# Patient Record
Sex: Male | Born: 1970 | Race: White | Hispanic: No | Marital: Married | Smoking: Never smoker
Health system: Southern US, Community
[De-identification: ages and names within clinical notes are randomized; demographics above are authoritative.]

## PROBLEM LIST (undated history)

## (undated) DIAGNOSIS — C7951 Secondary malignant neoplasm of bone: Secondary | ICD-10-CM

## (undated) DIAGNOSIS — C7931 Secondary malignant neoplasm of brain: Principal | ICD-10-CM

## (undated) DIAGNOSIS — K219 Gastro-esophageal reflux disease without esophagitis: Secondary | ICD-10-CM

## (undated) DIAGNOSIS — C7949 Secondary malignant neoplasm of other parts of nervous system: Principal | ICD-10-CM

---

## 2004-05-06 ENCOUNTER — Encounter: Admission: RE | Admit: 2004-05-06 | Discharge: 2004-05-06 | Payer: Self-pay | Admitting: Emergency Medicine

## 2007-02-20 ENCOUNTER — Ambulatory Visit: Payer: Self-pay | Admitting: Psychology

## 2007-03-01 ENCOUNTER — Ambulatory Visit: Payer: Self-pay | Admitting: Psychology

## 2007-03-14 ENCOUNTER — Ambulatory Visit: Payer: Self-pay | Admitting: Psychology

## 2007-03-30 ENCOUNTER — Ambulatory Visit: Payer: Self-pay | Admitting: Psychology

## 2007-04-18 ENCOUNTER — Ambulatory Visit: Payer: Self-pay | Admitting: Psychology

## 2007-04-24 ENCOUNTER — Ambulatory Visit: Payer: Self-pay | Admitting: Psychology

## 2007-05-11 ENCOUNTER — Ambulatory Visit: Payer: Self-pay | Admitting: Psychology

## 2007-05-25 ENCOUNTER — Ambulatory Visit: Payer: Self-pay | Admitting: Psychology

## 2007-06-08 ENCOUNTER — Ambulatory Visit: Payer: Self-pay | Admitting: Psychology

## 2007-06-29 ENCOUNTER — Ambulatory Visit: Payer: Self-pay | Admitting: Psychology

## 2007-07-20 ENCOUNTER — Ambulatory Visit: Payer: Self-pay | Admitting: Psychology

## 2007-08-03 ENCOUNTER — Ambulatory Visit: Payer: Self-pay | Admitting: Psychology

## 2007-08-31 ENCOUNTER — Ambulatory Visit: Payer: Self-pay | Admitting: Psychology

## 2007-09-28 ENCOUNTER — Ambulatory Visit: Payer: Self-pay | Admitting: Psychology

## 2007-10-12 ENCOUNTER — Ambulatory Visit: Payer: Self-pay | Admitting: Psychology

## 2007-10-26 ENCOUNTER — Ambulatory Visit: Payer: Self-pay | Admitting: Psychology

## 2007-11-08 ENCOUNTER — Ambulatory Visit: Payer: Self-pay | Admitting: Psychology

## 2007-12-07 ENCOUNTER — Ambulatory Visit: Payer: Self-pay | Admitting: Psychology

## 2008-01-02 ENCOUNTER — Ambulatory Visit: Payer: Self-pay | Admitting: Psychology

## 2008-01-18 ENCOUNTER — Ambulatory Visit: Payer: Self-pay | Admitting: Psychology

## 2008-02-28 ENCOUNTER — Ambulatory Visit: Payer: Self-pay | Admitting: Psychology

## 2008-03-28 ENCOUNTER — Ambulatory Visit: Payer: Self-pay | Admitting: Psychology

## 2008-04-09 ENCOUNTER — Ambulatory Visit: Payer: Self-pay | Admitting: Psychology

## 2008-04-25 ENCOUNTER — Ambulatory Visit: Payer: Self-pay | Admitting: Psychology

## 2008-05-09 ENCOUNTER — Ambulatory Visit: Payer: Self-pay | Admitting: Psychology

## 2008-05-21 ENCOUNTER — Ambulatory Visit: Payer: Self-pay | Admitting: Psychology

## 2008-06-06 ENCOUNTER — Ambulatory Visit: Payer: Self-pay | Admitting: Psychology

## 2008-07-04 ENCOUNTER — Ambulatory Visit: Payer: Self-pay | Admitting: Psychology

## 2008-07-18 ENCOUNTER — Ambulatory Visit: Payer: Self-pay | Admitting: Psychology

## 2008-08-29 ENCOUNTER — Ambulatory Visit: Payer: Self-pay | Admitting: Psychology

## 2008-10-24 ENCOUNTER — Ambulatory Visit: Payer: Self-pay | Admitting: Psychology

## 2009-01-18 ENCOUNTER — Ambulatory Visit (HOSPITAL_COMMUNITY): Admission: RE | Admit: 2009-01-18 | Discharge: 2009-01-18 | Payer: Self-pay | Admitting: Emergency Medicine

## 2009-06-05 ENCOUNTER — Ambulatory Visit: Payer: Self-pay | Admitting: Psychology

## 2009-06-19 ENCOUNTER — Ambulatory Visit: Payer: Self-pay | Admitting: Psychology

## 2009-07-28 ENCOUNTER — Ambulatory Visit: Payer: Self-pay | Admitting: Psychology

## 2009-08-07 ENCOUNTER — Ambulatory Visit: Payer: Self-pay | Admitting: Psychology

## 2009-08-21 ENCOUNTER — Ambulatory Visit: Payer: Self-pay | Admitting: Psychology

## 2009-09-11 ENCOUNTER — Ambulatory Visit: Payer: Self-pay | Admitting: Psychology

## 2009-09-25 ENCOUNTER — Ambulatory Visit: Payer: Self-pay | Admitting: Psychology

## 2009-10-23 ENCOUNTER — Ambulatory Visit: Payer: Self-pay | Admitting: Psychology

## 2009-11-06 ENCOUNTER — Ambulatory Visit: Payer: Self-pay | Admitting: Psychology

## 2009-12-04 ENCOUNTER — Ambulatory Visit: Payer: Self-pay | Admitting: Psychology

## 2009-12-18 ENCOUNTER — Ambulatory Visit: Payer: Self-pay | Admitting: Psychology

## 2010-01-15 ENCOUNTER — Ambulatory Visit: Admit: 2010-01-15 | Payer: Self-pay | Admitting: Psychology

## 2010-01-29 ENCOUNTER — Ambulatory Visit
Admission: RE | Admit: 2010-01-29 | Discharge: 2010-01-29 | Payer: Self-pay | Source: Home / Self Care | Attending: Psychology | Admitting: Psychology

## 2010-02-12 ENCOUNTER — Ambulatory Visit (INDEPENDENT_AMBULATORY_CARE_PROVIDER_SITE_OTHER): Payer: PRIVATE HEALTH INSURANCE | Admitting: Psychology

## 2010-02-12 DIAGNOSIS — F4320 Adjustment disorder, unspecified: Secondary | ICD-10-CM

## 2010-02-26 ENCOUNTER — Ambulatory Visit (INDEPENDENT_AMBULATORY_CARE_PROVIDER_SITE_OTHER): Payer: PRIVATE HEALTH INSURANCE | Admitting: Psychology

## 2010-02-26 DIAGNOSIS — F4320 Adjustment disorder, unspecified: Secondary | ICD-10-CM

## 2010-03-12 ENCOUNTER — Ambulatory Visit (INDEPENDENT_AMBULATORY_CARE_PROVIDER_SITE_OTHER): Payer: PRIVATE HEALTH INSURANCE | Admitting: Psychology

## 2010-03-12 DIAGNOSIS — F4320 Adjustment disorder, unspecified: Secondary | ICD-10-CM

## 2010-03-26 ENCOUNTER — Ambulatory Visit: Payer: PRIVATE HEALTH INSURANCE | Admitting: Psychology

## 2010-04-23 ENCOUNTER — Ambulatory Visit (INDEPENDENT_AMBULATORY_CARE_PROVIDER_SITE_OTHER): Payer: PRIVATE HEALTH INSURANCE | Admitting: Psychology

## 2010-04-23 DIAGNOSIS — F4320 Adjustment disorder, unspecified: Secondary | ICD-10-CM

## 2010-05-07 ENCOUNTER — Ambulatory Visit (INDEPENDENT_AMBULATORY_CARE_PROVIDER_SITE_OTHER): Payer: PRIVATE HEALTH INSURANCE | Admitting: Psychology

## 2010-05-07 DIAGNOSIS — F4320 Adjustment disorder, unspecified: Secondary | ICD-10-CM

## 2010-06-04 ENCOUNTER — Ambulatory Visit (INDEPENDENT_AMBULATORY_CARE_PROVIDER_SITE_OTHER): Payer: PRIVATE HEALTH INSURANCE | Admitting: Psychology

## 2010-06-04 ENCOUNTER — Ambulatory Visit: Payer: PRIVATE HEALTH INSURANCE | Admitting: Psychology

## 2010-06-04 DIAGNOSIS — F4320 Adjustment disorder, unspecified: Secondary | ICD-10-CM

## 2010-06-18 ENCOUNTER — Ambulatory Visit: Payer: PRIVATE HEALTH INSURANCE | Admitting: Psychology

## 2010-07-02 ENCOUNTER — Ambulatory Visit: Payer: PRIVATE HEALTH INSURANCE | Admitting: Psychology

## 2010-07-16 ENCOUNTER — Ambulatory Visit (INDEPENDENT_AMBULATORY_CARE_PROVIDER_SITE_OTHER): Payer: PRIVATE HEALTH INSURANCE | Admitting: Psychology

## 2010-07-16 DIAGNOSIS — F4320 Adjustment disorder, unspecified: Secondary | ICD-10-CM

## 2010-07-30 ENCOUNTER — Ambulatory Visit: Payer: PRIVATE HEALTH INSURANCE | Admitting: Psychology

## 2010-08-06 ENCOUNTER — Ambulatory Visit (INDEPENDENT_AMBULATORY_CARE_PROVIDER_SITE_OTHER): Payer: PRIVATE HEALTH INSURANCE | Admitting: Psychology

## 2010-08-06 DIAGNOSIS — F4320 Adjustment disorder, unspecified: Secondary | ICD-10-CM

## 2010-08-13 ENCOUNTER — Ambulatory Visit: Payer: PRIVATE HEALTH INSURANCE | Admitting: Psychology

## 2010-08-20 ENCOUNTER — Ambulatory Visit (INDEPENDENT_AMBULATORY_CARE_PROVIDER_SITE_OTHER): Payer: PRIVATE HEALTH INSURANCE | Admitting: Psychology

## 2010-08-20 DIAGNOSIS — F4320 Adjustment disorder, unspecified: Secondary | ICD-10-CM

## 2010-08-27 ENCOUNTER — Ambulatory Visit: Payer: PRIVATE HEALTH INSURANCE | Admitting: Psychology

## 2010-09-17 ENCOUNTER — Ambulatory Visit: Payer: PRIVATE HEALTH INSURANCE | Admitting: Psychology

## 2010-11-05 ENCOUNTER — Ambulatory Visit: Payer: Self-pay | Admitting: Psychology

## 2010-11-19 ENCOUNTER — Ambulatory Visit: Payer: PRIVATE HEALTH INSURANCE | Admitting: Psychology

## 2010-12-03 ENCOUNTER — Ambulatory Visit: Payer: PRIVATE HEALTH INSURANCE | Admitting: Psychology

## 2013-01-21 ENCOUNTER — Other Ambulatory Visit: Payer: Self-pay | Admitting: Family Medicine

## 2013-01-21 ENCOUNTER — Ambulatory Visit
Admission: RE | Admit: 2013-01-21 | Discharge: 2013-01-21 | Disposition: A | Discharge status: Home / Self Care | Payer: BC Managed Care – PPO | Source: Ambulatory Visit | Attending: Family Medicine | Admitting: Family Medicine

## 2013-01-21 DIAGNOSIS — M549 Dorsalgia, unspecified: Secondary | ICD-10-CM

## 2013-01-23 ENCOUNTER — Other Ambulatory Visit: Payer: Self-pay | Admitting: Family Medicine

## 2013-01-23 DIAGNOSIS — M549 Dorsalgia, unspecified: Secondary | ICD-10-CM

## 2013-01-28 ENCOUNTER — Ambulatory Visit
Admission: RE | Admit: 2013-01-28 | Discharge: 2013-01-28 | Disposition: A | Discharge status: Home / Self Care | Payer: BC Managed Care – PPO | Source: Ambulatory Visit | Attending: Family Medicine | Admitting: Family Medicine

## 2013-01-28 DIAGNOSIS — M549 Dorsalgia, unspecified: Secondary | ICD-10-CM

## 2013-01-29 ENCOUNTER — Encounter: Payer: Self-pay | Admitting: *Deleted

## 2013-01-29 ENCOUNTER — Other Ambulatory Visit (HOSPITAL_COMMUNITY): Payer: Self-pay | Admitting: Family Medicine

## 2013-01-29 DIAGNOSIS — C799 Secondary malignant neoplasm of unspecified site: Secondary | ICD-10-CM

## 2013-01-29 NOTE — Progress Notes (Signed)
Received referral on pt today.  Looked at CT scan and notified Dr. Julien Nordmann.  He stated pt needed to be seen soon.  I spoke with Rad Onc, Dr. [name] and he wanted to see pt at [facility].  Dr. [name] requested MRI Thoracic spine be scheduled asap.  I called [name] and was able to get scan 2 days earlier.  Dr. Julien Nordmann wanted PA to order Decadron and no heavy lifting for pt.  Message relayed.

## 2013-01-30 ENCOUNTER — Other Ambulatory Visit: Payer: Self-pay | Admitting: Radiation Therapy

## 2013-01-30 ENCOUNTER — Telehealth: Payer: Self-pay | Admitting: *Deleted

## 2013-01-30 ENCOUNTER — Other Ambulatory Visit: Payer: Self-pay | Admitting: Radiology

## 2013-01-30 ENCOUNTER — Other Ambulatory Visit: Payer: Self-pay | Admitting: Internal Medicine

## 2013-01-30 DIAGNOSIS — K769 Liver disease, unspecified: Secondary | ICD-10-CM

## 2013-01-30 DIAGNOSIS — C7931 Secondary malignant neoplasm of brain: Secondary | ICD-10-CM

## 2013-01-30 DIAGNOSIS — C7949 Secondary malignant neoplasm of other parts of nervous system: Principal | ICD-10-CM

## 2013-01-30 NOTE — Telephone Encounter (Signed)
Called [facility] and was able to move pt appt for MRI thoracic spine to an earlier appt per request of Mont Dutton, radiation navigator.  Appt set for Sat 02/02/13 at 10:45 arrive at 10:15.  He verbalized understanding of time and location of appt.   Also, clarified appt for [facility] 01/31/13 to see Dr. Julien Nordmann and Dr. [name].  He verbalized understanding of time and place of appt.

## 2013-01-31 ENCOUNTER — Encounter (HOSPITAL_COMMUNITY): Payer: Self-pay | Admitting: Pharmacy Technician

## 2013-01-31 ENCOUNTER — Telehealth: Payer: Self-pay | Admitting: Oncology

## 2013-01-31 ENCOUNTER — Ambulatory Visit (HOSPITAL_BASED_OUTPATIENT_CLINIC_OR_DEPARTMENT_OTHER): Payer: BC Managed Care – PPO | Admitting: Internal Medicine

## 2013-01-31 ENCOUNTER — Encounter: Payer: Self-pay | Admitting: Internal Medicine

## 2013-01-31 ENCOUNTER — Encounter (HOSPITAL_COMMUNITY): Payer: Self-pay

## 2013-01-31 ENCOUNTER — Ambulatory Visit: Payer: BC Managed Care – PPO | Admitting: Physical Therapy

## 2013-01-31 ENCOUNTER — Ambulatory Visit
Admission: RE | Admit: 2013-01-31 | Discharge: 2013-01-31 | Disposition: A | Discharge status: Home / Self Care | Payer: No Typology Code available for payment source | Source: Ambulatory Visit | Attending: Family Medicine | Admitting: Family Medicine

## 2013-01-31 ENCOUNTER — Ambulatory Visit (HOSPITAL_COMMUNITY)
Admission: RE | Admit: 2013-01-31 | Discharge: 2013-01-31 | Disposition: A | Discharge status: Home / Self Care | Payer: BC Managed Care – PPO | Source: Ambulatory Visit | Attending: Radiation Oncology | Admitting: Radiation Oncology

## 2013-01-31 ENCOUNTER — Ambulatory Visit
Admission: RE | Admit: 2013-01-31 | Discharge: 2013-01-31 | Disposition: A | Discharge status: Home / Self Care | Payer: PRIVATE HEALTH INSURANCE | Source: Ambulatory Visit | Attending: Radiation Oncology | Admitting: Radiation Oncology

## 2013-01-31 ENCOUNTER — Encounter: Payer: Self-pay | Admitting: Radiation Oncology

## 2013-01-31 VITALS — BP 158/94 | HR 121 | Temp 97.7°F | Resp 18 | Ht 71.0 in | Wt 282.3 lb

## 2013-01-31 DIAGNOSIS — M546 Pain in thoracic spine: Secondary | ICD-10-CM | POA: Insufficient documentation

## 2013-01-31 DIAGNOSIS — R599 Enlarged lymph nodes, unspecified: Secondary | ICD-10-CM

## 2013-01-31 DIAGNOSIS — C155 Malignant neoplasm of lower third of esophagus: Secondary | ICD-10-CM | POA: Insufficient documentation

## 2013-01-31 DIAGNOSIS — M949 Disorder of cartilage, unspecified: Principal | ICD-10-CM

## 2013-01-31 DIAGNOSIS — C799 Secondary malignant neoplasm of unspecified site: Secondary | ICD-10-CM

## 2013-01-31 DIAGNOSIS — C7949 Secondary malignant neoplasm of other parts of nervous system: Secondary | ICD-10-CM

## 2013-01-31 DIAGNOSIS — C787 Secondary malignant neoplasm of liver and intrahepatic bile duct: Secondary | ICD-10-CM | POA: Insufficient documentation

## 2013-01-31 DIAGNOSIS — C7951 Secondary malignant neoplasm of bone: Secondary | ICD-10-CM | POA: Insufficient documentation

## 2013-01-31 DIAGNOSIS — R911 Solitary pulmonary nodule: Secondary | ICD-10-CM | POA: Insufficient documentation

## 2013-01-31 DIAGNOSIS — R918 Other nonspecific abnormal finding of lung field: Secondary | ICD-10-CM

## 2013-01-31 DIAGNOSIS — C801 Malignant (primary) neoplasm, unspecified: Secondary | ICD-10-CM

## 2013-01-31 DIAGNOSIS — M899 Disorder of bone, unspecified: Secondary | ICD-10-CM | POA: Insufficient documentation

## 2013-01-31 DIAGNOSIS — K449 Diaphragmatic hernia without obstruction or gangrene: Secondary | ICD-10-CM | POA: Insufficient documentation

## 2013-01-31 DIAGNOSIS — K7689 Other specified diseases of liver: Secondary | ICD-10-CM

## 2013-01-31 DIAGNOSIS — R609 Edema, unspecified: Secondary | ICD-10-CM

## 2013-01-31 DIAGNOSIS — M549 Dorsalgia, unspecified: Secondary | ICD-10-CM

## 2013-01-31 DIAGNOSIS — C7931 Secondary malignant neoplasm of brain: Secondary | ICD-10-CM

## 2013-01-31 DIAGNOSIS — C7800 Secondary malignant neoplasm of unspecified lung: Secondary | ICD-10-CM

## 2013-01-31 DIAGNOSIS — C7952 Secondary malignant neoplasm of bone marrow: Secondary | ICD-10-CM

## 2013-01-31 HISTORY — DX: Secondary malignant neoplasm of brain: C79.31

## 2013-01-31 HISTORY — DX: Secondary malignant neoplasm of other parts of nervous system: C79.49

## 2013-01-31 MED ORDER — GADOBENATE DIMEGLUMINE 529 MG/ML IV SOLN
20.0000 mL | Freq: Once | INTRAVENOUS | Status: AC | PRN
  Administered 2013-01-31: 20 mL via INTRAVENOUS

## 2013-01-31 MED ORDER — OXYCODONE-ACETAMINOPHEN 5-325 MG PO TABS: 1.0000 | ORAL_TABLET | Freq: Four times a day (QID) | ORAL | Status: DC | PRN

## 2013-01-31 MED ORDER — IOHEXOL 300 MG/ML  SOLN
80.0000 mL | Freq: Once | INTRAMUSCULAR | Status: AC | PRN
  Administered 2013-01-31: 80 mL via INTRAVENOUS

## 2013-01-31 NOTE — Progress Notes (Signed)
[place] [phone]   [phone] Multidisciplinary thoracic oncology clinic ([place])  CONSULT NOTE  REFERRING PHYSICIAN: Dr. Jonathon Jordan  REASON FOR CONSULTATION:  43 years old white male with questionable metastatic carcinoma.  HPI Fred [name] is a 43 y.o. male with past medical history significant for depression/anxiety, history of left breast gynecomastia an ultrasound in 2012 showed benign features and currently resolved, history of kidney stone as well as the degenerative disc disease. The patient has been complaining of back pain for a month which gets worse when he bent over to tie his shoe. He was seen by his primary care physician and was treated with NSAIDs and the steroid shots with minimal improvement. X-ray of the thoracolumbar spine showed degenerative disc disease and questionable renal stones. He had CT of the abdomen and pelvis performed on 01/28/2013 for evaluation of the kidney stones. It showed lytic mass in the T9 vertebral body with mild associated pathologic compression most consistent with metastatic disease. There was also pulmonary nodules in the lower lobes and low attenuation lesion in the liver that are new from 01/18/2005 and worrisome for metastatic disease. The patient was referred to the multidisciplinary thoracic oncology clinic today for further evaluation. Will order CT scan of the chest as well as MRI of the thoracic spine which were performed earlier today. CT scan of the chest on 01/31/2013 showed pathologically enlarged mediastinal lymph nodes measuring up to 1.8 CM and the prevascular/AP window station. There was also a left hilar node measuring 1.2 CM. There appears to be an eccentric  nodular lesion in the 7-8 o'clock position of the lower esophagus, measuring approximately 1.5 x 1.8 cm with some associated eccentric thickening of the esophageal wall. There was also a vague area of groundglass nodularity in the  lingula measuring 1.1 x 1.3 CM. There are 2 nodular densities in the left lower lobe measuring up to 0.9 x 1.1 CM and a tiny nodular density along the right hemidiaphragm. Incidental imaging of the upper abdomen shows at least 4 heterogeneous low-attenuation lesions in the dome of the liver and left hepatic lobe, measuring up to approximately 1.6 x 2.4 cm. Gastrohepatic ligament lymph node measures 10 mm (3 mm on 01/18/2009). A lytic mass in the right aspect of T9 measures approximately 3.0 x 4.0 cm and encroaches posteriorly on the spinal canal. Mild associated pathologic compression. MRI of the thoracic spine performed on 01/31/2013 showed a destructive soft tissue mass involving the right half of the T9 vertebral body and right pedicle. This mass measures 4.3 x 2.8 by 2.1 cm (AP by transverse by craniocaudal). This mass disrupts the cortex laterally and extends into the paravertebral tissues and also disrupts the cortex posteriorly extending into the spinal canal. There is at least moderate spinal stenosis at this level with impression on the right ventrolateral spinal cord. No abnormal spinal cord signal is present to suggest  Edema. There was also abnormal marrow signal and enhancement in the right T5 transverse process, also concerning for metastasis. The patient is here today for evaluation and discussion of his condition and treatment options. He was very anxious today and still complaining of back pain rated as 4 on a scale from 1-10. He also lost around 9 pounds in the last few weeks. He denied having any significant chest pain, shortness breath, cough or hemoptysis. He denied having any headache or blurry vision.  PAST MEDICAL HISTORY: depression/anxiety, gynecomastia left breast, obesity, kidney stone, and degenerative  disc disease. The patient denied having any history of diabetes mellitus, hypertension, coronary artery disease or stroke.  FAMILY HISTORY: Family history significant for a mother  who was diagnosed with dementia and died at age 46. Father is still alive and has peripheral vascular disease. There is no family history of malignancy.  SOCIAL HISTORY: The patient is married and has one daughter age 58. He was accompanied by his wife, Fred [name], today. 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 He is a never smoker but chewed tobacco and quit recently. She has no history of drug abuse but drinks alcohol occasionally.   HPI  No Known Allergies  Current Outpatient Prescriptions  Medication Sig Dispense Refill  . dexamethasone (DECADRON) 4 MG tablet Take 4 mg by mouth 4 (four) times daily.      Marland Kitchen tiZANidine (ZANAFLEX) 4 MG tablet Take 4 mg by mouth every 6 (six) hours as needed for muscle spasms.      Marland Kitchen ibuprofen (ADVIL,MOTRIN) 200 MG tablet Take 200 mg by mouth every 6 (six) hours as needed.      . naproxen sodium (ANAPROX) 220 MG tablet Take 220 mg by mouth 2 (two) times daily as needed (pain).      Marland Kitchen oxyCODONE-acetaminophen (PERCOCET/ROXICET) 5-325 MG per tablet Take 1 tablet by mouth every 6 (six) hours as needed for severe pain.  60 tablet  0   No current facility-administered medications for this visit.    Review of Systems  Constitutional: negative Eyes: negative Ears, nose, mouth, throat, and face: negative Respiratory: negative Cardiovascular: negative Gastrointestinal: negative Genitourinary:negative Integument/breast: negative Hematologic/lymphatic: negative Musculoskeletal:positive for back pain Neurological: negative Behavioral/Psych: negative Endocrine: negative Allergic/Immunologic: negative  Physical Exam  EGB:TDVVO, healthy, no distress, well nourished, well developed and anxious SKIN: skin color, texture, turgor are normal, no rashes or significant lesions HEAD: Normocephalic, No masses, lesions, tenderness or abnormalities EYES: normal, PERRLA EARS: External ears normal OROPHARYNX:no exudate, no erythema and  lips, buccal mucosa, and tongue normal  NECK: supple, no adenopathy, no JVD LYMPH:  no palpable lymphadenopathy, no hepatosplenomegaly LUNGS: clear to auscultation , and palpation HEART: regular rate & rhythm, no murmurs and no gallops ABDOMEN:abdomen soft, non-tender, obese, normal bowel sounds and no masses or organomegaly BACK: Back symmetric, no curvature., No CVA tenderness EXTREMITIES:no joint deformities, effusion, or inflammation, no edema, no skin discoloration  NEURO: alert & oriented x 3 with fluent speech, no focal motor/sensory deficits  PERFORMANCE STATUS: ECOG 1  LABORATORY DATA: No results found for this basename: WBC, HGB, HCT, MCV, PLT      Chemistry   No results found for this basename: NA, K, CL, CO2, BUN, CREATININE, GLU   No results found for this basename: CALCIUM, ALKPHOS, AST, ALT, BILITOT       RADIOGRAPHIC STUDIES: Ct Abdomen Pelvis Wo Contrast  01/28/2013   CLINICAL DATA:  Right-sided back pain. Renal stones questioned on recent lumbar spine series.  EXAM: CT ABDOMEN AND PELVIS WITHOUT CONTRAST  TECHNIQUE: Multidetector CT imaging of the abdomen and pelvis was performed following the standard protocol without intravenous contrast.  COMPARISON:  DG LUMBAR SPINE COMPLETE dated 01/21/2013; CT ANGIO CHEST W/CM &/OR WO/CM dated 01/18/2009  FINDINGS: Lung bases show a 5 mm subpleural nodule in the medial right lower lobe (image 8) and a 10 mm nodule in the left lower lobe (image 5), new from 01/18/2009. Heart size normal. No pericardial or pleural effusion. Small hiatal hernia with possible associated thickening. Difficult to exclude  a mass (example image 9).  There is an ovoid area of low attenuation in the dome of the liver, measuring 1.8 x 3.8 cm (image 14), not well seen on 01/18/2009. Liver, gallbladder and adrenal glands are otherwise unremarkable. Small stones are seen in the lower pole right kidney. Prominent extrarenal pelvis and mild caliectasis on the right.  There is abrupt transition to normal caliber right ureter at the ureteropelvic junction (coronal image 75), where there appears to be a crossing vessel (coronal image 73). Kidneys, spleen, pancreas, stomach and bowel, including appendix are unremarkable.  Gastrohepatic ligament lymph nodes measure up to 11 mm (image 18). Additional scattered lymph nodes in the abdomen and pelvis are sub cm in short axis size. Prostate is normal in size. Small bilateral inguinal hernias contain fat. No free fluid.  There is a lytic lesion in the lateral aspect of the T9 vertebral body, measuring approximately 2.9 x 3.9 cm. Slight compression of the lateral aspect of the T9 vertebral body in association (coronal image 97 and sagittal image 86). No additional worrisome lytic or sclerotic lesions.  IMPRESSION: 1. Lytic mass in the T9 vertebral body with mild associated pathologic compression. Findings correlate with the patient's history of right sided back pain and are most consistent with metastatic disease. 2. Pulmonary nodules in the lower lobes and a low-attenuation lesion in the liver are new from 01/18/2009, also worrisome for metastatic disease. 3. Small hiatal hernia with wall thickening. Difficult to exclude a mass. Borderline enlarged gastrohepatic ligament lymph node. 4. These results were called by telephone at the time of interpretation on 01/28/2013 at 2:54 PM to Dr. Jonathon Jordan , who verbally acknowledged these results. 5. Right renal stones with right pelvocaliectasis. Abrupt transition to normal caliber proximal right ureter may be due to a crossing vessel. No perinephric stranding to indicate acute obstruction. 6. Small bilateral inguinal hernias contain fat.   Electronically Signed   By: Lorin Picket M.D.   On: 01/28/2013 14:54   Dg Thoracic Spine W/swimmers  01/21/2013   CLINICAL DATA:  Back pain.  EXAM: THORACIC SPINE - 2 VIEW + SWIMMERS  COMPARISON:  None.  FINDINGS: Alignment is anatomic. Vertebral body  height is maintained. Mild scattered endplate degenerative changes. Cervicothoracic junction is in alignment.  IMPRESSION: Mild degenerative change.  No acute findings.   Electronically Signed   By: Lorin Picket M.D.   On: 01/21/2013 14:55   Dg Lumbar Spine Complete  01/21/2013   CLINICAL DATA:  Pain.  EXAM: LUMBAR SPINE - COMPLETE 4+ VIEW  COMPARISON:  None.  FINDINGS: Alignment is anatomic. Vertebral body height is maintained. Mild scattered endplate degenerative changes. Slight loss of disc space height at L2-3. Mild facet sclerosis in the lower lumbar spine. No definite pars defects. Calcifications project over the lower pole right kidney.  IMPRESSION: 1. Mild scattered spondylosis. 2. Probable right renal stones.   Electronically Signed   By: Lorin Picket M.D.   On: 01/21/2013 14:56   Ct Chest W Contrast  01/31/2013   CLINICAL DATA:  T9 the lytic mass with possible pulmonary metastases on recent CT abdomen.  EXAM: CT CHEST WITH CONTRAST  TECHNIQUE: Multidetector CT imaging of the chest was performed during intravenous contrast administration.  CONTRAST:  15mL OMNIPAQUE IOHEXOL 300 MG/ML  SOLN  COMPARISON:  CT ABD/PELV WO CM dated 01/28/2013; CT ANGIO CHEST W/CM &/OR WO/CM dated 01/18/2009  FINDINGS: There are pathologically enlarged mediastinal lymph nodes measuring up to 1.8 cm in the prevascular/ AP  window station (image 17). Left hilar lymph node measures 1.2 cm. No axillary adenopathy. Heart size normal. No pericardial effusion.  Small hiatal hernia is again noted. There appears to be an eccentric nodular lesion in the 7-8 o'clock position of the lower esophagus, measuring approximately 1.5 x 1.8 cm with some associated eccentric thickening of the esophageal wall (images 36-39).  A vague area of ground-glass nodularity in the lingula measures 11 x 13 mm (image 24), new from 01/18/2009. There are 2 nodular densities in the left lower lobe, measuring up to 9 x 11 mm (image 38), also new. A tiny  nodular density along the right hemidiaphragm (image 40) is new as well. No pleural fluid. Airway is unremarkable.  Incidental imaging of the upper abdomen shows at least 4 heterogeneous low-attenuation lesions in the dome of the liver and left hepatic lobe, measuring up to approximately 1.6 x 2.4 cm (image 40). Gastrohepatic ligament lymph node measures 10 mm (3 mm on 01/18/2009).  A lytic mass in the right aspect of T9 measures approximately 3.0 x 4.0 cm and encroaches posteriorly on the spinal canal. Mild associated pathologic compression.  IMPRESSION: 1. Suspect a distal esophageal mass and/or eccentric thickening with borderline enlarged gastrohepatic ligament lymph node, pathologically enlarged mediastinal and left hilar lymph nodes, new pulmonary nodules, T9 metastasis and hepatic metastatic disease. 2. 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 Per clinic notes, patient is scheduled for MR thoracic spine 02/02/2013 and is being followed by the multidisciplinary thoracic Oncology Clinic.   Electronically Signed   By: Lorin Picket M.D.   On: 01/31/2013 13:08   Mr Thoracic Spine W Wo Contrast  01/31/2013   CLINICAL DATA:  Low back pain. Evaluate for metastatic disease. T9 mass on recent CT.  EXAM: MRI THORACIC SPINE WITHOUT AND WITH CONTRAST  TECHNIQUE: Multiplanar and multiecho pulse sequences of the thoracic spine were obtained without and with intravenous contrast.  CONTRAST:  62mL MULTIHANCE GADOBENATE DIMEGLUMINE 529 MG/ML IV SOLN  COMPARISON:  Chest CT 01/31/2013  FINDINGS: Vertebral alignment is normal. There is no evidence of compression fracture. There is a destructive soft tissue mass involving the right half of the T9 vertebral body and right pedicle. This mass measures 4.3 x 2.8 by 2.1 cm (AP by transverse by craniocaudal). This mass disrupts the cortex laterally and extends into the paravertebral tissues and also disrupts the cortex posteriorly extending into the spinal canal.  There is at least moderate spinal stenosis at this level with impression on the right ventrolateral spinal cord. No abnormal spinal cord signal is present to suggest edema.  There is also abnormal marrow signal and intense enhancement which appears to involve the right transverse process at T5. Mild edema and enhancement within the anterosuperior corner of the T10 vertebral body may be reactive. Small focus of enhancement and T2 signal abnormality in the posterior T10 vertebral body abutting the superior endplate is most suggestive of a Schmorl's node. The thoracic spinal cord is normal in signal. There is no evidence of disc herniation.  IMPRESSION: 1. Destructive right T9 mass with epidural tumor extension and moderate spinal stenosis. No evidence of spinal cord edema. 2. Abnormal marrow signal and enhancement in the right T5 transverse process, also concerning for metastasis.   Electronically Signed   By: Logan Bores   On: 01/31/2013 15:35    ASSESSMENT: This is a very pleasant 43 years old white male with questionable metastatic carcinoma of unknown primary at this point, presented with small  pulmonary nodules as well as mediastinal lymphadenopathy and liver as well as bone lesions. There is some thickening in the distal esophagus concerning for esophageal carcinoma but other primary malignancy cannot be excluded at this point.   PLAN: I had a lengthy discussion with the patient and his wife today about his current condition and further evaluation to confirm a diagnosis. I will arrange for the patient to have a PET scan performed to complete the staging workup of his disease. I would also referred the patient to interventional radiology for consideration of ultrasound guided core biopsy of one of the liver lesion and is not accessible we may consider CT-guided biopsy of the T9 lytic lesion for confirmation of the tissue diagnosis. We started the patient on Decadron currently 4 mg by mouth 4 times a day  and this will be tapered slowly by Dr. [name] during his radiation treatment. The patient was referred to Dr. [name] for evaluation and consideration of palliative radiotherapy to the T9 lesion. He was also seen by Dr. Vertell Limber for evaluation and consideration of surgical intervention to stabilize his spine. For pain management, I started the patient on Percocet 5/325 mg by mouth every 6 hours as needed for pain. He would come back for follow up visit in one week for evaluation and discussion of his treatment options based on the final pathology and staging workup. I gave the patient and his wife the time to ask questions and I answered them completely to their satisfaction. The patient and his wife were very overwhelmed with the new diagnosis as well as the several scans and testings, but we are very appreciative of the speedy and compression of care. The patient was seen in the multidisciplinary clinic today by the thoracic navigator, the social worker at the [facility] as well as the physical therapist, in addition to medical oncology, radiation oncology as well as neurosurgery. The patient voices understanding of current disease status and treatment options and is in agreement with the current care plan.  All questions were answered. The patient knows to call the clinic with any problems, questions or concerns. We can certainly see the patient much sooner if necessary.  Thank you so much for allowing me to participate in the care of Fred [name]. I will continue to follow up the patient with you and assist in his care.  I spent 50 minutes counseling the patient face to face. The total time spent in the appointment was 65 minutes.  Disclaimer: This note was dictated with voice recognition software. Similar sounding words can inadvertently be transcribed and may not be corrected upon review.   Millisa Giarrusso K. 01/31/2013, 5:27 PM

## 2013-01-31 NOTE — Telephone Encounter (Signed)
Gave pt appt for lab  and PEt for 02/08/12

## 2013-01-31 NOTE — Progress Notes (Signed)
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  HISTORY OF PRESENT ILLNESS::Fred [name] is a 43 y.o. male who presented with back pain starting in December up to 4-5/10 pain.  Marland Kitchen He underwent plain film x-rays of the spine. These appeared to show some calcifications projecting over the lower pole the right kidney suggestive of probable right renal stones. Consequently, he underwent abdominal CT on January 26. 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 In addition, patient was noted to have small pulmonary nodules and lower lobes as well as a low-attenuation lesion in the liver worrisome for metastatic disease.  There was also some enlargement of the distal esophagus suggesting a small hiatal hernia versus mass. The patient's primary care physician referred the patient to our multidisciplinary thoracic clinic. Prior to presentation, we obtained CT of the chest and thoracic MRI earlier today.  The chest CT seems to further suggest a distal esophageal mass with eccentric thickening and borderline enlarged gastrohepatic ligament lymph node. The pulmonary nodules were again noted along with left hilar adenopathy. The T9 metastasis was better characterize to extend posteriorly into the spinal canal. Thoracic spine MRI confirmed a 4.3x2.8x2.1 cm destructive right T9 mass with epidural tumor extension and moderate spinal stenosis. There was no evidence of spinal cord edema. Also noted was abnormal marrow signal and enhancement  in the right T5 transverse process concerning for metastasis. At this time, the patient is scheduled for biopsy with interventional radiology tomorrow. His films were reviewed in our multidisciplinary conference. Today, he is being seen by myself and medical oncology.  Dr. Erline Levine from neurosurgery was also available to participate in the clinical evaluation in the multidisciplinary thoracic oncology clinic.  PREVIOUS RADIATION THERAPY: No  PAST MEDICAL HISTORY: No previous medical problems    PAST SURGICAL HISTORY: No previous surgeries  FAMILY HISTORY: family history is not on file.  SOCIAL HISTORY:  reports that he has never smoked. He does not have any smokeless tobacco history on file. the patient has used oral tobacco for several years.  ALLERGIES: Review of patient's allergies indicates no known allergies.  MEDICATIONS:  Current Outpatient Prescriptions  Medication Sig Dispense Refill  . dexamethasone (DECADRON) 4 MG tablet Take 4 mg by mouth 4 (four) times daily.      Marland Kitchen ibuprofen (ADVIL,MOTRIN) 200 MG tablet Take 200 mg by mouth every 6 (six) hours as needed.      . naproxen sodium (ANAPROX) 220 MG tablet Take 220 mg by mouth 2 (two) times daily as needed (pain).      Marland Kitchen oxyCODONE-acetaminophen (PERCOCET/ROXICET) 5-325 MG per tablet Take 1 tablet by mouth every 6 (six) hours as needed for severe pain.  60 tablet  0  . tiZANidine (ZANAFLEX) 4 MG tablet Take 4 mg by mouth every 6 (six) hours as needed for muscle spasms.       No current facility-administered medications for this encounter.    REVIEW OF SYSTEMS:  A 15 point review of systems is documented in the electronic medical record. This was obtained by the nursing  staff. However, I reviewed this with the patient to discuss relevant findings and make appropriate changes.  Pertinent items are noted in HPI.   PHYSICAL EXAM:  The patient is a very nice gentleman in no acute distress today. He is alert and oriented. His head is  normocephalic and atraumatic.  His respiratory effort is unremarkable. His speech is fluent articulate. Motor strength is intact throughout. Gait is unremarkable. Mood and affect are appropriate. Dr. Vertell Limber examined the patient during my face-to-face visit and performed a neurologic exam which was nonfocal.  KPS = 90  100 - Normal; no complaints; no evidence of disease. 90   - Able to carry on normal activity; minor signs or symptoms of disease. 80   - Normal activity with effort; some signs or symptoms of disease. 34   - Cares for self; unable to carry on normal activity or to do active work. 60   - Requires occasional assistance, but is able to care for most of his personal needs. 50   - Requires considerable assistance and frequent medical care. 71   - Disabled; requires special care and assistance. 65   - Severely disabled; hospital admission is indicated although death not imminent. 62   - Very sick; hospital admission necessary; active supportive treatment necessary. 10   - Moribund; fatal processes progressing rapidly. 0     - Dead  Karnofsky DA, Abelmann WH, Craver LS and Burchenal JH [phone]) The use of the nitrogen mustards in the palliative treatment of carcinoma: with particular reference to bronchogenic carcinoma Cancer 1 [id number]  LABORATORY DATA:  No results found for this basename: WBC,  HGB,  HCT,  MCV,  PLT   No results found for this basename: NA,  K,  CL,  CO2   No results found for this basename: ALT,  AST,  GGT,  ALKPHOS,  BILITOT    PULMONARY FUNCTION TEST:  Not applicable   RADIOGRAPHY: Ct Abdomen Pelvis Wo Contrast  01/28/2013   CLINICAL DATA:  Right-sided back pain. Renal stones questioned on recent lumbar spine series.  EXAM: CT ABDOMEN AND PELVIS WITHOUT CONTRAST  TECHNIQUE: Multidetector CT imaging of the abdomen and pelvis was performed following the standard protocol without intravenous contrast.  COMPARISON:  DG LUMBAR SPINE COMPLETE dated 01/21/2013; CT ANGIO  CHEST W/CM &/OR WO/CM dated 01/18/2009  FINDINGS: Lung bases show a 5 mm subpleural nodule in the medial right lower lobe (image 8) and a 10 mm nodule in the left lower lobe (image 5), new from 01/18/2009. Heart size normal. No pericardial or pleural effusion. Small hiatal hernia with possible associated thickening. Difficult to exclude a mass (example image 9).  There is an ovoid area of low attenuation in the dome of the liver, measuring 1.8 x 3.8 cm (image 14), not well seen on 01/18/2009. Liver, gallbladder and adrenal glands are otherwise unremarkable. Small stones are seen in the lower pole right kidney. Prominent extrarenal pelvis and mild caliectasis on the right. There is abrupt transition to normal caliber right ureter at the ureteropelvic junction (coronal image 75), where there appears to be a crossing vessel (coronal image 73). Kidneys, spleen, pancreas, stomach and bowel, including appendix are unremarkable.  Gastrohepatic ligament lymph nodes measure up to 11 mm (image 18). Additional scattered lymph nodes in the abdomen and pelvis are sub cm in short axis size. Prostate is normal in size. Small bilateral inguinal hernias contain fat. No free fluid.  There is a lytic lesion in the lateral aspect of  the T9 vertebral body, measuring approximately 2.9 x 3.9 cm. Slight compression of the lateral aspect of the T9 vertebral body in association (coronal image 97 and sagittal image 86). No additional worrisome lytic or sclerotic lesions.  IMPRESSION: 1. Lytic mass in the T9 vertebral body with mild associated pathologic compression. Findings correlate with the patient's history of right sided back pain and are most consistent with metastatic disease. 2. Pulmonary nodules in the lower lobes and a low-attenuation lesion in the liver are new from 01/18/2009, also worrisome for metastatic disease. 3. Small hiatal hernia with wall thickening. Difficult to exclude a mass. Borderline enlarged gastrohepatic ligament  lymph node. 4. These results were called by telephone at the time of interpretation on 01/28/2013 at 2:54 PM to Dr. Jonathon Jordan , who verbally acknowledged these results. 5. Right renal stones with right pelvocaliectasis. Abrupt transition to normal caliber proximal right ureter may be due to a crossing vessel. No perinephric stranding to indicate acute obstruction. 6. Small bilateral inguinal hernias contain fat.   Electronically Signed   By: Fred Picket M.D.   On: 01/28/2013 14:54   Dg Thoracic Spine W/swimmers  01/21/2013   CLINICAL DATA:  Back pain.  EXAM: THORACIC SPINE - 2 VIEW + SWIMMERS  COMPARISON:  None.  FINDINGS: Alignment is anatomic. Vertebral body height is maintained. Mild scattered endplate degenerative changes. Cervicothoracic junction is in alignment.  IMPRESSION: Mild degenerative change.  No acute findings.   Electronically Signed   By: Fred Picket M.D.   On: 01/21/2013 14:55   Dg Lumbar Spine Complete  01/21/2013   CLINICAL DATA:  Pain.  EXAM: LUMBAR SPINE - COMPLETE 4+ VIEW  COMPARISON:  None.  FINDINGS: Alignment is anatomic. Vertebral body height is maintained. Mild scattered endplate degenerative changes. Slight loss of disc space height at L2-3. Mild facet sclerosis in the lower lumbar spine. No definite pars defects. Calcifications project over the lower pole right kidney.  IMPRESSION: 1. Mild scattered spondylosis. 2. Probable right renal stones.   Electronically Signed   By: Fred Picket M.D.   On: 01/21/2013 14:56   Ct Chest W Contrast  01/31/2013   CLINICAL DATA:  T9 the lytic mass with possible pulmonary metastases on recent CT abdomen.  EXAM: CT CHEST WITH CONTRAST  TECHNIQUE: Multidetector CT imaging of the chest was performed during intravenous contrast administration.  CONTRAST:  25mL OMNIPAQUE IOHEXOL 300 MG/ML  SOLN  COMPARISON:  CT ABD/PELV WO CM dated 01/28/2013; CT ANGIO CHEST W/CM &/OR WO/CM dated 01/18/2009  FINDINGS: There are pathologically enlarged  mediastinal lymph nodes measuring up to 1.8 cm in the prevascular/ AP window station (image 17). Left hilar lymph node measures 1.2 cm. No axillary adenopathy. Heart size normal. No pericardial effusion.  Small hiatal hernia is again noted. There appears to be an eccentric nodular lesion in the 7-8 o'clock position of the lower esophagus, measuring approximately 1.5 x 1.8 cm with some associated eccentric thickening of the esophageal wall (images 36-39).  A vague area of ground-glass nodularity in the lingula measures 11 x 13 mm (image 24), new from 01/18/2009. There are 2 nodular densities in the left lower lobe, measuring up to 9 x 11 mm (image 38), also new. A tiny nodular density along the right hemidiaphragm (image 40) is new as well. No pleural fluid. Airway is unremarkable.  Incidental imaging of the upper abdomen shows at least 4 heterogeneous low-attenuation lesions in the dome of the liver and left hepatic lobe, measuring up to  approximately 1.6 x 2.4 cm (image 40). Gastrohepatic ligament lymph node measures 10 mm (3 mm on 01/18/2009).  A lytic mass in the right aspect of T9 measures approximately 3.0 x 4.0 cm and encroaches posteriorly on the spinal canal. Mild associated pathologic compression.  IMPRESSION: 1. Suspect a distal esophageal mass and/or eccentric thickening with borderline enlarged gastrohepatic ligament lymph node, pathologically enlarged mediastinal and left hilar lymph nodes, new pulmonary nodules, T9 metastasis and hepatic metastatic disease. 2. 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 Per clinic notes, patient is scheduled for MR thoracic spine 02/02/2013 and is being followed by the multidisciplinary thoracic Oncology Clinic.   Electronically Signed   By: Fred Picket M.D.   On: 01/31/2013 13:08   Mr Thoracic Spine W Wo Contrast  01/31/2013   CLINICAL DATA:  Low back pain. Evaluate for metastatic disease. T9 mass on recent CT.  EXAM: MRI THORACIC SPINE WITHOUT AND  WITH CONTRAST  TECHNIQUE: Multiplanar and multiecho pulse sequences of the thoracic spine were obtained without and with intravenous contrast.  CONTRAST:  55mL MULTIHANCE GADOBENATE DIMEGLUMINE 529 MG/ML IV SOLN  COMPARISON:  Chest CT 01/31/2013  FINDINGS: Vertebral alignment is normal. There is no evidence of compression fracture. There is a destructive soft tissue mass involving the right half of the T9 vertebral body and right pedicle. This mass measures 4.3 x 2.8 by 2.1 cm (AP by transverse by craniocaudal). This mass disrupts the cortex laterally and extends into the paravertebral tissues and also disrupts the cortex posteriorly extending into the spinal canal. There is at least moderate spinal stenosis at this level with impression on the right ventrolateral spinal cord. No abnormal spinal cord signal is present to suggest edema.  There is also abnormal marrow signal and intense enhancement which appears to involve the right transverse process at T5. Mild edema and enhancement within the anterosuperior corner of the T10 vertebral body may be reactive. Small focus of enhancement and T2 signal abnormality in the posterior T10 vertebral body abutting the superior endplate is most suggestive of a Schmorl's node. The thoracic spinal cord is normal in signal. There is no evidence of disc herniation.  IMPRESSION: 1. Destructive right T9 mass with epidural tumor extension and moderate spinal stenosis. No evidence of spinal cord edema. 2. Abnormal marrow signal and enhancement in the right T5 transverse process, also concerning for metastasis.   Electronically Signed   By: Logan Bores   On: 01/31/2013 15:35   Spine Instability Neoplastic Score (SINS): SINS Component Description Score  Location Junctional (Occ-C2, C7-T2, T11-L1, L5-S1) Mobile (C3-6, L2-4) Semirigid (T3-10) Rigid (S2-5) 3 2 1  0  Pain Yes Occasional, non-mechanical No 3 1 0  Bone Lesion Lytic Mixed Blastic 2 1 0  Alignment  Subluxation/Translation De Novo deformity Normal 4 2 0  Vertebral Body >50% collapse <50% collapse No collapse >50% VB involved None of above 3 2 1  0  Posterolateral Involvment Bilateral Unilateral 3 1   Tallied Score from 6 Components: Stable Potentially Unstable Unstable  0-6 7-12 13-18   SINS Score: 11 potentially unstable  Fisher CG, et al. A novel classification system for spinal instability in neoplastic disease: an evidence-based approach and expert consensus from the Spine Oncology Study Group. Spine  62(13):Y8657-8, 2010    IMPRESSION: This patient is a very nice 43 year old gentleman with no significant previous medical history presenting with new onset back pain. 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 His spinal involvement places him at risk for potential spinal cord compression if the tumor progresses further. He also has some potential instability of the spine due to the relatively large lytic component of the T9 lesion.   PLAN:  Today, I talked with the patient about the findings and workup thus far. We talked about our suspicion that he has a malignant process which may be stage IV. In order to proceed with further evaluation and treatment recommendations, a tissue diagnosis needs to be established. Biopsy will be performed tomorrow. In the interest of expediting care to avoid spinal cord injury, we will follow up on the results of the biopsy and develop a multidisciplinary recommendation regarding spine management. Ultimately, the patient may benefit from spine stabilization versus spinal stereotactic radiosurgery versus conventional spine radiotherapy, depending on tumor histology.   Currently, the plan is to proceed with image guided biopsy tomorrow and PET CT next Thursday. Thereafter, the patient may likely undergo spinal cord decompression and separations surgery with spine stabilization on  [place] 12. This will likely be followed by stereotactic radiosurgery depending on tumor histology. A  I spent 40 minutes minutes face to face with the patient and more than 50% of that time was spent in counseling and/or coordination of care.   ------------------------------------------------  Sheral Apley. [name], M.D.

## 2013-02-01 ENCOUNTER — Ambulatory Visit (HOSPITAL_COMMUNITY)
Admission: RE | Admit: 2013-02-01 | Discharge: 2013-02-01 | Disposition: A | Discharge status: Home / Self Care | Payer: BC Managed Care – PPO | Source: Ambulatory Visit | Attending: Internal Medicine | Admitting: Internal Medicine

## 2013-02-01 ENCOUNTER — Other Ambulatory Visit: Payer: Self-pay | Admitting: Internal Medicine

## 2013-02-01 ENCOUNTER — Encounter (HOSPITAL_COMMUNITY): Payer: Self-pay

## 2013-02-01 VITALS — BP 135/92 | HR 74 | Temp 96.9°F | Resp 16

## 2013-02-01 DIAGNOSIS — R918 Other nonspecific abnormal finding of lung field: Secondary | ICD-10-CM | POA: Insufficient documentation

## 2013-02-01 DIAGNOSIS — M949 Disorder of cartilage, unspecified: Secondary | ICD-10-CM

## 2013-02-01 DIAGNOSIS — K769 Liver disease, unspecified: Secondary | ICD-10-CM

## 2013-02-01 DIAGNOSIS — C7951 Secondary malignant neoplasm of bone: Secondary | ICD-10-CM | POA: Insufficient documentation

## 2013-02-01 DIAGNOSIS — M549 Dorsalgia, unspecified: Secondary | ICD-10-CM | POA: Insufficient documentation

## 2013-02-01 DIAGNOSIS — C7952 Secondary malignant neoplasm of bone marrow: Principal | ICD-10-CM

## 2013-02-01 DIAGNOSIS — M899 Disorder of bone, unspecified: Secondary | ICD-10-CM | POA: Insufficient documentation

## 2013-02-01 DIAGNOSIS — K7689 Other specified diseases of liver: Secondary | ICD-10-CM | POA: Insufficient documentation

## 2013-02-01 LAB — CBC
HCT: 44.8 % (ref 39.0–52.0)
Hemoglobin: 15.6 g/dL (ref 13.0–17.0)
MCH: 29.2 pg (ref 26.0–34.0)
MCHC: 34.8 g/dL (ref 30.0–36.0)
MCV: 83.7 fL (ref 78.0–100.0)
PLATELETS: 406 10*3/uL — AB (ref 150–400)
RBC: 5.35 MIL/uL (ref 4.22–5.81)
RDW: 12.4 % (ref 11.5–15.5)
WBC: 20.4 10*3/uL — ABNORMAL HIGH (ref 4.0–10.5)

## 2013-02-01 LAB — APTT: APTT: 28 s (ref 24–37)

## 2013-02-01 LAB — PROTIME-INR
INR: 1.07 (ref 0.00–1.49)
PROTHROMBIN TIME: 13.7 s (ref 11.6–15.2)

## 2013-02-01 MED ORDER — HYDROCODONE-ACETAMINOPHEN 5-325 MG PO TABS
1.0000 | ORAL_TABLET | ORAL | Status: DC | PRN
  Filled 2013-02-01: qty 2

## 2013-02-01 MED ORDER — FENTANYL CITRATE 0.05 MG/ML IJ SOLN
INTRAMUSCULAR | Status: AC
  Filled 2013-02-01: qty 4

## 2013-02-01 MED ORDER — SODIUM CHLORIDE 0.9 % IV SOLN
Freq: Once | INTRAVENOUS | Status: AC
  Administered 2013-02-01: 20 mL/h via INTRAVENOUS

## 2013-02-01 MED ORDER — MIDAZOLAM HCL 2 MG/2ML IJ SOLN
INTRAMUSCULAR | Status: AC | PRN
  Administered 2013-02-01 (×2): 2 mg via INTRAVENOUS

## 2013-02-01 MED ORDER — FENTANYL CITRATE 0.05 MG/ML IJ SOLN
INTRAMUSCULAR | Status: AC | PRN
  Administered 2013-02-01: 100 ug via INTRAVENOUS

## 2013-02-01 MED ORDER — MIDAZOLAM HCL 2 MG/2ML IJ SOLN
INTRAMUSCULAR | Status: AC
  Filled 2013-02-01: qty 4

## 2013-02-01 NOTE — Patient Instructions (Signed)
Follow up visit in one week for evaluation after the biopsy and imaging studies

## 2013-02-01 NOTE — H&P (Signed)
[place] scanning demonstrated an ill defined lesion within the dome of the right lobe of the liver which correlates with the hypoattenuating lesion seen on recent CT.  The lesion is suboptimally visualized secondary to lesion location and poor sonographic window in part d/t pt body habitus.  As such, the decision was made to perform CT guided biopsy of the lytic lesion involving the T9 vertebral body.  D/W pt who is agreement with this POC.

## 2013-02-01 NOTE — Procedures (Signed)
Technically successful CT guided biopsy of lytic lesion involving the right side of the T9 vertebral body.  No immediate post procedural complications.

## 2013-02-01 NOTE — H&P (Signed)
Fred [name] is an 43 y.o. male.   Chief Complaint: "I'm here for a biopsy" HPI: Fred [name] is a 43 y.o. male who presented with back pain starting in December up to 4-5/10 pain. Marland Kitchen He underwent plain film x-rays of the spine. These appeared to show some calcifications projecting over the lower pole the right kidney suggestive of probable right renal stones. Consequently, he underwent abdominal CT on January 26. 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 In addition, patient was noted to have small pulmonary nodules in lower lobes as well as a low-attenuation lesion in the liver worrisome for metastatic disease. There was also some enlargement of the distal esophagus suggesting a small hiatal hernia versus mass. Recent chest CT seems to further suggest a distal esophageal mass with eccentric thickening and borderline enlarged gastrohepatic ligament lymph node. The pulmonary nodules were again noted along with left hilar adenopathy. The T9 metastasis was better characterized to extend posteriorly into the spinal canal. Thoracic spine MRI confirmed a 4.3x2.8x2.1 cm destructive right T9 mass with epidural tumor extension and moderate spinal stenosis. There was no evidence of spinal cord edema. Also noted was abnormal marrow signal and enhancement in the right T5 transverse process concerning for metastasis. He presents today for biopsy of liver lesion vs T9 lesion depending on adequate visibility/accessibility with imaging.  Past Medical History  Diagnosis Date  . 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 No pertinent past surgical history.  History reviewed. No pertinent family history. Social History:  reports that he has never smoked. He has quit using smokeless tobacco. His smokeless tobacco use included Chew. His alcohol and drug histories are not on  file.  Allergies: No Known Allergies  Current outpatient prescriptions:dexamethasone (DECADRON) 4 MG tablet, Take 4 mg by mouth 4 (four) times daily., Disp: , Rfl: ;  moxifloxacin (AVELOX) 400 MG tablet, Take 400 mg by mouth daily at 8 pm., Disp: , Rfl: ;  tiZANidine (ZANAFLEX) 4 MG tablet, Take 4 mg by mouth every 6 (six) hours as needed for muscle spasms., Disp: , Rfl: ;  ibuprofen (ADVIL,MOTRIN) 200 MG tablet, Take 200 mg by mouth every 6 (six) hours as needed., Disp: , Rfl:  naproxen sodium (ANAPROX) 220 MG tablet, Take 220 mg by mouth 2 (two) times daily as needed (pain)., Disp: , Rfl: ;  oxyCODONE-acetaminophen (PERCOCET/ROXICET) 5-325 MG per tablet, Take 1 tablet by mouth every 6 (six) hours as needed for severe pain., Disp: 60 tablet, Rfl: 0  No results found for this or any previous visit (from the past 70 hour(s)). Ct Chest W Contrast  01/31/2013   CLINICAL DATA:  T9 the lytic mass with possible pulmonary metastases on recent CT abdomen.  EXAM: CT CHEST WITH CONTRAST  TECHNIQUE: Multidetector CT imaging of the chest was performed during intravenous contrast administration.  CONTRAST:  22mL OMNIPAQUE IOHEXOL 300 MG/ML  SOLN  COMPARISON:  CT ABD/PELV WO CM dated 01/28/2013; CT ANGIO CHEST W/CM &/OR WO/CM dated 01/18/2009  FINDINGS: There are pathologically enlarged mediastinal lymph nodes measuring up to 1.8 cm in the prevascular/ AP window station (image 17). Left hilar lymph node measures 1.2 cm. No axillary adenopathy. Heart size normal. No pericardial effusion.  Small hiatal hernia is again noted. There appears to be an eccentric nodular lesion in the 7-8 o'clock position of the lower esophagus,  measuring approximately 1.5 x 1.8 cm with some associated eccentric thickening of the esophageal wall (images 36-39).  A vague area of ground-glass nodularity in the lingula measures 11 x 13 mm (image 24), new from 01/18/2009. There are 2 nodular densities in the left lower lobe, measuring up to 9 x 11 mm  (image 38), also new. A tiny nodular density along the right hemidiaphragm (image 40) is new as well. No pleural fluid. Airway is unremarkable.  Incidental imaging of the upper abdomen shows at least 4 heterogeneous low-attenuation lesions in the dome of the liver and left hepatic lobe, measuring up to approximately 1.6 x 2.4 cm (image 40). Gastrohepatic ligament lymph node measures 10 mm (3 mm on 01/18/2009).  A lytic mass in the right aspect of T9 measures approximately 3.0 x 4.0 cm and encroaches posteriorly on the spinal canal. Mild associated pathologic compression.  IMPRESSION: 1. Suspect a distal esophageal mass and/or eccentric thickening with borderline enlarged gastrohepatic ligament lymph node, pathologically enlarged mediastinal and left hilar lymph nodes, new pulmonary nodules, T9 metastasis and hepatic metastatic disease. 2. 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 Per clinic notes, patient is scheduled for MR thoracic spine 02/02/2013 and is being followed by the multidisciplinary thoracic Oncology Clinic.   Electronically Signed   By: Lorin Picket M.D.   On: 01/31/2013 13:08   Mr Thoracic Spine W Wo Contrast  01/31/2013   CLINICAL DATA:  Low back pain. Evaluate for metastatic disease. T9 mass on recent CT.  EXAM: MRI THORACIC SPINE WITHOUT AND WITH CONTRAST  TECHNIQUE: Multiplanar and multiecho pulse sequences of the thoracic spine were obtained without and with intravenous contrast.  CONTRAST:  8mL MULTIHANCE GADOBENATE DIMEGLUMINE 529 MG/ML IV SOLN  COMPARISON:  Chest CT 01/31/2013  FINDINGS: Vertebral alignment is normal. There is no evidence of compression fracture. There is a destructive soft tissue mass involving the right half of the T9 vertebral body and right pedicle. This mass measures 4.3 x 2.8 by 2.1 cm (AP by transverse by craniocaudal). This mass disrupts the cortex laterally and extends into the paravertebral tissues and also disrupts the cortex posteriorly  extending into the spinal canal. There is at least moderate spinal stenosis at this level with impression on the right ventrolateral spinal cord. No abnormal spinal cord signal is present to suggest edema.  There is also abnormal marrow signal and intense enhancement which appears to involve the right transverse process at T5. Mild edema and enhancement within the anterosuperior corner of the T10 vertebral body may be reactive. Small focus of enhancement and T2 signal abnormality in the posterior T10 vertebral body abutting the superior endplate is most suggestive of a Schmorl's node. The thoracic spinal cord is normal in signal. There is no evidence of disc herniation.  IMPRESSION: 1. Destructive right T9 mass with epidural tumor extension and moderate spinal stenosis. No evidence of spinal cord edema. 2. Abnormal marrow signal and enhancement in the right T5 transverse process, also concerning for metastasis.   Electronically Signed   By: Logan Bores   On: 01/31/2013 15:35   Results for orders placed during the hospital encounter of 02/01/13  APTT      Result Value Range   aPTT 28  24 - 37 seconds  CBC      Result Value Range   WBC 20.4 (*) 4.0 - 10.5 K/uL   RBC 5.35  4.22 - 5.81 MIL/uL   Hemoglobin 15.6  13.0 - 17.0 g/dL   HCT  44.8  39.0 - 52.0 %   MCV 83.7  78.0 - 100.0 fL   MCH 29.2  26.0 - 34.0 pg   MCHC 34.8  30.0 - 36.0 g/dL   RDW 12.4  11.5 - 15.5 %   Platelets 406 (*) 150 - 400 K/uL  PROTIME-INR      Result Value Range   Prothrombin Time 13.7  11.6 - 15.2 seconds   INR 1.07  0.00 - 1.49    Review of Systems  Constitutional: Negative for fever and chills.  Respiratory: Negative for cough, hemoptysis and shortness of breath.   Cardiovascular: Negative for chest pain.  Gastrointestinal: Negative for nausea, vomiting and abdominal pain.  Musculoskeletal: Positive for back pain.  Neurological: Negative for headaches.  Endo/Heme/Allergies: Does not bruise/bleed easily.    Blood  pressure 153/105, pulse 99, temperature 97 F (36.1 C), temperature source Oral, resp. rate 20, height 5\' 11"  (1.803 m), weight 282 lb (127.914 kg), SpO2 99.00%. Physical Exam  Constitutional: He is oriented to person, place, and time. He appears well-developed and well-nourished.  Cardiovascular: Normal rate and regular rhythm.   Respiratory: Effort normal and breath sounds normal.  GI: Soft. Bowel sounds are normal. There is no tenderness.  Musculoskeletal: Normal range of motion. He exhibits no edema.  Neurological: He is alert and oriented to person, place, and time.     Assessment/Plan Pt with hx back pain, renal stones, no known hx of malignancy; now with T5/9 lesions, lung/liver lesions, ?distal esophageal mass. Plan is for US/CT guided liver vs T9 lesion biopsy today. Details/risks of procedure d/w pt/wife with their understanding and consent.  Mikias Lanz,D KEVIN 02/01/2013, 9:10 AM

## 2013-02-01 NOTE — Discharge Instructions (Signed)
Conscious Sedation, Adult, Care After Refer to this sheet in the next few weeks. These instructions provide you with information on caring for yourself after your procedure. Your health care provider may also give you more specific instructions. Your treatment has been planned according to current medical practices, but problems sometimes occur. Call your health care provider if you have any problems or questions after your procedure. WHAT TO EXPECT AFTER THE PROCEDURE  After your procedure:  You may feel sleepy, clumsy, and have poor balance for several hours.  Vomiting may occur if you eat too soon after the procedure. HOME CARE INSTRUCTIONS  Do not participate in any activities where you could become injured for at least 24 hours. Do not:  Drive.  Swim.  Ride a bicycle.  Operate heavy machinery.  Cook.  Use power tools.  Climb ladders.  Work from a high place.  Do not make important decisions or sign legal documents until you are improved.  If you vomit, drink water, juice, or soup when you can drink without vomiting. Make sure you have little or no nausea before eating solid foods.  Only take over-the-counter or prescription medicines for pain, discomfort, or fever as directed by your health care provider.  Make sure you and your family fully understand everything about the medicines given to you, including what side effects may occur.  You should not drink alcohol, take sleeping pills, or take medicines that cause drowsiness for at least 24 hours.  If you smoke, do not smoke without supervision.  If you are feeling better, you may resume normal activities 24 hours after you were sedated.  Keep all appointments with your health care provider. SEEK MEDICAL CARE IF:  Your skin is pale or bluish in color.  You continue to feel nauseous or vomit.  Your pain is getting worse and is not helped by medicine.  You have bleeding or swelling.  You are still sleepy or  feeling clumsy after 24 hours. SEEK IMMEDIATE MEDICAL CARE IF:  You develop a rash.  You have difficulty breathing.  You develop any type of allergic problem.  You have a fever. MAKE SURE YOU:  Understand these instructions.  Will watch your condition.  Will get help right away if you are not doing well or get worse. Document Released: 10/10/2012 Document Reviewed: 07/27/2012 [facility] Patient Information 2014 [place], [place]. Needle Biopsy Care After These instructions give you information on caring for yourself after your procedure. Your doctor may also give you more specific instructions. Call your doctor if you have any problems or questions after your procedure. HOME CARE  Rest for 4 hours after your biopsy, except for getting up to go to the bathroom or as told.  Keep the places where the needles were put in clean and dry.  Do not put powder or lotion on the sites.  Do not shower until 24 hours after the test. Remove all bandages (dressings) before showering.  Remove all bandages at least once every day. Gently clean the sites with soap and water. Keep putting a new bandage on until the skin is closed. Finding out the results of your test Ask your doctor when your test results will be ready. Make sure you follow up and get the test results. GET HELP RIGHT AWAY IF:   You have shortness of breath or trouble breathing.  You have pain or cramping in your belly (abdomen).  You feel sick to your stomach (nauseous) or throw up (vomit).  Any of  the places where the needles were put in:  Are puffy (swollen) or red.  Are sore or hot to the touch.  Are draining yellowish-white fluid (pus).  Are bleeding after 10 minutes of pressing down on the site. Have someone keep pressing on any place that is bleeding until you see a doctor.  You have any unusual pain that will not stop.  You have a fever. If you go to the emergency room, tell the nurse that you had a biopsy.  Take this paper with you to show the nurse. MAKE SURE YOU:   Understand these instructions.  Will watch your condition.  Will get help right away if you are not doing well or get worse. Document Released: 12/03/2007 Document Revised: 03/14/2011 Document Reviewed: 12/03/2007 [facility] Patient Information 2014 [place].

## 2013-02-02 ENCOUNTER — Other Ambulatory Visit: Payer: PRIVATE HEALTH INSURANCE

## 2013-02-04 ENCOUNTER — Ambulatory Visit (HOSPITAL_COMMUNITY): Payer: PRIVATE HEALTH INSURANCE

## 2013-02-04 ENCOUNTER — Ambulatory Visit: Payer: BC Managed Care – PPO

## 2013-02-06 ENCOUNTER — Encounter: Payer: Self-pay | Admitting: Internal Medicine

## 2013-02-06 ENCOUNTER — Ambulatory Visit (HOSPITAL_COMMUNITY): Payer: PRIVATE HEALTH INSURANCE

## 2013-02-06 ENCOUNTER — Other Ambulatory Visit: Payer: Self-pay | Admitting: Neurosurgery

## 2013-02-06 NOTE — Progress Notes (Signed)
Patient had left a message for call back. I called and he wanted to know how financial asst worked with [place]. I advised once plan of care is determined and he has bill from [facility]. He will call me if any other questions.

## 2013-02-07 ENCOUNTER — Ambulatory Visit (HOSPITAL_BASED_OUTPATIENT_CLINIC_OR_DEPARTMENT_OTHER): Payer: BC Managed Care – PPO | Admitting: Internal Medicine

## 2013-02-07 ENCOUNTER — Encounter: Payer: Self-pay | Admitting: Radiation Oncology

## 2013-02-07 ENCOUNTER — Encounter (HOSPITAL_COMMUNITY)
Admission: RE | Admit: 2013-02-07 | Discharge: 2013-02-07 | Disposition: A | Discharge status: Home / Self Care | Payer: BC Managed Care – PPO | Source: Ambulatory Visit | Attending: Family Medicine | Admitting: Family Medicine

## 2013-02-07 ENCOUNTER — Telehealth: Payer: Self-pay | Admitting: Internal Medicine

## 2013-02-07 ENCOUNTER — Encounter: Payer: Self-pay | Admitting: *Deleted

## 2013-02-07 ENCOUNTER — Encounter: Payer: Self-pay | Admitting: Internal Medicine

## 2013-02-07 ENCOUNTER — Other Ambulatory Visit (HOSPITAL_BASED_OUTPATIENT_CLINIC_OR_DEPARTMENT_OTHER): Payer: BC Managed Care – PPO

## 2013-02-07 VITALS — BP 154/101 | HR 110 | Temp 98.0°F | Resp 18 | Ht 71.0 in | Wt 282.3 lb

## 2013-02-07 DIAGNOSIS — C7952 Secondary malignant neoplasm of bone marrow: Secondary | ICD-10-CM

## 2013-02-07 DIAGNOSIS — C801 Malignant (primary) neoplasm, unspecified: Secondary | ICD-10-CM

## 2013-02-07 DIAGNOSIS — C159 Malignant neoplasm of esophagus, unspecified: Secondary | ICD-10-CM | POA: Insufficient documentation

## 2013-02-07 DIAGNOSIS — C799 Secondary malignant neoplasm of unspecified site: Secondary | ICD-10-CM

## 2013-02-07 DIAGNOSIS — R911 Solitary pulmonary nodule: Secondary | ICD-10-CM

## 2013-02-07 DIAGNOSIS — C787 Secondary malignant neoplasm of liver and intrahepatic bile duct: Secondary | ICD-10-CM

## 2013-02-07 DIAGNOSIS — C7951 Secondary malignant neoplasm of bone: Secondary | ICD-10-CM

## 2013-02-07 LAB — COMPREHENSIVE METABOLIC PANEL (CC13)
ALT: 40 U/L (ref 0–55)
ANION GAP: 11 meq/L (ref 3–11)
AST: 17 U/L (ref 5–34)
Albumin: 4.1 g/dL (ref 3.5–5.0)
Alkaline Phosphatase: 138 U/L (ref 40–150)
BILIRUBIN TOTAL: 0.83 mg/dL (ref 0.20–1.20)
BUN: 16 mg/dL (ref 7.0–26.0)
CHLORIDE: 98 meq/L (ref 98–109)
CO2: 28 mEq/L (ref 22–29)
CREATININE: 0.8 mg/dL (ref 0.7–1.3)
Calcium: 9.7 mg/dL (ref 8.4–10.4)
Glucose: 100 mg/dl (ref 70–140)
Potassium: 4.6 mEq/L (ref 3.5–5.1)
Sodium: 137 mEq/L (ref 136–145)
Total Protein: 7.5 g/dL (ref 6.4–8.3)

## 2013-02-07 LAB — CBC WITH DIFFERENTIAL/PLATELET
BASO%: 0.1 % (ref 0.0–2.0)
BASOS ABS: 0 10*3/uL (ref 0.0–0.1)
EOS%: 0 % (ref 0.0–7.0)
Eosinophils Absolute: 0 10*3/uL (ref 0.0–0.5)
HCT: 49 % (ref 38.4–49.9)
HEMOGLOBIN: 16.6 g/dL (ref 13.0–17.1)
LYMPH#: 1.2 10*3/uL (ref 0.9–3.3)
LYMPH%: 4.8 % — AB (ref 14.0–49.0)
MCH: 28.9 pg (ref 27.2–33.4)
MCHC: 33.9 g/dL (ref 32.0–36.0)
MCV: 85.4 fL (ref 79.3–98.0)
MONO#: 1.3 10*3/uL — ABNORMAL HIGH (ref 0.1–0.9)
MONO%: 5 % (ref 0.0–14.0)
NEUT#: 23.4 10*3/uL — ABNORMAL HIGH (ref 1.5–6.5)
NEUT%: 90.1 % — AB (ref 39.0–75.0)
PLATELETS: 428 10*3/uL — AB (ref 140–400)
RBC: 5.73 10*6/uL (ref 4.20–5.82)
RDW: 13 % (ref 11.0–14.6)
WBC: 25.9 10*3/uL — ABNORMAL HIGH (ref 4.0–10.3)

## 2013-02-07 LAB — TECHNOLOGIST REVIEW: Technologist Review: 1

## 2013-02-07 LAB — GLUCOSE, CAPILLARY: GLUCOSE-CAPILLARY: 100 mg/dL — AB (ref 70–99)

## 2013-02-07 MED ORDER — FLUDEOXYGLUCOSE F - 18 (FDG) INJECTION
13.4000 | Freq: Once | INTRAVENOUS | Status: AC | PRN
  Administered 2013-02-07: 13.4 via INTRAVENOUS

## 2013-02-07 NOTE — Progress Notes (Signed)
[facility] Work  Clinical Social Work met with Futures trader and patient/spouse during [place] to follow up from last week's [place].  MD reviewed patient's diagnosis, prognosis, and treatment plan.  MD answered all of patient/spouse's questions and discussed need for further workup.  CSW did not provide counseling at this time, but CSW will follow up with patient and family to provide support.   Polo Riley, MSW, LCSW, OSW-C Clinical Social Worker [facility] [phone]

## 2013-02-07 NOTE — Telephone Encounter (Signed)
Gave pt appt for lab and MD for 2 weeks, will call pt for rest of appts

## 2013-02-08 ENCOUNTER — Telehealth: Payer: Self-pay | Admitting: Internal Medicine

## 2013-02-08 ENCOUNTER — Telehealth: Payer: Self-pay | Admitting: Medical Oncology

## 2013-02-08 ENCOUNTER — Ambulatory Visit: Payer: BC Managed Care – PPO

## 2013-02-08 ENCOUNTER — Ambulatory Visit
Admission: RE | Admit: 2013-02-08 | Discharge: 2013-02-08 | Disposition: A | Discharge status: Home / Self Care | Payer: BC Managed Care – PPO | Source: Ambulatory Visit | Attending: Internal Medicine | Admitting: Internal Medicine

## 2013-02-08 ENCOUNTER — Ambulatory Visit
Admission: RE | Admit: 2013-02-08 | Payer: BC Managed Care – PPO | Source: Ambulatory Visit | Admitting: Radiation Oncology

## 2013-02-08 ENCOUNTER — Encounter (HOSPITAL_COMMUNITY): Payer: Self-pay | Admitting: Pharmacy Technician

## 2013-02-08 DIAGNOSIS — C159 Malignant neoplasm of esophagus, unspecified: Secondary | ICD-10-CM

## 2013-02-08 LAB — CEA: CEA: 4.2 ng/mL (ref 0.0–5.0)

## 2013-02-08 MED ORDER — GADOBENATE DIMEGLUMINE 529 MG/ML IV SOLN
20.0000 mL | Freq: Once | INTRAVENOUS | Status: AC | PRN
  Administered 2013-02-08: 20 mL via INTRAVENOUS

## 2013-02-08 NOTE — Patient Instructions (Signed)
Followup visit in 2 weeks. 

## 2013-02-08 NOTE — Telephone Encounter (Signed)
Talked to pt gave hom appt for MRI today pt cannot make it today and he will call MRI @ [facility] to r/s appt

## 2013-02-08 NOTE — Telephone Encounter (Signed)
Pt appt. To see Dr. Reynaldo Minium @ [facility] is 02/27/13@3 :00. medical records faxed,slides will be fedex'ed. Pt aware to hand carry cd to appt.

## 2013-02-08 NOTE — Telephone Encounter (Signed)
called pt appt for endoscopy for Dr. Michail Sermon with [facility] for 02/11/13, 0945am

## 2013-02-08 NOTE — Pre-Procedure Instructions (Addendum)
ALIF PETRAK  02/08/2013   Your procedure is scheduled on:  Thursday, February 12.  Report to [facility], Main Entrance [facility]" at 6:30 AM.  Call this number if you have problems the morning of surgery: [phone]   Remember:   Do not eat food or drink liquids after midnight WEDNESDAY.   Take these medicines the morning of surgery with A SIP OF WATER: dexamethasone (DECADRON).  Take if needed: oxyCODONE-acetaminophen (PERCOCET/ROXICET)    Do not wear jewelry, make-up or nail polish.  Do not wear lotions, powders, or perfumes. You may wear deodorant.             Men may shave face and neck.  Do not bring valuables to the hospital.  [facility] is not responsible for any belongings or valuables.               Contacts, dentures or bridgework may not be worn into surgery.  Leave suitcase in the car. After surgery it may be brought to your room.  For patients admitted to the hospital, discharge time is determined by your treatment team.             Special Instructions: Shower with CHG wash (Bactoshield) tonight and again in the am prior to arriving to hospital.   Please read over the following fact sheets that you were given: Pain Booklet, Coughing and Deep Breathing, Blood Transfusion Information and Surgical Site Infection Prevention

## 2013-02-08 NOTE — Progress Notes (Signed)
[place] [phone]   [phone]  OFFICE PROGRESS NOTE  Linward Foster, MD [place], 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 male returns to the clinic today for followup visit accompanied by his wife. The patient underwent several studies recently for evaluation of the metastatic disease discovered on recent scan while evaluating back pain. He underwent CT-guided biopsy of the T9 lytic lesion by interventional radiology and the final pathology was consistent with adenocarcinoma of upper GI source. The patient also underwent a PET scan which was performed earlier today and he is here for evaluation and discussion of his scan and biopsy results. He still feeling fine except for the back pain and he is currently on pain medication in the form of Percocet 5/325 mg by mouth every 6 hours as needed. He also takes ibuprofen and currently on Decadron 4 mg by mouth every 6 hours. He denied having any significant chest pain, shortness of breath, cough or hemoptysis. He denied having any dysphagia or odynophagia. He has no significant weight loss or night sweats.  MEDICAL HISTORY: Past Medical History  Diagnosis Date  . 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  MEDICATIONS:  Current Outpatient Prescriptions  Medication Sig Dispense Refill  . dexamethasone (DECADRON) 4 MG tablet Take 4 mg by mouth 4 (four) times daily.      Marland Kitchen oxyCODONE-acetaminophen (PERCOCET/ROXICET) 5-325 MG per tablet Take 1 tablet by mouth every 6 (six) hours as needed for severe pain.  60 tablet  0  . ibuprofen (ADVIL,MOTRIN) 200 MG tablet Take 200 mg by mouth every 6 (six) hours as needed.      . moxifloxacin (AVELOX) 400 MG tablet  Take 400 mg by mouth daily at 8 pm.      . naproxen sodium (ANAPROX) 220 MG tablet Take 220 mg by mouth 2 (two) times daily as needed (pain).      Marland Kitchen tiZANidine (ZANAFLEX) 4 MG tablet Take 4 mg by mouth every 6 (six) hours as needed for muscle spasms.       No current facility-administered medications for this visit.    REVIEW OF SYSTEMS:  Constitutional: negative Eyes: negative Ears, nose, mouth, throat, and face: negative Respiratory: negative Cardiovascular: negative Gastrointestinal: negative Genitourinary:negative Integument/breast: negative Hematologic/lymphatic: negative Musculoskeletal:positive for back pain Neurological: negative Behavioral/Psych: negative Endocrine: negative Allergic/Immunologic: negative   PHYSICAL EXAMINATION: General appearance: alert, cooperative, fatigued and no distress Head: Normocephalic, without obvious abnormality, atraumatic Neck: no JVD, supple, symmetrical, trachea midline, thyroid not enlarged, symmetric, no tenderness/mass/nodules and Palpable small left supraclavicular lymph node Lymph nodes: Palpable small left supraclavicular lymph node Resp: clear to auscultation bilaterally Back: symmetric, no curvature. ROM normal. No CVA tenderness. Cardio: regular rate and rhythm, S1, S2 normal, no murmur, click, rub or gallop GI: soft, non-tender; bowel sounds normal; no masses,  no organomegaly Extremities: extremities normal, atraumatic, no cyanosis or edema Neurologic: Alert and oriented X 3, normal strength and tone. Normal symmetric reflexes. Normal coordination and gait  ECOG PERFORMANCE STATUS: 1 - Symptomatic but completely ambulatory  Blood pressure 154/101, pulse 110, temperature 98 F (36.7 C), temperature source Oral, resp. rate 18, height $RemoveBe'5\' 11"'dTCsRNWhP$  (1.803 m), weight 282 lb 4.8 oz (128.05 kg), SpO2 96.00%.  LABORATORY  DATA: Lab Results  Component Value Date   WBC 25.9* 02/07/2013   HGB 16.6 02/07/2013   HCT 49.0 02/07/2013   MCV 85.4  02/07/2013   PLT 428* 02/07/2013      Chemistry      Component Value Date/Time   NA 137 02/07/2013 1209   K 4.6 02/07/2013 1209   CO2 28 02/07/2013 1209   BUN 16.0 02/07/2013 1209   CREATININE 0.8 02/07/2013 1209      Component Value Date/Time   CALCIUM 9.7 02/07/2013 1209   ALKPHOS 138 02/07/2013 1209   AST 17 02/07/2013 1209   ALT 40 02/07/2013 1209   BILITOT 0.83 02/07/2013 1209       RADIOGRAPHIC STUDIES: Ct Abdomen Pelvis Wo Contrast  01/28/2013   CLINICAL DATA:  Right-sided back pain. Renal stones questioned on recent lumbar spine series.  EXAM: CT ABDOMEN AND PELVIS WITHOUT CONTRAST  TECHNIQUE: Multidetector CT imaging of the abdomen and pelvis was performed following the standard protocol without intravenous contrast.  COMPARISON:  DG LUMBAR SPINE COMPLETE dated 01/21/2013; CT ANGIO CHEST W/CM &/OR WO/CM dated 01/18/2009  FINDINGS: Lung bases show a 5 mm subpleural nodule in the medial right lower lobe (image 8) and a 10 mm nodule in the left lower lobe (image 5), new from 01/18/2009. Heart size normal. No pericardial or pleural effusion. Small hiatal hernia with possible associated thickening. Difficult to exclude a mass (example image 9).  There is an ovoid area of low attenuation in the dome of the liver, measuring 1.8 x 3.8 cm (image 14), not well seen on 01/18/2009. Liver, gallbladder and adrenal glands are otherwise unremarkable. Small stones are seen in the lower pole right kidney. Prominent extrarenal pelvis and mild caliectasis on the right. There is abrupt transition to normal caliber right ureter at the ureteropelvic junction (coronal image 75), where there appears to be a crossing vessel (coronal image 73). Kidneys, spleen, pancreas, stomach and bowel, including appendix are unremarkable.  Gastrohepatic ligament lymph nodes measure up to 11 mm (image 18). Additional scattered lymph nodes in the abdomen and pelvis are sub cm in short axis size. Prostate is normal in size. Small bilateral inguinal  hernias contain fat. No free fluid.  There is a lytic lesion in the lateral aspect of the T9 vertebral body, measuring approximately 2.9 x 3.9 cm. Slight compression of the lateral aspect of the T9 vertebral body in association (coronal image 97 and sagittal image 86). No additional worrisome lytic or sclerotic lesions.  IMPRESSION: 1. Lytic mass in the T9 vertebral body with mild associated pathologic compression. Findings correlate with the patient's history of right sided back pain and are most consistent with metastatic disease. 2. Pulmonary nodules in the lower lobes and a low-attenuation lesion in the liver are new from 01/18/2009, also worrisome for metastatic disease. 3. Small hiatal hernia with wall thickening. Difficult to exclude a mass. Borderline enlarged gastrohepatic ligament lymph node. 4. These results were called by telephone at the time of interpretation on 01/28/2013 at 2:54 PM to Dr. Jonathon Jordan , who verbally acknowledged these results. 5. Right renal stones with right pelvocaliectasis. Abrupt transition to normal caliber proximal right ureter may be due to a crossing vessel. No perinephric stranding to indicate acute obstruction. 6. Small bilateral inguinal hernias contain fat.   Electronically Signed   By: Lorin Picket M.D.   On: 01/28/2013 14:54   Dg Thoracic Spine W/swimmers  01/21/2013   CLINICAL DATA:  Back pain.  EXAM: THORACIC SPINE -  2 VIEW + SWIMMERS  COMPARISON:  None.  FINDINGS: Alignment is anatomic. Vertebral body height is maintained. Mild scattered endplate degenerative changes. Cervicothoracic junction is in alignment.  IMPRESSION: Mild degenerative change.  No acute findings.   Electronically Signed   By: Leanna Battles M.D.   On: 01/21/2013 14:55   Dg Lumbar Spine Complete  01/21/2013   CLINICAL DATA:  Pain.  EXAM: LUMBAR SPINE - COMPLETE 4+ VIEW  COMPARISON:  None.  FINDINGS: Alignment is anatomic. Vertebral body height is maintained. Mild scattered endplate  degenerative changes. Slight loss of disc space height at L2-3. Mild facet sclerosis in the lower lumbar spine. No definite pars defects. Calcifications project over the lower pole right kidney.  IMPRESSION: 1. Mild scattered spondylosis. 2. Probable right renal stones.   Electronically Signed   By: Leanna Battles M.D.   On: 01/21/2013 14:56   Ct Chest W Contrast  01/31/2013   CLINICAL DATA:  T9 the lytic mass with possible pulmonary metastases on recent CT abdomen.  EXAM: CT CHEST WITH CONTRAST  TECHNIQUE: Multidetector CT imaging of the chest was performed during intravenous contrast administration.  CONTRAST:  51mL OMNIPAQUE IOHEXOL 300 MG/ML  SOLN  COMPARISON:  CT ABD/PELV WO CM dated 01/28/2013; CT ANGIO CHEST W/CM &/OR WO/CM dated 01/18/2009  FINDINGS: There are pathologically enlarged mediastinal lymph nodes measuring up to 1.8 cm in the prevascular/ AP window station (image 17). Left hilar lymph node measures 1.2 cm. No axillary adenopathy. Heart size normal. No pericardial effusion.  Small hiatal hernia is again noted. There appears to be an eccentric nodular lesion in the 7-8 o'clock position of the lower esophagus, measuring approximately 1.5 x 1.8 cm with some associated eccentric thickening of the esophageal wall (images 36-39).  A vague area of ground-glass nodularity in the lingula measures 11 x 13 mm (image 24), new from 01/18/2009. There are 2 nodular densities in the left lower lobe, measuring up to 9 x 11 mm (image 38), also new. A tiny nodular density along the right hemidiaphragm (image 40) is new as well. No pleural fluid. Airway is unremarkable.  Incidental imaging of the upper abdomen shows at least 4 heterogeneous low-attenuation lesions in the dome of the liver and left hepatic lobe, measuring up to approximately 1.6 x 2.4 cm (image 40). Gastrohepatic ligament lymph node measures 10 mm (3 mm on 01/18/2009).  A lytic mass in the right aspect of T9 measures approximately 3.0 x 4.0 cm and  encroaches posteriorly on the spinal canal. Mild associated pathologic compression.  IMPRESSION: 1. Suspect a distal esophageal mass and/or eccentric thickening with borderline enlarged gastrohepatic ligament lymph node, pathologically enlarged mediastinal and left hilar lymph nodes, new pulmonary nodules, T9 metastasis and hepatic metastatic disease. 2. 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 Per clinic notes, patient is scheduled for MR thoracic spine 02/02/2013 and is being followed by the multidisciplinary thoracic Oncology Clinic.   Electronically Signed   By: Leanna Battles M.D.   On: 01/31/2013 13:08   Mr Thoracic Spine W Wo Contrast  01/31/2013   CLINICAL DATA:  Low back pain. Evaluate for metastatic disease. T9 mass on recent CT.  EXAM: MRI THORACIC SPINE WITHOUT AND WITH CONTRAST  TECHNIQUE: Multiplanar and multiecho pulse sequences of the thoracic spine were obtained without and with intravenous contrast.  CONTRAST:  43mL MULTIHANCE GADOBENATE DIMEGLUMINE 529 MG/ML IV SOLN  COMPARISON:  Chest CT 01/31/2013  FINDINGS: Vertebral alignment is normal. There is no evidence of compression fracture.  There is a destructive soft tissue mass involving the right half of the T9 vertebral body and right pedicle. This mass measures 4.3 x 2.8 by 2.1 cm (AP by transverse by craniocaudal). This mass disrupts the cortex laterally and extends into the paravertebral tissues and also disrupts the cortex posteriorly extending into the spinal canal. There is at least moderate spinal stenosis at this level with impression on the right ventrolateral spinal cord. No abnormal spinal cord signal is present to suggest edema.  There is also abnormal marrow signal and intense enhancement which appears to involve the right transverse process at T5. Mild edema and enhancement within the anterosuperior corner of the T10 vertebral body may be reactive. Small focus of enhancement and T2 signal abnormality in the  posterior T10 vertebral body abutting the superior endplate is most suggestive of a Schmorl's node. The thoracic spinal cord is normal in signal. There is no evidence of disc herniation.  IMPRESSION: 1. Destructive right T9 mass with epidural tumor extension and moderate spinal stenosis. No evidence of spinal cord edema. 2. Abnormal marrow signal and enhancement in the right T5 transverse process, also concerning for metastasis.   Electronically Signed   By: Logan Bores   On: 01/31/2013 15:35   Nm Pet Image Initial (pi) Skull Base To Thigh  02/07/2013   CLINICAL DATA:  Initial treatment strategy for metastatic adenocarcinoma involving the bones, lungs, and liver.  EXAM: NUCLEAR MEDICINE PET SKULL BASE TO THIGH  FASTING BLOOD GLUCOSE:  Value: $Remov'100mg'VkyMRG$ /dl  TECHNIQUE: 13.4 mCi F-18 FDG was injected intravenously. CT data was obtained and used for attenuation correction and anatomic localization only. (This was not acquired as a diagnostic CT examination.) Additional exam technical data entered on technologist worksheet.  COMPARISON:  MR T SPINE WO/W CM dated 01/31/2013; CT CHEST W/CM dated 01/31/2013; CT ABD/PELV WO CM dated 01/28/2013  FINDINGS: NECK  Scattered metastatic nodes noted in the left posterior cervical triangle, left internal jugular, left supraclavicular chains along with subcutaneous nodules in the left supraclavicular region of and a small metastatic focus in the posterior deltoid muscle on the left. Index conglomerate left supraclavicular adenopathy maximum standard uptake value 26.7 within index lymph node in this vicinity measuring 1.7 cm in short axis on image 46 of series 4.  CHEST  Prevascular, AP window, subcarinal, and upper paratracheal hypermetabolic adenopathy observed with the prevascular lymph node measuring 1.9 cm in short axis on image 66 at the CT data and having a maximum standard uptake value of 36.0. Subtly hypermetabolic 5 mm nodule along the left hemidiaphragm is difficult to measure  due to breathing motion artifact. Hypermetabolic 5 mm ground-glass opacity in the left upper lobe on image 72 of the CT data, with maximum standard uptake value 2.3.  Extensive distal esophageal wall thickening with hypermetabolic activity, maximum standard uptake value 26.2.  ABDOMEN/PELVIS  Abnormal multifocal hypermetabolic activity in the gastric cardia compatible with foci of tumor. A gastrohepatic ligament lymph node measuring 1.1 cm in short axis on image 101 of the CT data has a maximum standard uptake value of 9.3.  Small but highly hypermetabolic lymph nodes above the celiac trunk noted. Hypermetabolic focus in the pancreas uncinate process region has a maximum standard uptake value of 8.6.  About 6 metastatic foci are present in the liver. The confluence of two adjacent metastatic lesions in segment 4A has a maximum standard uptake value a.m. of 20.6 an measures proximally 5.5 x 3.1 cm.  Right caliectasis with prominent extrarenal pelvis  and abrupt transition at the UPJ suggesting low-grade chronic UPJ obstruction. There are two 5 mm calculi in the right kidney lower pole, both nonobstructive.  Mild sigmoid diverticulosis.  SKELETON  Osseous metastatic lesions in the left proximal humerus, right inferior glenoid, right first rib, right fifth thoracic transverse process, the T9 vertebral body, the left iliac crest, and the right proximal femoral shaft are noted. There is paraspinal and epidural epidural tumor on the right at the T9 level. Because this was recently biopsied which could throw off measurements, I will instead take the curvilinear left iliac lesion for measurement. This has a maximum standard uptake value of 17.3.  A small focus of hyper metabolism in the right gluteus medius muscle could represent a small metastatic lesion. Similar focus in the right paraspinal musculature at the L2 level.  IMPRESSION: 1. Widespread metastatic disease in the neck, chest, abdomen, and skeleton. Unusual areas  of involvement include several muscles in addition to the pancreas. I suspect delayed that the primary lesion might be the distal esophageal mass. I do recommend imaging the brain to assess for intracranial metastatic disease. 2. As is known from the recent thoracic spine MRI, the T9 metastatic lesion is particularly severe, with paraspinal and epidural extension. 3. 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 Right nonobstructive nephrolithiasis.   Electronically Signed   By: Sherryl Barters M.D.   On: 02/07/2013 15:41   US Abdomen Limited  02/01/2013   CLINICAL DATA:  Evaluate previously identified hypoattenuating lesion within the liver for potential ultrasound-guided biopsy.  EXAM: LIMITED ABDOMINAL ULTRASOUND  COMPARISON:  CT ABD/PELV WO CM dated 01/28/2013; MR T SPINE WO/W CM dated 01/31/2013  FINDINGS: There is an ill-defined largely hypoechoic mass within the dome of the right lobe of the liver which measures approximately 3.4 x 2.2 cm (image 5) and correlates with the hypo attenuating lesion seen on preprocedural CT.  This lesion is noted to be at a depth of at least 10 cm from the skin surface and is overall suboptimally visualized secondary to patient body habitus, lesion location and poor sonographic window.  IMPRESSION: There is an ill-defined approximately 3.4 cm mass within the dome of the anterior segment of the right lobe of the liver which correlates with the hypo attenuating lesion seen on preceding abdominal CT. Given the depth of the mass, the ill-defined borders of the mass as well as the poor sonographic window in part contributed to patient body habitus, the decision was made to perform CT-guided biopsy of the known lytic destructive mass involving the right lateral aspect of the T9 vertebral body.   Electronically Signed   By: Sandi Mariscal M.D.   On: 02/01/2013 12:28   Ct Biopsy  02/01/2013   INDICATION: History of back pain with recent imaging demonstrating a lytic lesion  involving the right lateral aspect of the T9 vertebral body as well as an ill-defined hypo attenuating liver lesion. As the liver lesion was suboptimally visualized on ultrasound secondary to lesion depth and poor sonographic window due to patient body habitus, the lytic lesion involving the right lateral aspect of the T9 vertebral body was selected for biopsy.  EXAM: CT BIOPSY OF LYTIC LESION INVOLVING THE T9 VERTEBRAL BODY  MEDICATIONS: Fentanyl 100 mcg IV; Versed 4 mg IV  ANESTHESIA/SEDATION: Sedation time  25 minutes  CONTRAST:  None  COMPARISON:  US BIOPSY dated 02/01/2013; CT CHEST W/CM dated 01/31/2013; MR T SPINE WO/W CM dated 01/31/2013; CT ABD/PELV WO CM dated 01/28/2013  PROCEDURE: Informed consent was obtained from the patient following an explanation of the procedure, risks, benefits and alternatives. A time out was performed prior to the initiation of the procedure.  The patient was positioned prone on the CT table and a limited CT was performed for procedural planning demonstrating the ill-defined approximately 3.9 x 2.9 cm hypoattenuating lesion involving the right side of the T9 vertebral body. The procedure was planned. The operative site was prepped and draped in the usual sterile fashion. Appropriate trajectory was confirmed with a 22 gauge spinal needle after the adjacent tissues were anesthetized with 1% Lidocaine with epinephrine.  Under intermittent CT guidance, and utilizing a caudal to cranial approach, a 17 gauge coaxial needle was advanced into the peripheral aspect of the lytic mass within the T9 vertebral body. Positioning was confirmed and 3 core needle biopsies were obtained with an 18 gauge core needle biopsy device. Visual inspection of the samples demonstrates adequate tissue acquisition. The co-axial needle was removed and hemostasis was achieved with manual compression.  A limited postprocedural CT was negative for hemorrhage or additional complication. A dressing was placed. The  patient tolerated the procedure well without immediate postprocedural complication.  COMPLICATIONS: None immediate  IMPRESSION: Technically successful CT guided core needle biopsy of lytic lesion involving the right lateral aspect of the T9 vertebral body.   Electronically Signed   By: Sandi Mariscal M.D.   On: 02/01/2013 16:28    ASSESSMENT AND PLAN: This is a very pleasant and unfortunate 43 years old white male who is recently diagnosed with widely metastatic adenocarcinoma of questionable upper gastrointestinal primary questionable for distal esophageal adenocarcinoma. I have a lengthy discussion that was very emotional with the patient and his wife today about his current condition. I showed them the images of the PET scan. I recommended for the patient referred to gastroenterology for consideration of upper endoscopy to confirm the diagnosis of esophageal adenocarcinoma. I discussed with the patient several options for his treatment consideration of palliative systemic chemotherapy. There are several options for treatment of his condition including epirubicin, cisplatin and 5-FU/Xeloda per epirubicin, oxaliplatin and Xeloda. It was very hard to discuss palliative care and hospice with the patient at this time but I would approach it in the future. I will also send his tissue block [facility] one for Biomarkers testing. I would also to the pathology Department to test his tissue for HER-2. The patient will see Dr. [name] tomorrow for consideration of palliative radiotherapy to the T9 lesion. He is also seeing Dr. Vertell Limber for consideration of surgical intervention and stabilization of that lesion. I also gave the patient the option of second opinion at [facility] to see if there is any better options for management of his condition or any new clinical trials. I will also complete the staging workup by ordering an MRI of the brain  The patient voices understanding of current disease  status and treatment options and is in agreement with the current care plan.  All questions were answered. The patient knows to call the clinic with any problems, questions or concerns. We can certainly see the patient much sooner if necessary.  I spent 20 minutes counseling the patient face to face. The total time spent in the appointment was 35 minutes.  Disclaimer: This note was dictated with voice recognition software. Similar sounding words can inadvertently be transcribed and may not be corrected upon review.

## 2013-02-08 NOTE — Telephone Encounter (Signed)
Requests to move up  endoscopy that is scheduled for the 19th and have it before  his neurosurgery on the 12th . I told her I would ask you, but I thought that neurosurgery took priority.

## 2013-02-11 ENCOUNTER — Encounter (HOSPITAL_COMMUNITY): Payer: Self-pay

## 2013-02-11 ENCOUNTER — Encounter (HOSPITAL_COMMUNITY)
Admission: RE | Admit: 2013-02-11 | Discharge: 2013-02-11 | Disposition: A | Discharge status: Home / Self Care | Payer: BC Managed Care – PPO | Source: Ambulatory Visit | Attending: Anesthesiology | Admitting: Anesthesiology

## 2013-02-11 ENCOUNTER — Other Ambulatory Visit (HOSPITAL_COMMUNITY)
Admission: RE | Admit: 2013-02-11 | Discharge: 2013-02-11 | Disposition: A | Discharge status: Home / Self Care | Payer: BC Managed Care – PPO | Source: Ambulatory Visit | Attending: Internal Medicine | Admitting: Internal Medicine

## 2013-02-11 ENCOUNTER — Telehealth: Payer: Self-pay | Admitting: *Deleted

## 2013-02-11 ENCOUNTER — Other Ambulatory Visit: Payer: BC Managed Care – PPO

## 2013-02-11 ENCOUNTER — Telehealth: Payer: Self-pay | Admitting: Internal Medicine

## 2013-02-11 ENCOUNTER — Encounter (HOSPITAL_COMMUNITY)
Admission: RE | Admit: 2013-02-11 | Discharge: 2013-02-11 | Disposition: A | Discharge status: Home / Self Care | Payer: BC Managed Care – PPO | Source: Ambulatory Visit | Attending: Neurosurgery | Admitting: Neurosurgery

## 2013-02-11 DIAGNOSIS — C7952 Secondary malignant neoplasm of bone marrow: Principal | ICD-10-CM

## 2013-02-11 DIAGNOSIS — C7951 Secondary malignant neoplasm of bone: Secondary | ICD-10-CM | POA: Insufficient documentation

## 2013-02-11 DIAGNOSIS — C801 Malignant (primary) neoplasm, unspecified: Secondary | ICD-10-CM | POA: Insufficient documentation

## 2013-02-11 HISTORY — DX: Gastro-esophageal reflux disease without esophagitis: K21.9

## 2013-02-11 LAB — CBC
HCT: 46.2 % (ref 39.0–52.0)
HEMOGLOBIN: 16.8 g/dL (ref 13.0–17.0)
MCH: 30.2 pg (ref 26.0–34.0)
MCHC: 36.4 g/dL — ABNORMAL HIGH (ref 30.0–36.0)
MCV: 83.1 fL (ref 78.0–100.0)
Platelets: 313 10*3/uL (ref 150–400)
RBC: 5.56 MIL/uL (ref 4.22–5.81)
RDW: 12.7 % (ref 11.5–15.5)
WBC: 28.4 10*3/uL — ABNORMAL HIGH (ref 4.0–10.5)

## 2013-02-11 LAB — COMPREHENSIVE METABOLIC PANEL
ALT: 35 U/L (ref 0–53)
AST: 19 U/L (ref 0–37)
Albumin: 3.7 g/dL (ref 3.5–5.2)
Alkaline Phosphatase: 118 U/L — ABNORMAL HIGH (ref 39–117)
BILIRUBIN TOTAL: 1 mg/dL (ref 0.3–1.2)
BUN: 16 mg/dL (ref 6–23)
CO2: 28 mEq/L (ref 19–32)
Calcium: 9.2 mg/dL (ref 8.4–10.5)
Chloride: 95 mEq/L — ABNORMAL LOW (ref 96–112)
Creatinine, Ser: 0.82 mg/dL (ref 0.50–1.35)
GFR calc Af Amer: >90 mL/min (ref >90)
GFR calc non Af Amer: >90 mL/min (ref >90)
GLUCOSE: 93 mg/dL (ref 70–99)
Potassium: 4.4 mEq/L (ref 3.7–5.3)
SODIUM: 138 meq/L (ref 137–147)
TOTAL PROTEIN: 7.4 g/dL (ref 6.0–8.3)

## 2013-02-11 LAB — ABO/RH: ABO/RH(D): O POS

## 2013-02-11 LAB — SURGICAL PCR SCREEN
MRSA, PCR: NEGATIVE
Staphylococcus aureus: NEGATIVE

## 2013-02-11 NOTE — Telephone Encounter (Signed)
Message copied by Britt Bottom on Mon Feb 11, 2013  4:25 PM ------      Message from: Ardeen Garland      Created: Fri Feb 08, 2013  4:39 PM       FYI-      Requests to move up  endoscopy that is scheduled for the 19th and have it before  his neurosurgery on the 12th . I told her I would ask you, but I thought that neurosurgery took priority.      Do you want the endoscopy before neurosurgery? ------

## 2013-02-11 NOTE — Telephone Encounter (Signed)
Pt appt. To see Dr. Reynaldo Minium @ [facility] is 02/27/13@3 :00. Medical records faxed, slides will be fedex'ed.  Pt aware to hand carry cd to appt.

## 2013-02-11 NOTE — Telephone Encounter (Signed)
Per Dr Vista Mink, it does not matter which order they are done, but they both need to be done and recommends he keep the appts as scheduled.  Called and left vm on home # with information.  SLJ

## 2013-02-12 NOTE — Progress Notes (Signed)
Anesthesia Chart Review:  Patient is a 43 year old male scheduled for T9 vertebrectomy, T8-10 fusion on 02/14/13 by Dr. Vertell Limber.  History includes recent evaluation for back pain revealed a lytic mass in the T9 vertebral body with mild associated pathologic compression most consistent with metastatic disease. 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  Other history includes non-smoker (former tobacco chewer), GERD, obesity. PCP is Dr. Jonathon Jordan. Oncologist is Dr. Julien Nordmann.  He is scheduled for endoscopy with Dr Michail Sermon in the near future.  EKG on 02/11/13 showed NSR.  CXR today showed no active disease.    Preoperative labs noted.  WBC 28.4.  He was afebrile at PAT and, CXR was unremarkable. He is on Decadron which is likely contributing.  (His WBC has been elevated at 20.4 and 25.9 since 02/01/13 with 02/07/13 results marked as reviewed by Dr. Julien Nordmann). I left a voicemail regarding results with Manuela Schwartz at Dr. Melven Sartorius office.  Will defer additional orders, if any, to Dr. Vertell Limber.  [name] [facility] Short Stay Center/Anesthesiology Phone [phone] 02/12/2013 10:15 AM

## 2013-02-13 ENCOUNTER — Encounter: Payer: Self-pay | Admitting: *Deleted

## 2013-02-13 MED ORDER — DEXTROSE 5 % IV SOLN
3.0000 g | INTRAVENOUS | Status: AC
  Administered 2013-02-14 (×2): 3 g via INTRAVENOUS
  Filled 2013-02-13: qty 3000

## 2013-02-13 NOTE — H&P (Signed)
> [address] [place], [place] [address] Phone: [phone]   Patient ID:   [id number] Patient: Fred [name]  Date of Birth: [birth year] Visit Type: Consult   Date: 01/31/2013 07:15 AM Provider: Marchia Meiers. Vertell Limber MD   This 43 year old male presents for Thoracic spine fracture.  HISTORY OF PRESENT ILLNESS: 1.  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   HISTORY OF PRESENT ILLNESS::Fred [name] is a 43 y.o. male who presented with back pain starting in December up to 4-5/10 pain.  Marland Kitchen He underwent plain film x-rays of the spine. These appeared to show some calcifications projecting over the lower pole the right kidney suggestive of probable right renal stones. Consequently, he underwent abdominal CT on January 26. 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 In addition, patient was noted to have small pulmonary nodules and lower lobes as well as a low-attenuation lesion in the liver worrisome for metastatic disease.  There was also some enlargement of the distal esophagus suggesting a small hiatal hernia versus mass. The patient's primary care physician referred the patient to our multidisciplinary thoracic clinic. Prior to presentation, we obtained CT of the chest and thoracic MRI earlier today.  The chest CT seems to further suggest a distal esophageal mass with eccentric thickening and borderline enlarged gastrohepatic ligament lymph node. The pulmonary nodules were again noted along with  left hilar adenopathy. The T9 metastasis was better characterize to extend posteriorly into the spinal canal. Thoracic spine MRI confirmed a 4.3x2.8x2.1 cm destructive right T9 mass with epidural tumor extension and moderate spinal stenosis. There was no evidence of spinal cord edema. Also noted was abnormal marrow signal and enhancement in the right T5 transverse process concerning for metastasis. At this time, the patient is scheduled for biopsy with interventional radiology tomorrow. His films were reviewed in our multidisciplinary conference. Today, he is being seen by Dr. [name] from [name], Dr. Earlie Server from medical oncology and myself from neurosurgery in the clinical evaluation in the multidisciplinary thoracic oncology clinic.   PREVIOUS RADIATION THERAPY: No   PAST MEDICAL HISTORY: No previous medical problems      PAST SURGICAL HISTORY: No previous surgeries   FAMILY HISTORY: family history is not on file.   SOCIAL HISTORY:  reports that he has never smoked. He does not have any smokeless tobacco history on file. the patient has used oral tobacco for several years.   ALLERGIES: Review of patient's allergies indicates no known allergies.   MEDICATIONS:  Current Outpatient Prescriptions   Medication  Sig  Dispense  Refill   .  dexamethasone (DECADRON) 4 MG tablet  Take 4 mg by mouth 4 (four) times daily.         Marland Kitchen  ibuprofen (ADVIL,MOTRIN) 200 MG tablet  Take 200 mg by mouth every 6 (six) hours as needed.         Marland Kitchen  naproxen sodium (ANAPROX) 220 MG tablet  Take 220 mg by mouth 2 (two) times daily as needed (pain).         Marland Kitchen  oxyCODONE-acetaminophen (PERCOCET/ROXICET) 5-325 MG per tablet  Take 1 tablet by mouth every 6 (six) hours as needed for severe pain.   60 tablet   0   .  tiZANidine (ZANAFLEX) 4 MG tablet  Take 4 mg by mouth every 6 (six) hours as needed for muscle spasms.             No current facility-administered medications for this encounter.         REVIEW OF SYSTEMS:  A 15 point review of systems is documented in the electronic medical record. This was obtained by the nursing staff. However, I reviewed this with the patient to discuss relevant findings and make appropriate changes.  Pertinent items are noted in HPI.      KPS = 90   100 - Normal; no complaints; no evidence of disease. 90   - Able to carry on normal activity; minor signs or symptoms of disease. 80   - Normal activity with effort; some signs or symptoms of disease. 17   - Cares for self; unable to carry on normal activity or to do active work. 60   - Requires occasional assistance, but is able to care for most of his personal needs. 50   - Requires considerable assistance and frequent medical care. 35   - Disabled; requires special care and assistance. 35   - Severely disabled; hospital admission is indicated although death not imminent. 60   - Very sick; hospital admission necessary; active supportive treatment necessary. 10   - Moribund; fatal processes progressing rapidly. 0     - Dead   Karnofsky DA, Abelmann Greenlee, Craver LS and Burchenal JH [phone]) The use of the nitrogen mustards in the palliative treatment of carcinoma: with particular reference to bronchogenic carcinoma Cancer 1 634-56   LABORATORY DATA:   No results found for this basename: WBC,  HGB,  HCT,  MCV,  PLT       No results found for this basename: NA,  K,  CL,  CO2       No results found for this basename: ALT,  AST,  GGT,  ALKPHOS,  BILITOT        PULMONARY FUNCTION TEST:  Not applicable    PAST MEDICAL HISTORY, SURGICAL HISTORY, FAMILY HISTORY, SOCIAL HISTORY AND REVIEW OF SYSTEMS I have reviewed the patient's past medical, surgical, family and social history as well as the comprehensive review of systems as included on the [facility] history form dated, which I have signed.    MEDICATIONS(added, continued or stopped this  visit):   ALLERGIES:     PHYSICAL EXAM General Level of Distress: no acute distress Overall Appearance: normal    Cardiovascular Cardiac: regular rate and rhythm without murmur  Respiratory Lungs: clear to auscultation  Neurological Recent and Remote Memory: normal Attention Span and Concentration:   normal Language: normal Fund of Knowledge: normal  Right Left Sensation: normal normal Upper Extremity Coordination: normal normal  Lower Extremity Coordination: normal normal  Musculoskeletal Gait and Station: normal  Right Left Upper Extremity Muscle Strength: normal normal Lower Extremity Muscle Strength: normal normal Upper Extremity Muscle Tone:  normal normal Lower Extremity Muscle Tone: normal normal  Motor Strength Upper and lower extremity motor strength was tested in the clinically pertinent muscles.   Deep Tendon Reflexes  Right Left Biceps: normal normal Triceps: normal normal Brachiloradialis: normal normal Patellar: normal normal Achilles: normal normal  Sensory Sensation was tested at L1 to S1.  Cranial Nerves II. Optic Nerve/Visual Fields: normal III. Oculomotor: normal IV. Trochlear: normal V. Trigeminal: normal VI. Abducens: normal VII. Facial: normal VIII. Acoustic/Vestibular: normal IX. Glossopharyngeal: normal X. Vagus: normal XI. Spinal Accessory: normal XII. Hypoglossal: normal  Motor and other Tests Lhermittes: negative Rhomberg: negative    Right Left Hoffman's: normal normal Clonus: normal normal Babinski: normal normal SLR: negative negative Patrick's Corky Sox): negative negative Toe Walk: normal normal Toe Lift: normal normal Heel Walk: normal normal SI Joint: nontender nontender   Additional Findings:  Patient denies significant back or flank pain at the present time although he does have discomfort in the region of T9 on the right at this point.  This is not terribly severe to percussion and he has minimal  pain to palpation.    DIAGNOSTIC RESULTS RADIOGRAPHY: Ct Abdomen Pelvis Wo Contrast  01/28/2013 CLINICAL DATA: Right-sided back pain. Renal stones questioned on recent lumbar spine series. EXAM: CT ABDOMEN AND PELVIS WITHOUT CONTRAST TECHNIQUE: Multidetector CT imaging of the abdomen and pelvis was performed following the standard protocol without intravenous contrast. COMPARISON: DG LUMBAR SPINE COMPLETE dated 01/21/2013; CT ANGIO CHEST W/CM &/OR WO/CM dated 01/18/2009 FINDINGS: Lung bases show a 5 mm subpleural nodule in the medial right lower lobe (image 8) and a 10 mm nodule in the left lower lobe (image 5), new from 01/18/2009. Heart size normal. No pericardial or pleural effusion. Small hiatal hernia with possible associated thickening. Difficult to exclude a mass (example image 9). There is an ovoid area of low attenuation in the dome of the liver, measuring 1.8 x 3.8 cm (image 14), not well seen on 01/18/2009. Liver, gallbladder and adrenal glands are otherwise unremarkable. Small stones are seen in the lower pole right kidney. Prominent extrarenal pelvis and mild caliectasis on the right. There is abrupt transition to normal caliber right ureter at the ureteropelvic junction (coronal image 75), where there appears to be a crossing vessel (coronal image 73). Kidneys, spleen, pancreas, stomach and bowel, including appendix are unremarkable. Gastrohepatic ligament lymph nodes measure up to 11 mm (image 18). Additional scattered lymph nodes in the abdomen and pelvis are sub cm in short axis size. Prostate is normal in size. Small bilateral inguinal hernias contain fat. No free fluid. There is a lytic lesion in the lateral aspect of the T9 vertebral body, measuring approximately 2.9 x 3.9 cm. Slight compression of the lateral aspect of the T9 vertebral body in association (coronal image 97 and sagittal image 86). No additional worrisome lytic or sclerotic lesions. IMPRESSION: 1. Lytic mass in the T9 vertebral  body with mild associated pathologic compression. Findings correlate with the patient's history of right sided back pain and are most consistent with metastatic disease. 2. Pulmonary nodules in the lower lobes and a low-attenuation lesion in the liver are new from 01/18/2009, also worrisome for metastatic disease. 3. Small hiatal hernia with wall thickening. Difficult to exclude a mass. Borderline enlarged gastrohepatic ligament lymph node. 4. These results were called by telephone at the time of interpretation on 01/28/2013 at 2:54 PM to Dr. Jonathon Jordan , who verbally acknowledged these results. 5. Right renal stones with right pelvocaliectasis. Abrupt transition to normal caliber proximal right ureter may be due to a crossing vessel. No perinephric stranding to indicate acute obstruction. 6. Small bilateral inguinal hernias contain fat. Electronically Signed By: Lorin Picket  M.D. On: 01/28/2013 14:54  Dg Thoracic Spine W/swimmers  01/21/2013 CLINICAL DATA: Back pain. EXAM: THORACIC SPINE - 2 VIEW + SWIMMERS COMPARISON: None. FINDINGS: Alignment is anatomic. Vertebral body height is maintained. Mild scattered endplate degenerative changes. Cervicothoracic junction is in alignment. IMPRESSION: Mild degenerative change. No acute findings. Electronically Signed By: Lorin Picket M.D. On: 01/21/2013 14:55  Dg Lumbar Spine Complete  01/21/2013 CLINICAL DATA: Pain. EXAM: LUMBAR SPINE - COMPLETE 4+ VIEW COMPARISON: None. FINDINGS: Alignment is anatomic. Vertebral body height is maintained. Mild scattered endplate degenerative changes. Slight loss of disc space height at L2-3. Mild facet sclerosis in the lower lumbar spine. No definite pars defects. Calcifications project over the lower pole right kidney. IMPRESSION: 1. Mild scattered spondylosis. 2. Probable right renal stones. Electronically Signed By: Lorin Picket M.D. On: 01/21/2013 14:56  Ct Chest W Contrast  01/31/2013 CLINICAL DATA: T9 the lytic mass with  possible pulmonary metastases on recent CT abdomen. EXAM: CT CHEST WITH CONTRAST TECHNIQUE: Multidetector CT imaging of the chest was performed during intravenous contrast administration. CONTRAST: 87mL OMNIPAQUE IOHEXOL 300 MG/ML SOLN COMPARISON: CT ABD/PELV WO CM dated 01/28/2013; CT ANGIO CHEST W/CM &/OR WO/CM dated 01/18/2009 FINDINGS: There are pathologically enlarged mediastinal lymph nodes measuring up to 1.8 cm in the prevascular/ AP window station (image 17). Left hilar lymph node measures 1.2 cm. No axillary adenopathy. Heart size normal. No pericardial effusion. Small hiatal hernia is again noted. There appears to be an eccentric nodular lesion in the 7-8 o'clock position of the lower esophagus, measuring approximately 1.5 x 1.8 cm with some associated eccentric thickening of the esophageal wall (images 36-39). A vague area of ground-glass nodularity in the lingula measures 11 x 13 mm (image 24), new from 01/18/2009. There are 2 nodular densities in the left lower lobe, measuring up to 9 x 11 mm (image 38), also new. A tiny nodular density along the right hemidiaphragm (image 40) is new as well. No pleural fluid. Airway is unremarkable. Incidental imaging of the upper abdomen shows at least 4 heterogeneous low-attenuation lesions in the dome of the liver and left hepatic lobe, measuring up to approximately 1.6 x 2.4 cm (image 40). Gastrohepatic ligament lymph node measures 10 mm (3 mm on 01/18/2009). A lytic mass in the right aspect of T9 measures approximately 3.0 x 4.0 cm and encroaches posteriorly on the spinal canal. Mild associated pathologic compression. IMPRESSION: 1. Suspect a distal esophageal mass and/or eccentric thickening with borderline enlarged gastrohepatic ligament lymph node, pathologically enlarged mediastinal and left hilar lymph nodes, new pulmonary nodules, T9 metastasis and hepatic metastatic disease. 2. 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 Per clinic notes,  patient is scheduled for MR thoracic spine 02/02/2013 and is being followed by the multidisciplinary thoracic Oncology Clinic. Electronically Signed By: Lorin Picket M.D. On: 01/31/2013 13:08  Mr Thoracic Spine W Wo Contrast  01/31/2013 CLINICAL DATA: Low back pain. Evaluate for metastatic disease. T9 mass on recent CT. EXAM: MRI THORACIC SPINE WITHOUT AND WITH CONTRAST TECHNIQUE: Multiplanar and multiecho pulse sequences of the thoracic spine were obtained without and with intravenous contrast. CONTRAST: 66mL MULTIHANCE GADOBENATE DIMEGLUMINE 529 MG/ML IV SOLN COMPARISON: Chest CT 01/31/2013 FINDINGS: Vertebral alignment is normal. There is no evidence of compression fracture. There is a destructive soft tissue mass involving the right half of the T9 vertebral body and right pedicle. This mass measures 4.3 x 2.8 by 2.1 cm (AP by transverse by craniocaudal). This mass disrupts the cortex laterally and extends into the paravertebral  tissues and also disrupts the cortex posteriorly extending into the spinal canal. There is at least moderate spinal stenosis at this level with impression on the right ventrolateral spinal cord. No abnormal spinal cord signal is present to suggest edema. There is also abnormal marrow signal and intense enhancement which appears to involve the right transverse process at T5. Mild edema and enhancement within the anterosuperior corner of the T10 vertebral body may be reactive. Small focus of enhancement and T2 signal abnormality in the posterior T10 vertebral body abutting the superior endplate is most suggestive of a Schmorl's node. The thoracic spinal cord is normal in signal. There is no evidence of disc herniation. IMPRESSION: 1. Destructive right T9 mass with epidural tumor extension and moderate spinal stenosis. No evidence of spinal cord edema. 2. Abnormal marrow signal and enhancement in the right T5 transverse process, also concerning for metastasis. Electronically Signed By:  Logan Bores On: 01/31/2013 15:35       IMPRESSION Spine Instability Neoplastic Score (SINS):  SINS Component  Description  Score   Location  Junctional (Occ-C2, C7-T2, T11-L1, L5-S1)  Mobile (C3-6, L2-4)  Semirigid (T3-10)  Rigid (S2-5)  3  2  1   0   Pain  Yes  Occasional, non-mechanical  No  3  1  0   Bone Lesion  Lytic  Mixed  Blastic  2  1  0   Alignment  Subluxation/Translation  De Novo deformity  Normal  4  2  0   Vertebral Body  >50% collapse  <50% collapse  No collapse >50% VB involved  None of above  3  2  1   0   Posterolateral Involvment  Bilateral  Unilateral  3  1   Tallied Score from 6 Components:  Stable  Potentially Unstable  Unstable   0-6  7-12  13-18   SINS Score: 11 potentially unstable  Fisher CG, et al. A novel classification system for spinal instability in neoplastic disease: an evidence-based approach and expert consensus from the Spine Oncology Study Group. Spine 88(41):Y6063-0, 2010  IMPRESSION: This patient is a very nice 43 year old gentleman with no significant previous medical history presenting with new onset back pain. 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 His spinal involvement places him at risk for potential spinal cord compression if the tumor progresses further. He also has some potential instability of the spine due to the relatively large lytic component of the T9 lesion.     Assessment/Plan # Detail Type Description   1. 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       2. Assessment Lumbago (724.2).       3. Assessment Thoracic back pain (724.1).       4. Assessment Spinal cord compression (336.9).        PLAN: Today, we talked with the patient about the findings and workup thus far. We talked about our suspicion that he has a malignant process which may be stage IV. In order to proceed with further evaluation and treatment recommendations, a tissue diagnosis  needs to be established. Biopsy will be performed tomorrow. In the interest of expediting care to avoid spinal cord injury, we will follow up on the results of the biopsy and develop a multidisciplinary recommendation regarding spine management. Ultimately, the patient may benefit from spine stabilization versus spinal stereotactic radiosurgery versus conventional spine radiotherapy, depending on tumor histology.  Currently, the plan is to proceed  with image guided biopsy tomorrow and PET CT next Thursday. Thereafter, the patient may likely undergo spinal cord decompression and separations surgery with spine stabilization on February 12. This will likely be followed by stereotactic radiosurgery depending on tumor histology.  We spent 40 minutes minutes face to face with the patient and more than 50% of that time was spent in counseling and/or coordination of care.              Provider:  Marchia Meiers. Vertell Limber MD  02/02/2013 05:11 PM Dictation edited by: Marchia Meiers. Vertell Limber    CC Providers: [facility] [place], [place] [id number]- ----------------------------------------------------------------------------------------------------------------------------------------------------------------------         Electronically signed by Marchia Meiers. Vertell Limber MD on 02/02/2013 05:12 PM

## 2013-02-13 NOTE — Progress Notes (Signed)
[facility] Work  Clinical Social Work contacted patient's spouse by phone to "check in" and provide emotional support.  Mrs. Weatherbee states they are "taking it day by day" and relying heavily on support system.  She shared that her daughter is coping well and is currently being seen by a counselor at her school.  CSW will continue to follow patient and family throughout cancer journey.  Polo Riley, MSW, LCSW, OSW-C Clinical Social Worker [facility] [phone]

## 2013-02-14 ENCOUNTER — Encounter (HOSPITAL_COMMUNITY): Payer: BC Managed Care – PPO | Admitting: Anesthesiology

## 2013-02-14 ENCOUNTER — Inpatient Hospital Stay (HOSPITAL_COMMUNITY): Payer: BC Managed Care – PPO

## 2013-02-14 ENCOUNTER — Encounter (HOSPITAL_COMMUNITY): Payer: Self-pay | Admitting: Anesthesiology

## 2013-02-14 ENCOUNTER — Encounter (HOSPITAL_COMMUNITY)
Admission: RE | Disposition: A | Discharge status: Home / Self Care | Payer: Self-pay | Source: Ambulatory Visit | Attending: Neurosurgery

## 2013-02-14 ENCOUNTER — Inpatient Hospital Stay (HOSPITAL_COMMUNITY)
Admission: RE | Admit: 2013-02-14 | Discharge: 2013-02-17 | DRG: 457 | Disposition: A | Discharge status: Home / Self Care | Payer: BC Managed Care – PPO | Source: Ambulatory Visit | Attending: Neurosurgery | Admitting: Neurosurgery

## 2013-02-14 ENCOUNTER — Inpatient Hospital Stay (HOSPITAL_COMMUNITY): Payer: BC Managed Care – PPO | Admitting: Anesthesiology

## 2013-02-14 DIAGNOSIS — Z01810 Encounter for preprocedural cardiovascular examination: Secondary | ICD-10-CM

## 2013-02-14 DIAGNOSIS — C7951 Secondary malignant neoplasm of bone: Principal | ICD-10-CM | POA: Diagnosis present

## 2013-02-14 DIAGNOSIS — C787 Secondary malignant neoplasm of liver and intrahepatic bile duct: Secondary | ICD-10-CM | POA: Diagnosis present

## 2013-02-14 DIAGNOSIS — Z01812 Encounter for preprocedural laboratory examination: Secondary | ICD-10-CM

## 2013-02-14 DIAGNOSIS — C155 Malignant neoplasm of lower third of esophagus: Secondary | ICD-10-CM | POA: Diagnosis present

## 2013-02-14 DIAGNOSIS — Z01818 Encounter for other preprocedural examination: Secondary | ICD-10-CM

## 2013-02-14 DIAGNOSIS — C7800 Secondary malignant neoplasm of unspecified lung: Secondary | ICD-10-CM | POA: Diagnosis present

## 2013-02-14 DIAGNOSIS — C7952 Secondary malignant neoplasm of bone marrow: Principal | ICD-10-CM

## 2013-02-14 HISTORY — DX: Secondary malignant neoplasm of bone: C79.51

## 2013-02-14 LAB — POCT I-STAT 4, (NA,K, GLUC, HGB,HCT)
Glucose, Bld: 108 mg/dL — ABNORMAL HIGH (ref 70–99)
HCT: 34 % — ABNORMAL LOW (ref 39.0–52.0)
HEMOGLOBIN: 11.6 g/dL — AB (ref 13.0–17.0)
Potassium: 3.9 mEq/L (ref 3.7–5.3)
SODIUM: 133 meq/L — AB (ref 137–147)

## 2013-02-14 LAB — PREPARE RBC (CROSSMATCH)

## 2013-02-14 SURGERY — POSTERIOR LUMBAR FUSION 2 LEVEL
Anesthesia: General | Site: Back

## 2013-02-14 MED ORDER — OXYCODONE-ACETAMINOPHEN 5-325 MG PO TABS
1.0000 | ORAL_TABLET | ORAL | Status: DC | PRN
  Administered 2013-02-15 (×4): 2 via ORAL
  Administered 2013-02-16: 1 via ORAL
  Administered 2013-02-16 – 2013-02-17 (×5): 2 via ORAL
  Filled 2013-02-14 (×5): qty 2
  Filled 2013-02-14: qty 1
  Filled 2013-02-14 (×4): qty 2

## 2013-02-14 MED ORDER — PHENOL 1.4 % MT LIQD: 1.0000 | OROMUCOSAL | Status: DC | PRN

## 2013-02-14 MED ORDER — SODIUM CHLORIDE 0.9 % IJ SOLN
3.0000 mL | Freq: Two times a day (BID) | INTRAMUSCULAR | Status: DC
  Administered 2013-02-14 – 2013-02-16 (×5): 3 mL via INTRAVENOUS

## 2013-02-14 MED ORDER — POLYETHYLENE GLYCOL 3350 17 G PO PACK
17.0000 g | PACK | Freq: Every day | ORAL | Status: DC | PRN
  Filled 2013-02-14: qty 1

## 2013-02-14 MED ORDER — ACETAMINOPHEN 650 MG RE SUPP: 650.0000 mg | RECTAL | Status: DC | PRN

## 2013-02-14 MED ORDER — ROCURONIUM BROMIDE 100 MG/10ML IV SOLN
INTRAVENOUS | Status: DC | PRN
  Administered 2013-02-14: 50 mg via INTRAVENOUS
  Administered 2013-02-14: 30 mg via INTRAVENOUS
  Administered 2013-02-14: 50 mg via INTRAVENOUS

## 2013-02-14 MED ORDER — ARTIFICIAL TEARS OP OINT
TOPICAL_OINTMENT | OPHTHALMIC | Status: DC | PRN
  Administered 2013-02-14: 1 via OPHTHALMIC

## 2013-02-14 MED ORDER — MEPERIDINE HCL 25 MG/ML IJ SOLN: 6.2500 mg | INTRAMUSCULAR | Status: DC | PRN

## 2013-02-14 MED ORDER — FENTANYL CITRATE 0.05 MG/ML IJ SOLN
INTRAMUSCULAR | Status: AC
  Filled 2013-02-14: qty 5

## 2013-02-14 MED ORDER — GLYCOPYRROLATE 0.2 MG/ML IJ SOLN
INTRAMUSCULAR | Status: DC | PRN
  Administered 2013-02-14: 0.6 mg via INTRAVENOUS

## 2013-02-14 MED ORDER — LACTATED RINGERS IV SOLN
INTRAVENOUS | Status: DC | PRN
  Administered 2013-02-14 (×5): via INTRAVENOUS

## 2013-02-14 MED ORDER — MORPHINE SULFATE 2 MG/ML IJ SOLN
1.0000 mg | INTRAMUSCULAR | Status: DC | PRN
  Administered 2013-02-14: 4 mg via INTRAVENOUS
  Administered 2013-02-14: 2 mg via INTRAVENOUS
  Administered 2013-02-14 – 2013-02-15 (×2): 4 mg via INTRAVENOUS
  Filled 2013-02-14 (×3): qty 1
  Filled 2013-02-14: qty 2

## 2013-02-14 MED ORDER — FENTANYL CITRATE 0.05 MG/ML IJ SOLN
INTRAMUSCULAR | Status: DC | PRN
  Administered 2013-02-14 (×4): 100 ug via INTRAVENOUS
  Administered 2013-02-14 (×2): 50 ug via INTRAVENOUS
  Administered 2013-02-14: 100 ug via INTRAVENOUS
  Administered 2013-02-14: 150 ug via INTRAVENOUS

## 2013-02-14 MED ORDER — LACTATED RINGERS IV SOLN
INTRAVENOUS | Status: DC
  Administered 2013-02-14: 10:00:00 via INTRAVENOUS

## 2013-02-14 MED ORDER — DIAZEPAM 5 MG PO TABS
5.0000 mg | ORAL_TABLET | Freq: Four times a day (QID) | ORAL | Status: DC | PRN
  Administered 2013-02-14 – 2013-02-17 (×8): 5 mg via ORAL
  Filled 2013-02-14 (×8): qty 1

## 2013-02-14 MED ORDER — KCL IN DEXTROSE-NACL 20-5-0.45 MEQ/L-%-% IV SOLN
INTRAVENOUS | Status: DC
  Administered 2013-02-14: 20:00:00 via INTRAVENOUS
  Filled 2013-02-14 (×3): qty 1000

## 2013-02-14 MED ORDER — LIDOCAINE HCL (CARDIAC) 20 MG/ML IV SOLN
INTRAVENOUS | Status: AC
Start: 2013-02-14 — End: 2013-02-14
  Filled 2013-02-14: qty 5

## 2013-02-14 MED ORDER — MIDAZOLAM HCL 2 MG/2ML IJ SOLN
INTRAMUSCULAR | Status: AC
  Filled 2013-02-14: qty 2

## 2013-02-14 MED ORDER — ACETAMINOPHEN 325 MG PO TABS: 650.0000 mg | ORAL_TABLET | ORAL | Status: DC | PRN

## 2013-02-14 MED ORDER — ROCURONIUM BROMIDE 50 MG/5ML IV SOLN
INTRAVENOUS | Status: AC
Start: 2013-02-14 — End: 2013-02-14
  Filled 2013-02-14: qty 1

## 2013-02-14 MED ORDER — BUPIVACAINE HCL (PF) 0.5 % IJ SOLN
INTRAMUSCULAR | Status: DC | PRN
  Administered 2013-02-14: 5 mL

## 2013-02-14 MED ORDER — DEXAMETHASONE 4 MG PO TABS: 4.0000 mg | ORAL_TABLET | Freq: Three times a day (TID) | ORAL | Status: DC

## 2013-02-14 MED ORDER — HYDROMORPHONE HCL PF 1 MG/ML IJ SOLN
INTRAMUSCULAR | Status: AC
  Filled 2013-02-14: qty 1

## 2013-02-14 MED ORDER — CEFAZOLIN SODIUM-DEXTROSE 2-3 GM-% IV SOLR
INTRAVENOUS | Status: AC
  Filled 2013-02-14: qty 50

## 2013-02-14 MED ORDER — PROPOFOL 10 MG/ML IV BOLUS
INTRAVENOUS | Status: DC | PRN
  Administered 2013-02-14: 120 mg via INTRAVENOUS

## 2013-02-14 MED ORDER — SENNA 8.6 MG PO TABS
1.0000 | ORAL_TABLET | Freq: Two times a day (BID) | ORAL | Status: DC
  Administered 2013-02-14 – 2013-02-17 (×6): 8.6 mg via ORAL
  Filled 2013-02-14 (×9): qty 1

## 2013-02-14 MED ORDER — FLEET ENEMA 7-19 GM/118ML RE ENEM: 1.0000 | ENEMA | Freq: Once | RECTAL | Status: AC | PRN

## 2013-02-14 MED ORDER — HYDROCODONE-ACETAMINOPHEN 5-325 MG PO TABS: 1.0000 | ORAL_TABLET | ORAL | Status: DC | PRN

## 2013-02-14 MED ORDER — ROCURONIUM BROMIDE 50 MG/5ML IV SOLN
INTRAVENOUS | Status: AC
  Filled 2013-02-14: qty 1

## 2013-02-14 MED ORDER — NEOSTIGMINE METHYLSULFATE 1 MG/ML IJ SOLN
INTRAMUSCULAR | Status: DC | PRN
  Administered 2013-02-14: 4 mg via INTRAVENOUS

## 2013-02-14 MED ORDER — OXYCODONE-ACETAMINOPHEN 5-325 MG PO TABS
1.0000 | ORAL_TABLET | Freq: Four times a day (QID) | ORAL | Status: DC | PRN
  Filled 2013-02-14: qty 1

## 2013-02-14 MED ORDER — ONDANSETRON HCL 4 MG/2ML IJ SOLN
INTRAMUSCULAR | Status: AC
  Filled 2013-02-14: qty 2

## 2013-02-14 MED ORDER — OXYCODONE HCL 5 MG/5ML PO SOLN: 5.0000 mg | Freq: Once | ORAL | Status: DC | PRN

## 2013-02-14 MED ORDER — DEXAMETHASONE 4 MG PO TABS
4.0000 mg | ORAL_TABLET | Freq: Four times a day (QID) | ORAL | Status: DC
  Administered 2013-02-14 – 2013-02-17 (×12): 4 mg via ORAL
  Filled 2013-02-14 (×16): qty 1

## 2013-02-14 MED ORDER — ARTIFICIAL TEARS OP OINT
TOPICAL_OINTMENT | OPHTHALMIC | Status: AC
  Filled 2013-02-14: qty 3.5

## 2013-02-14 MED ORDER — DEXAMETHASONE SODIUM PHOSPHATE 4 MG/ML IJ SOLN
4.0000 mg | Freq: Four times a day (QID) | INTRAMUSCULAR | Status: DC
  Filled 2013-02-14 (×12): qty 1

## 2013-02-14 MED ORDER — HYDROMORPHONE HCL PF 1 MG/ML IJ SOLN
INTRAMUSCULAR | Status: AC
  Administered 2013-02-14 (×4): 0.5 mg
  Filled 2013-02-14: qty 1

## 2013-02-14 MED ORDER — HYDROMORPHONE HCL PF 1 MG/ML IJ SOLN: 0.2500 mg | INTRAMUSCULAR | Status: DC | PRN

## 2013-02-14 MED ORDER — MORPHINE SULFATE 4 MG/ML IJ SOLN
INTRAMUSCULAR | Status: AC
  Filled 2013-02-14: qty 1

## 2013-02-14 MED ORDER — ONDANSETRON HCL 4 MG/2ML IJ SOLN: 4.0000 mg | INTRAMUSCULAR | Status: DC | PRN

## 2013-02-14 MED ORDER — LIDOCAINE HCL (CARDIAC) 20 MG/ML IV SOLN
INTRAVENOUS | Status: DC | PRN
  Administered 2013-02-14: 100 mg via INTRAVENOUS

## 2013-02-14 MED ORDER — DEXAMETHASONE SODIUM PHOSPHATE 10 MG/ML IJ SOLN
INTRAMUSCULAR | Status: DC | PRN
  Administered 2013-02-14: 10 mg via INTRAVENOUS

## 2013-02-14 MED ORDER — LIDOCAINE-EPINEPHRINE 1 %-1:100000 IJ SOLN
INTRAMUSCULAR | Status: DC | PRN
  Administered 2013-02-14: 5 mL

## 2013-02-14 MED ORDER — DOCUSATE SODIUM 100 MG PO CAPS
100.0000 mg | ORAL_CAPSULE | Freq: Two times a day (BID) | ORAL | Status: DC
Start: 2013-02-14 — End: 2013-02-17
  Administered 2013-02-14 – 2013-02-17 (×6): 100 mg via ORAL
  Filled 2013-02-14 (×7): qty 1

## 2013-02-14 MED ORDER — GLYCOPYRROLATE 0.2 MG/ML IJ SOLN
INTRAMUSCULAR | Status: AC
  Filled 2013-02-14: qty 3

## 2013-02-14 MED ORDER — MENTHOL 3 MG MT LOZG
1.0000 | LOZENGE | OROMUCOSAL | Status: DC | PRN
Start: 2013-02-14 — End: 2013-02-17

## 2013-02-14 MED ORDER — ALUM & MAG HYDROXIDE-SIMETH 200-200-20 MG/5ML PO SUSP: 30.0000 mL | Freq: Four times a day (QID) | ORAL | Status: DC | PRN

## 2013-02-14 MED ORDER — ONDANSETRON HCL 4 MG/2ML IJ SOLN: 4.0000 mg | Freq: Once | INTRAMUSCULAR | Status: DC | PRN

## 2013-02-14 MED ORDER — PROPOFOL 10 MG/ML IV BOLUS
INTRAVENOUS | Status: AC
  Filled 2013-02-14: qty 20

## 2013-02-14 MED ORDER — SODIUM CHLORIDE 0.9 % IJ SOLN: 3.0000 mL | INTRAMUSCULAR | Status: DC | PRN

## 2013-02-14 MED ORDER — NEOSTIGMINE METHYLSULFATE 1 MG/ML IJ SOLN
INTRAMUSCULAR | Status: AC
  Filled 2013-02-14: qty 10

## 2013-02-14 MED ORDER — SODIUM CHLORIDE 0.9 % IV SOLN: 250.0000 mL | INTRAVENOUS | Status: DC

## 2013-02-14 MED ORDER — CEFAZOLIN SODIUM 1-5 GM-% IV SOLN
INTRAVENOUS | Status: AC
  Filled 2013-02-14: qty 50

## 2013-02-14 MED ORDER — MIDAZOLAM HCL 5 MG/5ML IJ SOLN
INTRAMUSCULAR | Status: DC | PRN
  Administered 2013-02-14: 2 mg via INTRAVENOUS

## 2013-02-14 MED ORDER — OXYCODONE HCL 5 MG PO TABS: 5.0000 mg | ORAL_TABLET | Freq: Once | ORAL | Status: DC | PRN

## 2013-02-14 MED ORDER — THROMBIN 20000 UNITS EX SOLR
CUTANEOUS | Status: DC | PRN
  Administered 2013-02-14: 14:00:00 via TOPICAL

## 2013-02-14 MED ORDER — ONDANSETRON HCL 4 MG/2ML IJ SOLN
INTRAMUSCULAR | Status: DC | PRN
  Administered 2013-02-14: 4 mg via INTRAVENOUS

## 2013-02-14 MED ORDER — BISACODYL 10 MG RE SUPP
10.0000 mg | Freq: Every day | RECTAL | Status: DC | PRN
  Administered 2013-02-17: 10 mg via RECTAL
  Filled 2013-02-14: qty 1

## 2013-02-14 MED ORDER — 0.9 % SODIUM CHLORIDE (POUR BTL) OPTIME
TOPICAL | Status: DC | PRN
  Administered 2013-02-14 (×2): 1000 mL

## 2013-02-14 MED ORDER — THROMBIN 5000 UNITS EX SOLR
OROMUCOSAL | Status: DC | PRN
  Administered 2013-02-14 (×2): via TOPICAL

## 2013-02-14 MED ORDER — PANTOPRAZOLE SODIUM 40 MG IV SOLR
40.0000 mg | Freq: Every day | INTRAVENOUS | Status: DC
  Administered 2013-02-14: 40 mg via INTRAVENOUS
  Filled 2013-02-14 (×2): qty 40

## 2013-02-14 MED ORDER — CEFAZOLIN SODIUM 1-5 GM-% IV SOLN
1.0000 g | Freq: Three times a day (TID) | INTRAVENOUS | Status: AC
  Administered 2013-02-14 – 2013-02-15 (×2): 1 g via INTRAVENOUS
  Filled 2013-02-14 (×3): qty 50

## 2013-02-14 SURGICAL SUPPLY — 86 items
ADH SKN CLS APL DERMABOND .7 (GAUZE/BANDAGES/DRESSINGS) ×2
APL SKNCLS STERI-STRIP NONHPOA (GAUZE/BANDAGES/DRESSINGS) ×1
BAG DECANTER FOR FLEXI CONT (MISCELLANEOUS) ×3 IMPLANT
BENZOIN TINCTURE PRP APPL 2/3 (GAUZE/BANDAGES/DRESSINGS) ×2 IMPLANT
BLADE SURG ROTATE 9660 (MISCELLANEOUS) IMPLANT
BUR MATCHSTICK NEURO 3.0 LAGG (BURR) ×3 IMPLANT
BUR PRECISION FLUTE 5.0 (BURR) ×3 IMPLANT
CANISTER SUCT 3000ML (MISCELLANEOUS) ×3 IMPLANT
CLOSURE WOUND 1/2 X4 (GAUZE/BANDAGES/DRESSINGS) ×1
CONT SPEC 4OZ CLIKSEAL STRL BL (MISCELLANEOUS) ×6 IMPLANT
COVER BACK TABLE 24X17X13 BIG (DRAPES) IMPLANT
COVER TABLE BACK 60X90 (DRAPES) ×3 IMPLANT
DERMABOND ADVANCED (GAUZE/BANDAGES/DRESSINGS) ×4
DERMABOND ADVANCED .7 DNX12 (GAUZE/BANDAGES/DRESSINGS) ×1 IMPLANT
DRAPE C-ARM 42X72 X-RAY (DRAPES) ×6 IMPLANT
DRAPE LAPAROTOMY 100X72X124 (DRAPES) ×3 IMPLANT
DRAPE POUCH INSTRU U-SHP 10X18 (DRAPES) ×3 IMPLANT
DRAPE SURG 17X23 STRL (DRAPES) ×3 IMPLANT
DRESSING TELFA 8X3 (GAUZE/BANDAGES/DRESSINGS) IMPLANT
DRSG OPSITE POSTOP 4X8 (GAUZE/BANDAGES/DRESSINGS) ×2 IMPLANT
DURAPREP 26ML APPLICATOR (WOUND CARE) ×3 IMPLANT
ELECT REM PT RETURN 9FT ADLT (ELECTROSURGICAL) ×3
ELECTRODE REM PT RTRN 9FT ADLT (ELECTROSURGICAL) ×1 IMPLANT
EVACUATOR 1/8 PVC DRAIN (DRAIN) ×2 IMPLANT
GAUZE SPONGE 4X4 16PLY XRAY LF (GAUZE/BANDAGES/DRESSINGS) IMPLANT
GLOVE BIO SURGEON STRL SZ 6.5 (GLOVE) ×3 IMPLANT
GLOVE BIO SURGEON STRL SZ8 (GLOVE) ×6 IMPLANT
GLOVE BIO SURGEONS STRL SZ 6.5 (GLOVE) ×3
GLOVE BIOGEL PI IND STRL 6.5 (GLOVE) IMPLANT
GLOVE BIOGEL PI IND STRL 7.5 (GLOVE) IMPLANT
GLOVE BIOGEL PI IND STRL 8 (GLOVE) ×2 IMPLANT
GLOVE BIOGEL PI IND STRL 8.5 (GLOVE) ×2 IMPLANT
GLOVE BIOGEL PI INDICATOR 6.5 (GLOVE) ×2
GLOVE BIOGEL PI INDICATOR 7.5 (GLOVE) ×2
GLOVE BIOGEL PI INDICATOR 8 (GLOVE) ×4
GLOVE BIOGEL PI INDICATOR 8.5 (GLOVE) ×4
GLOVE ECLIPSE 7.0 STRL STRAW (GLOVE) ×2 IMPLANT
GLOVE ECLIPSE 8.0 STRL XLNG CF (GLOVE) ×10 IMPLANT
GLOVE EXAM NITRILE LRG STRL (GLOVE) IMPLANT
GLOVE EXAM NITRILE MD LF STRL (GLOVE) IMPLANT
GLOVE EXAM NITRILE XL STR (GLOVE) IMPLANT
GLOVE EXAM NITRILE XS STR PU (GLOVE) IMPLANT
GOWN BRE IMP SLV AUR LG STRL (GOWN DISPOSABLE) ×4 IMPLANT
GOWN BRE IMP SLV AUR XL STRL (GOWN DISPOSABLE) ×11 IMPLANT
GOWN STRL REIN 2XL LVL4 (GOWN DISPOSABLE) ×4 IMPLANT
GOWN STRL REUS W/ TWL LRG LVL3 (GOWN DISPOSABLE) IMPLANT
GOWN STRL REUS W/TWL LRG LVL3 (GOWN DISPOSABLE) ×6
HEMOSTAT POWDER SURGIFOAM 1G (HEMOSTASIS) ×4 IMPLANT
KIT BASIN OR (CUSTOM PROCEDURE TRAY) ×3 IMPLANT
KIT POSITION SURG JACKSON T1 (MISCELLANEOUS) ×3 IMPLANT
KIT ROOM TURNOVER OR (KITS) ×3 IMPLANT
MILL MEDIUM DISP (BLADE) ×3 IMPLANT
NDL HYPO 25X1 1.5 SAFETY (NEEDLE) ×1 IMPLANT
NDL SPNL 18GX3.5 QUINCKE PK (NEEDLE) IMPLANT
NEEDLE HYPO 25X1 1.5 SAFETY (NEEDLE) ×3 IMPLANT
NEEDLE SPNL 18GX3.5 QUINCKE PK (NEEDLE) ×12 IMPLANT
NS IRRIG 1000ML POUR BTL (IV SOLUTION) ×5 IMPLANT
PACK LAMINECTOMY NEURO (CUSTOM PROCEDURE TRAY) ×3 IMPLANT
PAD ARMBOARD 7.5X6 YLW CONV (MISCELLANEOUS) ×9 IMPLANT
PATTIES SURGICAL .5 X.5 (GAUZE/BANDAGES/DRESSINGS) IMPLANT
PATTIES SURGICAL .5 X1 (DISPOSABLE) IMPLANT
PATTIES SURGICAL 1X1 (DISPOSABLE) ×4 IMPLANT
ROD ARM15T 70MM (Rod) ×4 IMPLANT
SCREW LOCK (Screw) ×12 IMPLANT
SCREW LOCK 100X5.5X OPN (Screw) IMPLANT
SCREW POLY 45X6.5 (Screw) IMPLANT
SCREW POLY 6.5X45MM (Screw) ×12 IMPLANT
SPONGE GAUZE 4X4 12PLY (GAUZE/BANDAGES/DRESSINGS) ×3 IMPLANT
SPONGE LAP 4X18 X RAY DECT (DISPOSABLE) IMPLANT
SPONGE NEURO XRAY DETECT 1X3 (DISPOSABLE) ×2 IMPLANT
SPONGE SURGIFOAM ABS GEL 100 (HEMOSTASIS) ×3 IMPLANT
STAPLER SKIN PROX WIDE 3.9 (STAPLE) IMPLANT
STRIP CLOSURE SKIN 1/2X4 (GAUZE/BANDAGES/DRESSINGS) ×2 IMPLANT
SUT VIC AB 1 CT1 18XBRD ANBCTR (SUTURE) ×2 IMPLANT
SUT VIC AB 1 CT1 8-18 (SUTURE) ×6
SUT VIC AB 2-0 CT1 18 (SUTURE) ×6 IMPLANT
SUT VIC AB 3-0 SH 8-18 (SUTURE) ×6 IMPLANT
SYR 20ML ECCENTRIC (SYRINGE) ×3 IMPLANT
SYR 3ML LL SCALE MARK (SYRINGE) ×12 IMPLANT
SYR 5ML LL (SYRINGE) IMPLANT
TAPE CLOTH SURG 4X10 WHT LF (GAUZE/BANDAGES/DRESSINGS) ×2 IMPLANT
TOWEL OR 17X24 6PK STRL BLUE (TOWEL DISPOSABLE) ×3 IMPLANT
TOWEL OR 17X26 10 PK STRL BLUE (TOWEL DISPOSABLE) ×3 IMPLANT
TRAP SPECIMEN MUCOUS 40CC (MISCELLANEOUS) ×3 IMPLANT
TRAY FOLEY CATH 14FRSI W/METER (CATHETERS) ×3 IMPLANT
WATER STERILE IRR 1000ML POUR (IV SOLUTION) ×3 IMPLANT

## 2013-02-14 NOTE — Anesthesia Preprocedure Evaluation (Addendum)
Anesthesia Evaluation  Patient identified by MRN, date of birth, ID band Patient awake    Reviewed: Allergy & Precautions, H&P , NPO status , Patient's Chart, lab work & pertinent test results  Airway Mallampati: I TM Distance: >3 FB Neck ROM: Full    Dental   Pulmonary          Cardiovascular     Neuro/Psych    GI/Hepatic GERD-  Medicated and Controlled,Pt has widely metastatic disease from probale upper GI source. With seeding out in T9 vertebrae.   Endo/Other    Renal/GU      Musculoskeletal   Abdominal   Peds  Hematology   Anesthesia Other Findings   Reproductive/Obstetrics                          Anesthesia Physical Anesthesia Plan  ASA: II  Anesthesia Plan: General   Post-op Pain Management:    Induction: Intravenous  Airway Management Planned: Oral ETT  Additional Equipment:   Intra-op Plan:   Post-operative Plan: Extubation in OR  Informed Consent: I have reviewed the patients History and Physical, chart, labs and discussed the procedure including the risks, benefits and alternatives for the proposed anesthesia with the patient or authorized representative who has indicated his/her understanding and acceptance.     Plan Discussed with: CRNA and Surgeon  Anesthesia Plan Comments:         Anesthesia Quick Evaluation

## 2013-02-14 NOTE — Brief Op Note (Signed)
02/14/2013  5:18 PM  PATIENT:  Fred [name]  43 y.o. male  PRE-OPERATIVE DIAGNOSIS:  T/9 mass with spinal cord compression and vertebral body destruction  POST-OPERATIVE DIAGNOSIS:  T/9 mass with spinal cord compression and vertebral body destruction  PROCEDURE:  Procedure(s): Thoracic Nince Decompression and Thoraci Eight-Ten Fusion (N/A) with pedicle screw fixation and T 9 vertebrectomy  SURGEON:  Surgeon(s) and Role:    * Erline Levine, MD - Primary    * Consuella Lose, MD - Assisting  PHYSICIAN ASSISTANT:   ASSISTANTS: Poteat, RN   ANESTHESIA:   general  EBL:  Total I/O In: 4000 [I.V.:4000] Out: 9242 [Urine:280; Blood:1450]  BLOOD ADMINISTERED:none  DRAINS: (Medium) Hemovact drain(s) in the epidural space with  Suction Open   LOCAL MEDICATIONS USED:  LIDOCAINE   SPECIMEN:  Excision  DISPOSITION OF SPECIMEN:  PATHOLOGY  COUNTS:  YES  TOURNIQUET:  * No tourniquets in log *  DICTATION: DICTATION: Patient 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 It was elected to take him to surgery for T9 vertebrectomy and separation surgery to decompress the spinal cord and stabilization with instrumentation.   Procedure: Patient was brought to the operating room and following the smooth and uncomplicated induction of general endotracheal anesthesia he was placed in a prone position on the Wilson frame. 4, 18-gauge spinal needles were taped to her back and an AP radiograph was obtained to localize the T9 level. His back was prepped and draped in the usual sterile fashion with DuraPrep. Area of planned incision was infiltrated with local lidocaine. Incision was made in the midline and carried to the lumbodorsal fascia which was incised bilaterally. The T 9 pedicle was identified on the right with a second X ray.  A total laminectomy of right T 9 was performed with removal of the infiltrated pedicle and at least half of the vertebral body was  removed with decompression of the spinal cord.  The tumor was then peeled away from the spinal cord dura which was widely decompressed. The tumor was quite vascular and bleeding was controlled with surgifoam and gelfoam.  At this point it was felt that all neural elements were well decompressed. The wound was then irrigated with  saline. Hemostasis was assured with bipolar electrocautery and gelfoam, followed by Surgifoam. Pedicle screw fixation was placed at T 8 and T 10 bilaterally with 6.5 x 45 mm screws and 70 mm rods.  A medium Hemovac drain was placed in the epidural space and anchored with a vicryl stitch.The lumbodorsal fascia was closed with 0 Vicryl sutures the subcutaneous tissues reapproximated 2-0 Vicryl inverted sutures and the skin edges were reapproximated with 3-0 Vicryl subcuticular stitch. The wound is dressed with Dermabond and an occlusive dressing. Patient was extubated in the operating room and taken to recovery in stable and satisfactory condition having tolerated her operation well counts were correct at the end of the case.   PLAN OF CARE: Admit to inpatient   PATIENT DISPOSITION:  PACU - hemodynamically stable.   Delay start of Pharmacological VTE agent (>24hrs) due to surgical blood loss or risk of bleeding: yes

## 2013-02-14 NOTE — Transfer of Care (Signed)
Immediate Anesthesia Transfer of Care Note  Patient: Fred [name]  Procedure(s) Performed: Procedure(s): Thoracic Nince Decompression and Thoraci Eight-Ten Fusion (N/A)  Patient Location: PACU  Anesthesia Type:General  Level of Consciousness: awake, alert , oriented and patient cooperative  Airway & Oxygen Therapy: Patient Spontanous Breathing and Patient connected to nasal cannula oxygen  Post-op Assessment: Report given to PACU RN, Post -op Vital signs reviewed and stable and Patient moving all extremities  Post vital signs: Reviewed and stable  Complications: No apparent anesthesia complications

## 2013-02-14 NOTE — Anesthesia Postprocedure Evaluation (Signed)
Anesthesia Post Note  Patient: Fred [name]  Procedure(s) Performed: Procedure(s) (LRB): Thoracic Nince Decompression and Thoraci Eight-Ten Fusion (N/A)  Anesthesia type: general  Patient location: PACU  Post pain: Pain level controlled  Post assessment: Patient's Cardiovascular Status Stable  Last Vitals:  Filed Vitals:   02/14/13 0945  BP: 156/97  Pulse: 95  Temp: 36.8 C  Resp: 18    Post vital signs: Reviewed and stable  Level of consciousness: sedated  Complications: No apparent anesthesia complications

## 2013-02-14 NOTE — Op Note (Signed)
02/14/2013  5:18 PM  PATIENT:  Fred [name]  43 y.o. male  PRE-OPERATIVE DIAGNOSIS:  T/9 mass with spinal cord compression and vertebral body destruction  POST-OPERATIVE DIAGNOSIS:  T/9 mass with spinal cord compression and vertebral body destruction  PROCEDURE:  Procedure(s): Thoracic Nince Decompression and Thoraci Eight-Ten Fusion (N/A) with pedicle screw fixation and T 9 vertebrectomy  SURGEON:  Surgeon(s) and Role:    * Erline Levine, MD - Primary    * Consuella Lose, MD - Assisting  PHYSICIAN ASSISTANT:   ASSISTANTS: Poteat, RN   ANESTHESIA:   general  EBL:  Total I/O In: 4000 [I.V.:4000] Out: 1607 [Urine:280; Blood:1450]  BLOOD ADMINISTERED:none  DRAINS: (Medium) Hemovact drain(s) in the epidural space with  Suction Open   LOCAL MEDICATIONS USED:  LIDOCAINE   SPECIMEN:  Excision  DISPOSITION OF SPECIMEN:  PATHOLOGY  COUNTS:  YES  TOURNIQUET:  * No tourniquets in log *  DICTATION: DICTATION: Patient 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 It was elected to take him to surgery for T9 vertebrectomy and separation surgery to decompress the spinal cord and stabilization with instrumentation.   Procedure: Patient was brought to the operating room and following the smooth and uncomplicated induction of general endotracheal anesthesia he was placed in a prone position on the Wilson frame. 4, 18-gauge spinal needles were taped to her back and an AP radiograph was obtained to localize the T9 level. His back was prepped and draped in the usual sterile fashion with DuraPrep. Area of planned incision was infiltrated with local lidocaine. Incision was made in the midline and carried to the lumbodorsal fascia which was incised bilaterally. The T 9 pedicle was identified on the right with a second X ray.  A total laminectomy of right T 9 was performed with removal of the infiltrated pedicle and at least half of the vertebral body was  removed with decompression of the spinal cord.  The tumor was then peeled away from the spinal cord dura which was widely decompressed. The tumor was quite vascular and bleeding was controlled with surgifoam and gelfoam.  At this point it was felt that all neural elements were well decompressed. The wound was then irrigated with  saline. Hemostasis was assured with bipolar electrocautery and gelfoam, followed by Surgifoam. Pedicle screw fixation was placed at T 8 and T 10 bilaterally with 6.5 x 45 mm screws and 70 mm rods.  A medium Hemovac drain was placed in the epidural space and anchored with a vicryl stitch.The lumbodorsal fascia was closed with 0 Vicryl sutures the subcutaneous tissues reapproximated 2-0 Vicryl inverted sutures and the skin edges were reapproximated with 3-0 Vicryl subcuticular stitch. The wound is dressed with Dermabond and an occlusive dressing. Patient was extubated in the operating room and taken to recovery in stable and satisfactory condition having tolerated her operation well counts were correct at the end of the case.   PLAN OF CARE: Admit to inpatient   PATIENT DISPOSITION:  PACU - hemodynamically stable.   Delay start of Pharmacological VTE agent (>24hrs) due to surgical blood loss or risk of bleeding: yes

## 2013-02-14 NOTE — Progress Notes (Signed)
Awake, alert, conversant.  MAEW with good strength and no numbness.

## 2013-02-15 ENCOUNTER — Encounter (HOSPITAL_COMMUNITY): Payer: Self-pay | Admitting: Neurology

## 2013-02-15 MED ORDER — PANTOPRAZOLE SODIUM 40 MG PO TBEC
40.0000 mg | DELAYED_RELEASE_TABLET | Freq: Every day | ORAL | Status: DC
  Administered 2013-02-16 – 2013-02-17 (×2): 40 mg via ORAL
  Filled 2013-02-15 (×2): qty 1

## 2013-02-15 NOTE — Progress Notes (Signed)
Occupational Therapy Evaluation Patient Details Name: Fred [name] MRN: [id number] DOB: 06/03/1970 Today's Date: 02/15/2013 Time: 3151-7616 OT Time Calculation (min): 36 min  OT Assessment / Plan / Recommendation History of present illness s/p Thoracic Nince Decompression and Thoraci Eight-Ten Fusion  secodnary to 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    Clinical Impression   PTA, pt independent with ADL and mobility and worked as Government social research officer. Per notes, pt with apparent stage IV metastatic CA. Will educate pt/family on AE to increase independence with ADL and mobility. Pt will need 3 in 1 for D/C home. Rec Palliative consult for emotional support and understanding of disease process. Will plan to see in am. Feel pt will be ready for D/C Sunday am.    OT Assessment  Patient needs continued OT Services    Follow Up Recommendations  Supervision/Assistance - 24 hour    Barriers to Discharge      Equipment Recommendations  3 in 1 bedside comode    Recommendations for Other Services  Palliative Consult  Frequency  Min 3X/week    Precautions / Restrictions Precautions Precautions: Back;Fall Precaution Booklet Issued: Yes (comment) Precaution Comments: pt educated on back precautions and reviewed throughout session  Required Braces or Orthoses: Spinal Brace Spinal Brace: Thoracolumbosacral orthotic;Applied in sitting position Restrictions Weight Bearing Restrictions: No   Pertinent Vitals/Pain 7/10 pain - back . Requested pain meds    ADL  Eating/Feeding: Modified independent Where Assessed - Eating/Feeding: Chair Grooming: Supervision/safety;Set up Where Assessed - Grooming: Unsupported standing Upper Body Bathing: Moderate assistance Where Assessed - Upper Body Bathing: Supported sitting Lower Body Bathing: Maximal assistance Where Assessed - Lower Body Bathing: Supported sit to stand Upper Body Dressing: Moderate assistance Where Assessed - Upper Body Dressing:  Supported sitting Lower Body Dressing: Maximal assistance Where Assessed - Lower Body Dressing: Supported sit to Lobbyist: Minimal Print production planner Method: Sit to Loss adjuster, chartered: Comfort height toilet Toileting - Clothing Manipulation and Hygiene: Maximal assistance Where Assessed - Best boy and Hygiene: Sit to stand from 3-in-1 or toilet Equipment Used: Gait belt Transfers/Ambulation Related to ADLs: mod a from lower chair ADL Comments: will benefit from AE; wife educated on donning TLSO - needs more educaiton    OT Diagnosis: Generalized weakness;Acute pain  OT Problem List: Decreased strength;Decreased activity tolerance;Decreased knowledge of use of DME or AE;Decreased knowledge of precautions;Obesity;Pain OT Treatment Interventions: Self-care/ADL training;Energy conservation;DME and/or AE instruction;Therapeutic activities;Patient/family education   OT Goals(Current goals can be found in the care plan section) Acute Rehab OT Goals Patient Stated Goal: to go home this weekend, when i am good and ready OT Goal Formulation: With patient Time For Goal Achievement: 03/01/13 Potential to Achieve Goals: Good  Visit Information  Last OT Received On: 02/15/13 Assistance Needed: +1 History of Present Illness: s/p Thoracic Nince Decompression and Thoraci Eight-Ten Fusion  secodnary to 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        Prior [place] expects to be discharged to:: Private residence Living Arrangements: Spouse/significant other Available Help at Discharge: Family;Available 24 hours/day Type of Home: House Home Access: Stairs to enter [employer] of Steps: 1 Entrance Stairs-Rails: None Home Layout: Two level;Bed/bath upstairs Alternate Level Stairs-Number of Steps: 11 Alternate Level Stairs-Rails: Right Home Equipment: Hand held shower head Prior  Function Level of Independence: Independent Communication Communication: No difficulties Dominant Hand: Right         Vision/Perception  Vision - History Baseline Vision: Wears glasses all the time Perception Perception: Within Functional Limits Praxis Praxis: Intact   Cognition  Cognition Arousal/Alertness: Awake/alert Behavior During Therapy: Anxious Overall Cognitive Status: Within Functional Limits for tasks assessed    Extremity/Trunk Assessment Upper Extremity Assessment Upper Extremity Assessment: Overall WFL for tasks assessed Lower Extremity Assessment Lower Extremity Assessment: Overall WFL for tasks assessed Cervical / Trunk Assessment Cervical / Trunk Assessment: Normal     Mobility Bed Mobility Overal bed mobility: Needs Assistance Bed Mobility: Rolling;Sit to Sidelying Rolling: Min assist Sit to sidelying: Min assist General bed mobility comments: (A) to bring LEs onto bed; max cues for log rolling technique  Transfers Overall transfer level: Needs assistance Equipment used: 2 person hand held assist Transfers: Sit to/from Stand Sit to Stand: Mod assist General transfer comment: pt very anxious with sit to stand transfer from chair; required max mulidirectional cues for sequencing and incr time      Exercise     Balance Balance Overall balance assessment: Needs assistance Sitting-balance support: Feet supported;Single extremity supported Sitting balance-Leahy Scale: Good Standing balance support: During functional activity Standing balance-Leahy Scale: Fair General Comments General comments (skin integrity, edema, etc.): wife educated on proper donning/doffing of TLSO   End of Session OT - End of Session Equipment Utilized During Treatment: Gait belt;Back brace Activity Tolerance: Patient tolerated treatment well Patient left: in bed;with call bell/phone within reach;with family/visitor present Nurse Communication: Mobility  status;Precautions;Patient requests pain meds  GO     Amiley Shishido,HILLARY 02/15/2013, 7:05 PM [facility], OTR/L  [phone] 02/15/2013

## 2013-02-15 NOTE — Clinical Social Work Note (Signed)
Clinical Social Worker received standing order referral for possible ST-SNF placement.  Chart reviewed.  PT/OT ordered and will work with patient today.  Spoke with RN who states that patient has good family support and will likely not have SNF needs at discharge.  CSW signing off - please re consult if social work needs arise.  Barbette Or, [place]

## 2013-02-15 NOTE — Plan of Care (Signed)
Problem: Consults Goal: Diagnosis - Spinal Surgery Outcome: Progressing Thoracic Nince Decompression and Thoraci Eight-Ten Fusion (

## 2013-02-15 NOTE — Evaluation (Addendum)
Physical Therapy Evaluation Patient Details Name: Fred [name] MRN: [id number] DOB: Jan 10, 1970 Today's Date: 02/15/2013 Time: 1528-1600 PT Time Calculation (min): 32 min  PT Assessment / Plan / Recommendation History of Present Illness  s/p Thoracic Nince Decompression and Thoraci Eight-Ten Fusion  secodnary to 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   Clinical Impression  Patient is s/p Thoracic Nine Decompression and Thoracic Eight-Ten Fusion surgery , Secondary to mass found at 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 Surgery has resulted in the deficits listed below (see PT Problem List). Patient will benefit from skilled PT to increase their independence and safety with mobility (while adhering to their precautions) to allow discharge home with wife. Anticipate good progress by pt. Pt very independent prior to admission. Patient needs to practice stairs next session.  Pt hopeful to D/C over weekend.     PT Assessment  Patient needs continued PT services    Follow Up Recommendations  No PT follow up;Supervision/Assistance - 24 hour    Does the patient have the potential to tolerate intense rehabilitation      Barriers to Discharge        Equipment Recommendations  3in1 (PT)    Recommendations for Other Services     Frequency Min 5X/week    Precautions / Restrictions Precautions Precautions: Back;Fall Precaution Booklet Issued: Yes (comment) Precaution Comments: pt educated on back precautions and reviewed throughout session  Required Braces or Orthoses: Spinal Brace Spinal Brace: Thoracolumbosacral orthotic;Applied in sitting position Restrictions Weight Bearing Restrictions: No   Pertinent Vitals/Pain C/o pain at incision site; 5/10; RN notified.      Mobility  Bed Mobility Overal bed mobility: Needs Assistance Bed Mobility: Rolling;Sit to Sidelying Rolling: Min assist Sit to sidelying: Min assist General bed mobility comments: (A) to bring LEs onto bed; max  cues for log rolling technique  Transfers Overall transfer level: Needs assistance Equipment used: 2 person hand held assist Transfers: Sit to/from Stand Sit to Stand: Mod assist General transfer comment: pt very anxious with sit to stand transfer from chair; required max mulidirectional cues for sequencing and incr time  Ambulation/Gait Ambulation/Gait assistance: Min guard Ambulation Distance (Feet): 500 Feet Assistive device: 1 person hand held assist;None Gait Pattern/deviations: Step-through pattern;Decreased stride length;Wide base of support Gait velocity: decreased; guarded due to pain Gait velocity interpretation: Below normal speed for age/gender General Gait Details: pt progressed to min guard (A) and able to ambulate without UE support; min cues during ambulation for back precautions          PT Diagnosis: Abnormality of gait;Acute pain  PT Problem List: Decreased mobility;Decreased balance;Decreased knowledge of precautions;Pain PT Treatment Interventions: DME instruction;Gait training;Stair training;Functional mobility training;Therapeutic activities;Therapeutic exercise;Balance training;Neuromuscular re-education;Patient/family education     PT Goals(Current goals can be found in the care plan section) Acute Rehab PT Goals Patient Stated Goal: to go home this weekend, when i am good and ready PT Goal Formulation: With patient/family Time For Goal Achievement: 03/01/13 Potential to Achieve Goals: Good  Visit Information  Last PT Received On: 02/15/13 Assistance Needed: +1 History of Present Illness: s/p Thoracic Nince Decompression and Thoraci Eight-Ten Fusion  secodnary to 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        Prior [place] expects to be discharged to:: Private residence Living Arrangements: Spouse/significant other Available Help at Discharge: Family;Available 24 hours/day Type of Home: House Home Access:  Stairs to enter [employer] of Steps: 1 Entrance Stairs-Rails: None Home  Layout: Two level;Bed/bath upstairs Alternate Level Stairs-Number of Steps: 11 Alternate Level Stairs-Rails: Right Home Equipment: Hand held shower head Prior Function Level of Independence: Independent Communication Communication: No difficulties Dominant Hand: Right    Cognition  Cognition Arousal/Alertness: Awake/alert Behavior During Therapy: Anxious Overall Cognitive Status: Within Functional Limits for tasks assessed    Extremity/Trunk Assessment Upper Extremity Assessment Upper Extremity Assessment: Defer to OT evaluation Lower Extremity Assessment Lower Extremity Assessment: Overall WFL for tasks assessed Cervical / Trunk Assessment Cervical / Trunk Assessment: Normal   Balance Balance Overall balance assessment: Needs assistance Sitting-balance support: Feet supported;Single extremity supported Sitting balance-Leahy Scale: Good Standing balance support: During functional activity;No upper extremity supported Standing balance-Leahy Scale: Fair General Comments General comments (skin integrity, edema, etc.): wife educated on proper donning/doffing of TLSO  End of Session PT - End of Session Equipment Utilized During Treatment: Gait belt;Back brace Activity Tolerance: Patient tolerated treatment well Patient left: in bed;with call bell/phone within reach;with family/visitor present Nurse Communication: Mobility status;Precautions;Other (comment) (back brace )  GP     Gustavus Bryant, [place] [phone] 02/15/2013, 4:52 PM

## 2013-02-15 NOTE — Progress Notes (Signed)
Subjective: Patient reports doing well  Objective: Vital signs in last 24 hours: Temp:  [98 F (36.7 C)-98.8 F (37.1 C)] 98.8 F (37.1 C) (02/13 0328) Pulse Rate:  [64-105] 87 (02/13 0700) Resp:  [11-26] 19 (02/13 0700) BP: (114-156)/(84-102) 146/98 mmHg (02/13 0700) SpO2:  [92 %-100 %] 97 % (02/13 0700) Weight:  [125.9 kg (277 lb 9 oz)] 125.9 kg (277 lb 9 oz) (02/13 0700)  Intake/Output from previous day: 02/12 0701 - 02/13 0700 In: 5878.8 [P.O.:480; I.V.:5298.8; IV Piggyback:100] Out: 3425 [Urine:1680; Drains:295; Blood:1450] Intake/Output this shift:    Physical Exam: Full strength both legs.  No numbness.  Dressing CDI  Lab Results:  Recent Labs  02/14/13 1505  HGB 11.6*  HCT 34.0*   BMET  Recent Labs  02/14/13 1505  NA 133*  K 3.9  GLUCOSE 108*    Studies/Results: Dg Thoracic Spine 2 View  02/14/2013   CLINICAL DATA:  T9 vertebrectomy and T8-T10 fusion  EXAM: DG C-ARM 1-60 MIN; THORACIC SPINE - 2 VIEW  COMPARISON:  NM PET IMAGE INITIAL (PI) SKULL BASE TO THIGH dated 02/07/2013; CT BIOPSY dated 02/01/2013; CT CHEST W/CM dated 01/31/2013  FLUOROSCOPY TIME:  21 seconds  FINDINGS: Two spot radiographic images of the thoracic spine are provided for review. Exact spinal labeling is difficult secondary exclusion of the lumbosacral junction.  Post bilateral pedicular screw fixation of the presumed T8 and T10 vertebral bodies.  A linear presumed surgical drain overlies the lateral aspect of the mid operative site. Additional support apparatus is seen posterior to the operative site  IMPRESSION: Post mid thoracic paraspinal fusion.   Electronically Signed   By: Sandi Mariscal M.D.   On: 02/14/2013 16:14   Dg Thoracic Spine 2 View  02/14/2013   CLINICAL DATA:  Widely metastatic adenocarcinoma. T9 vertebrectomy and T8-T10 fusion  EXAM: THORACIC SPINE - 2 VIEW  COMPARISON:  PET-CT 02/07/2013  FINDINGS: Two limited intraoperative radiographs obtained during T9 vertebrectomy and  T8-T10 fusion.  IMPRESSION: Intraoperative radiographs as above.   Electronically Signed   By: Jacqulynn Cadet M.D.   On: 02/14/2013 16:11   Dg C-arm 1-60 Min  02/14/2013   CLINICAL DATA:  T9 vertebrectomy and T8-T10 fusion  EXAM: DG C-ARM 1-60 MIN; THORACIC SPINE - 2 VIEW  COMPARISON:  NM PET IMAGE INITIAL (PI) SKULL BASE TO THIGH dated 02/07/2013; CT BIOPSY dated 02/01/2013; CT CHEST W/CM dated 01/31/2013  FLUOROSCOPY TIME:  21 seconds  FINDINGS: Two spot radiographic images of the thoracic spine are provided for review. Exact spinal labeling is difficult secondary exclusion of the lumbosacral junction.  Post bilateral pedicular screw fixation of the presumed T8 and T10 vertebral bodies.  A linear presumed surgical drain overlies the lateral aspect of the mid operative site. Additional support apparatus is seen posterior to the operative site  IMPRESSION: Post mid thoracic paraspinal fusion.   Electronically Signed   By: Sandi Mariscal M.D.   On: 02/14/2013 16:14    Assessment/Plan: Doing well.  Transfer to 4N.  Work with PT.  Potentially D/C drain in AM.  Anticipate D/C this weekend.    LOS: 1 day    Peggyann Shoals, MD 02/15/2013, 8:08 AM

## 2013-02-15 NOTE — Progress Notes (Signed)
   CARE MANAGEMENT NOTE 02/15/2013  Patient:  Fred [name], Fred [name]   Account Number:  [id number]  Date Initiated:  02/15/2013  Documentation initiated by:  Olga Coaster  Subjective/Objective Assessment:   ADMITTED FOR BACK SURGERY     Action/Plan:   CM FOLLOWING FOR DCP   Anticipated DC Date:  02/18/2013   Anticipated DC Plan:  AWAITING FOR PT/OT EVAL FOR DISPOSITION NEEDS / WITH ATTENDING MD APPROVAL     DC Planning Services  CM consult          Status of service:  In process, will continue to follow Medicare Important Message given?  NA - LOS <3 / Initial given by admissions (If response is "NO", the following Medicare IM given date fields will be blank)  Per UR Regulation:  Reviewed for med. necessity/level of care/duration of stay  Comments:  2/13/2015Mindi Slicker RN,BSN,MHA [phone]

## 2013-02-16 NOTE — Progress Notes (Signed)
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    PT Comments   Pt significantly less anxious this date. Pt and spouse demo'd excellent techniques regarding transfers, ambulation and stair negotiation. Pt and spouse re-educated on proper fitting of back brace and how to adjust it in sitting. Pt and spouse very appreciative of our services. Pt safe to d/c home with family and recommended DME once medically stable.   Follow Up Recommendations  No PT follow up;Supervision/Assistance - 24 hour     Does the patient have the potential to tolerate intense rehabilitation     Barriers to Discharge        Equipment Recommendations  Rolling walker with 5" wheels;3in1 (PT)    Recommendations for Other Services    Frequency Min 5X/week   Progress towards PT Goals Progress towards PT goals: Progressing toward goals  Plan Current plan remains appropriate    Precautions / Restrictions Precautions Precautions: Back;Fall Precaution Comments: pt able to recall 3/3 Required Braces or Orthoses: Spinal Brace Spinal Brace: Thoracolumbosacral orthotic;Applied in sitting position Restrictions Weight Bearing Restrictions: No   Pertinent Vitals/Pain Minimal back discomfort    Mobility  Bed Mobility Overal bed mobility: Needs Assistance Bed Mobility: Sit to Sidelying Sit to sidelying: Mod assist (for LEs) General bed mobility comments: v/c's for technique, wife assisted with LE management Transfers Overall transfer level: Needs assistance Equipment used: Rolling walker (2 wheeled) Transfers: Sit to/from Stand Sit to Stand: Min assist General transfer comment: increased time, v/c's for deep  breaths to relax but overall good technique Ambulation/Gait Ambulation/Gait assistance: Supervision Ambulation Distance (Feet): 600 Feet Assistive device: 4-wheeled walker Gait Pattern/deviations: Step-through pattern Gait velocity: decreased; guarded due to pain General Gait Details: safe use of RW Stairs: Yes Stairs assistance: Min guard Stair Management: One rail Right;Sideways Number of Stairs: 12 General stair comments: increased time but demo'd safe technique, wife present to observe    Exercises     PT Diagnosis:    PT Problem List:   PT Treatment Interventions:     PT Goals (current goals can now be found in the care plan section) Acute Rehab PT Goals Patient Stated Goal: to go home this weekend, when i am good and ready  Visit Information  Last PT Received On: 02/16/13 Assistance Needed: +1 History of Present Illness: s/p Thoracic Nince Decompression and Thoraci Eight-Ten Fusion  secodnary to 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     Subjective Data  Patient Stated Goal: to go home this weekend, when i am good and ready   Cognition  Cognition Arousal/Alertness: Awake/alert Behavior During Therapy: WFL for tasks assessed/performed Overall Cognitive Status: Within Functional Limits for tasks assessed    Balance     End of Session PT - End of Session Equipment Utilized During Treatment: Gait belt;Back brace Activity Tolerance: Patient tolerated treatment well Patient left: in bed;with call bell/phone within reach;with family/visitor present Nurse Communication: Mobility status;Precautions;Other (comment)   GP     Ahmiya Abee Marie 02/16/2013, 1:45 PM  Kittie Plater, PT, DPT Pager #: [phone] Office #: [phone]

## 2013-02-16 NOTE — Progress Notes (Signed)
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    OT comments  Tolerating skilled OT.  Wife present and was very active in family ed and was able to return safe demo of tech.  Donned brace, assisted with bed mobility, and all aspects of toileting.  Planning for d/c home tom. And will benefit from one more tx. Session with emphasis on family ed tom. Prior to d/c home.  Follow Up Recommendations  Supervision/Assistance - 24 hour           Equipment Recommendations  3 in 1 bedside comode        Frequency Min 3X/week   Progress towards OT Goals Progress towards OT goals: Progressing toward goals  Plan Discharge plan remains appropriate    Precautions / Restrictions Precautions Precautions: Back;Fall Precaution Comments: pt educated on back precautions and reviewed throughout session  Required Braces or Orthoses: Spinal Brace Spinal Brace: Thoracolumbosacral orthotic;Applied in sitting position   Pertinent Vitals/Pain Pt. Did not rate but states "its stable with meds"    ADL  Grooming: Simulated;Min guard Where Assessed - Grooming: Supported standing Upper Body Dressing: Performed;Maximal assistance Toilet Transfer: Performed;Minimal assistance Toilet Transfer Method: Sit to stand Toilet Transfer Equipment: Raised toilet seat with arms (or 3-in-1 over toilet) Toileting - Clothing Manipulation and Hygiene: Performed;Maximal assistance Where Assessed - Best boy and Hygiene: Standing Tub/Shower Transfer: Simulated;Minimal assistance Tub/Shower Transfer Method: Ambulating;Anterior-posterior Tub/Shower Transfer Equipment: Walk in shower Equipment  Used: Back brace;Rolling walker Transfers/Ambulation Related to ADLs: min/mod a from bed and 3-n-1 over commode ADL Comments: pt. and spouse decline AE state wife able to assist, wife able to don TLSO with no cues or assistance needed.  pt. and spouse also verbalize that she will assist with peri care after toileting and are not interested in toilet aides     OT Goals(current goals can now be found in the care plan section)    Visit Information  Last OT Received On: 02/16/13 History of Present Illness: s/p Thoracic Nince Decompression and Thoraci Eight-Ten Fusion  secodnary to 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     Subjective Data   "i feel like i am doing much better today"          Cognition  Cognition Arousal/Alertness: Awake/alert Behavior During Therapy: WFL for tasks assessed/performed Overall Cognitive Status: Within Functional Limits for tasks assessed    Mobility  Bed Mobility Overal bed mobility: Needs Assistance General bed mobility comments: wife present and was able to demonstrate assistance with log roll initiation and bringing b les off of bed while bringing trunk upright.  educated on safe hand placement during transfer to eliminate injuring pt. or spouse, she was able to return demo safely Transfers Overall transfer level: Needs assistance Equipment used: Rolling walker (2 wheeled) Transfers: Sit to/from Stand Sit to Stand: Min assist;Mod assist General transfer comment: initially anxious but both report that he did better today with relaxing and sequencing of steps              End of Session OT - End of Session Equipment Utilized During Treatment: Back brace;Rolling walker Activity Tolerance: Patient tolerated treatment well Patient left: in bed;with family/visitor present;with call bell/phone within reach  Janice Coffin, COTA/L 02/16/2013, 9:24 AM

## 2013-02-16 NOTE — Progress Notes (Signed)
No issues overnight. Pt reports some incisional pain, no other c/o. Ambulating well, tolerating diet, voiding normally.  EXAM:  BP 121/85  Pulse 81  Temp(Src) 98.1 F (36.7 C) (Oral)  Resp 20  Ht 5\' 11"  (1.803 m)  Wt 125.9 kg (277 lb 9 oz)  BMI 38.73 kg/m2  SpO2 96%  Awake, alert, oriented  Speech fluent, appropriate  CN grossly intact  5/5 BUE/BLE   IMPRESSION:  43 y.o. male s/p T9 decompression, T8-10 stabilization. Neurologically intact, recovering well.   PLAN: - Cont PT/OT today and tomorrow. Pt and wife prefer to go home after therapy tomorrow. - Dex taper upon d/c

## 2013-02-17 LAB — TYPE AND SCREEN
ABO/RH(D): O POS
Antibody Screen: NEGATIVE
Unit division: 0
Unit division: 0

## 2013-02-17 MED ORDER — OXYCODONE-ACETAMINOPHEN 5-325 MG PO TABS: 1.0000 | ORAL_TABLET | ORAL | Status: DC | PRN

## 2013-02-17 NOTE — Discharge Summary (Signed)
  Physician Discharge Summary  Patient ID: Fred [name] MRN: [id number] DOB/AGE: June 09, 1970 44 y.o.  Admit date: 02/14/2013 Discharge date: 02/17/2013  Admission Diagnoses:  Discharge Diagnoses:  Active Problems:   Metastatic cancer to spine   Discharged Condition: good  Hospital Course: Patient found to have widely metastatic cancer with involvement of T9. Admitted 3 days ago for thoracic lam with screw fixation. Did well with good pain relief. Ambulated well. Home pod 3, specific instructions given.  Consults: None  Significant Diagnostic Studies: none  Treatments: surgery: T 9 corpectomy with screw fixation  Discharge Exam: Blood pressure 148/82, pulse 79, temperature 97.6 F (36.4 C), temperature source Oral, resp. rate 20, height 5\' 11"  (1.803 m), weight 125.9 kg (277 lb 9 oz), SpO2 96.00%. Incision/Wound:clean and dry; neuro status stable  Disposition: Final discharge disposition not confirmed   Future Appointments Provider Department Dept Phone   02/21/2013 11:30 AM [facility] [phone]   02/21/2013 12:00 PM Curt Bears, MD [facility] [phone]       Medication List    ASK your doctor about these medications       dexamethasone 4 MG tablet  Commonly known as:  DECADRON  Take 4 mg by mouth 3 (three) times daily.     oxyCODONE-acetaminophen 5-325 MG per tablet  Commonly known as:  PERCOCET/ROXICET  Take 1 tablet by mouth every 6 (six) hours as needed for severe pain.         At home rest most of the time. Get up 9 or 10 times each day and take a 15 or 20 minute walk. No riding in the car and to your first postoperative appointment. If you have neck surgery you may shower from the chest down starting on the third postoperative day. If you had back surgery he may start showering on the third postoperative day with saran wrap wrapped around your incisional area 3 times. After  the shower remove the saran wrap. Take pain medicine as needed and other medications as instructed. Call my office for an appointment.  SignedFaythe Ghee, MD 02/17/2013, 10:44 AM

## 2013-02-17 NOTE — Progress Notes (Signed)
Occupational Therapy Treatment Patient Details Name: Fred [name] MRN: [id number] DOB: 07-Jul-1970 Today's Date: 02/17/2013 Time: 7846-9629 OT Time Calculation (min): 29 min  OT Assessment / Plan / Recommendation  History of present illness s/p Thoracic Nince Decompression and Thoraci Eight-Ten Fusion  secodnary to 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    OT comments  Pt moving well during session. Education provided to pt and wife. Feel pt is safe to d/c home, from OT standpoint, with wife to assist.  Follow Up Recommendations  Supervision/Assistance - 24 hour    Barriers to Discharge       Equipment Recommendations  3 in 1 bedside comode    Recommendations for Other Services    Frequency Min 3X/week   Progress towards OT Goals Progress towards OT goals: Progressing toward goals  Plan Discharge plan remains appropriate    Precautions / Restrictions Precautions Precautions: Back;Fall Precaution Comments: Pt able to state 2/3 back precautions Required Braces or Orthoses: Spinal Brace Spinal Brace: Thoracolumbosacral orthotic;Applied in sitting position Restrictions Weight Bearing Restrictions: No   Pertinent Vitals/Pain Pain 2-3. Increased activity during session. Pt comfortable in chair at end of session.     ADL  Grooming: Wash/dry hands;Supervision/safety Where Assessed - Grooming: Supported standing Upper Body Dressing:  (wife donned back brace-no cues needed) Where Assessed - Upper Body Dressing: Unsupported sitting Lower Body Dressing: Set up;Supervision/safety (donned sock with sockaid) Where Assessed - Lower Body Dressing: Supported sitting Toilet Transfer: Min Psychiatric nurse Method: Sit to Loss adjuster, chartered: Other (comment) (from bed; stood at toilet to urinate) Toileting - Water quality scientist and Hygiene: Minimal assistance (clothing) Where Assessed - Camera operator Manipulation and Hygiene: Standing Tub/Shower Transfer: Scientist, research (life sciences) Method: Therapist, art: Walk in shower Equipment Used: Back brace;Gait belt;Rolling walker;Reacher;Long-handled sponge;Long-handled shoe horn;Sock aid Transfers/Ambulation Related to ADLs: Min guard ADL Comments: Recommended wife having rugs picked up in house and spoke about safe shoe wear. Discussed use of bag on walker to carry items. Pt and wife interested in AE for LB ADLs considering back precautions are typically maintained for a while. Recommended standing in front of chair/bed with walker in front when pulling up LB clothing.  Educated on use of two cups for teeth care and having items on right side of sink to avoid breaking precations. OT recommended pt sleeping with pillow between legs if he sleeps on side.    OT Diagnosis:    OT Problem List:   OT Treatment Interventions:     OT Goals(current goals can now be found in the care plan section) Acute Rehab OT Goals Patient Stated Goal: not stated OT Goal Formulation: With patient Time For Goal Achievement: 03/01/13 Potential to Achieve Goals: Good ADL Goals Pt Will Perform Upper Body Dressing: with caregiver independent in assisting;sitting;with supervision Pt Will Perform Lower Body Dressing: with caregiver independent in assisting;with min guard assist;sit to/from stand;with adaptive equipment Pt Will Transfer to Toilet: with modified independence;ambulating Pt Will Perform Toileting - Clothing Manipulation and hygiene: with supervision;with adaptive equipment;sit to/from stand Pt Will Perform Tub/Shower Transfer: ambulating;with caregiver independent in assisting;Shower transfer;with supervision (with use of 3 in 1) Additional ADL Goal #1: caregiver will independently donn/doff TLSO in sitting and verbalize understanding of back precautions  Visit Information  Last OT Received On: 02/17/13 Assistance Needed: +1 History of Present Illness: s/p Thoracic Nince Decompression and  Thoraci Eight-Ten Fusion  secodnary to 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  Subjective Data      Prior Functioning       Cognition  Cognition Arousal/Alertness: Awake/alert Behavior During Therapy: WFL for tasks assessed/performed Overall Cognitive Status: Within Functional Limits for tasks assessed    Mobility  Bed Mobility Overal bed mobility: Needs Assistance Bed Mobility: Sidelying to Sit Sidelying to sit: Min assist General bed mobility comments: Wife assisted with trunk. OT educated on hand positioning of wife to help assist pt. Transfers Overall transfer level: Needs assistance Equipment used: Rolling walker (2 wheeled) Transfers: Sit to/from Stand Sit to Stand: Min guard General transfer comment: cues for technique.    Exercises      Balance    End of Session OT - End of Session Equipment Utilized During Treatment: Gait belt;Rolling walker;Back brace Activity Tolerance: Patient tolerated treatment well Patient left: in chair;with call bell/phone within reach;with family/visitor present  GO     Benito Mccreedy OTR/L [phone] 02/17/2013, 8:53 AM

## 2013-02-18 ENCOUNTER — Telehealth: Payer: Self-pay | Admitting: Medical Oncology

## 2013-02-18 NOTE — Telephone Encounter (Signed)
Path needs new  completed requisition for [employer]. fax to [phone]. Note to dana and Dr Julien Nordmann.

## 2013-02-19 ENCOUNTER — Telehealth: Payer: Self-pay | Admitting: *Deleted

## 2013-02-19 ENCOUNTER — Encounter: Payer: Self-pay | Admitting: *Deleted

## 2013-02-19 NOTE — Progress Notes (Signed)
[facility] request sent again.

## 2013-02-19 NOTE — Telephone Encounter (Signed)
Pt's wife called stating that last night pt was vomiting, had black stools and was experiencing acid reflux.  Per Dr Vista Mink, this is most likely r/t his esophageal involvement and he should inform his gastroenterologist on Thursday 02/21/13.  If pt begins to experience any bright red bloody stool he needs to report to the ED.  We will also see pt on 2/19 and can assess him further as well.  She verbalized understanding.  SLJ

## 2013-02-21 ENCOUNTER — Other Ambulatory Visit: Payer: BC Managed Care – PPO

## 2013-02-21 ENCOUNTER — Other Ambulatory Visit: Payer: Self-pay | Admitting: *Deleted

## 2013-02-21 ENCOUNTER — Ambulatory Visit: Payer: BC Managed Care – PPO | Admitting: Internal Medicine

## 2013-02-21 ENCOUNTER — Telehealth: Payer: Self-pay | Admitting: Internal Medicine

## 2013-02-21 NOTE — Telephone Encounter (Signed)
s.w. pt and advised on 2.26.15 appt....pt was ok and aware

## 2013-02-22 ENCOUNTER — Telehealth: Payer: Self-pay | Admitting: *Deleted

## 2013-02-22 ENCOUNTER — Other Ambulatory Visit: Payer: Self-pay | Admitting: Gastroenterology

## 2013-02-22 ENCOUNTER — Other Ambulatory Visit: Payer: Self-pay | Admitting: Radiation Therapy

## 2013-02-22 DIAGNOSIS — C7949 Secondary malignant neoplasm of other parts of nervous system: Principal | ICD-10-CM

## 2013-02-22 DIAGNOSIS — C7931 Secondary malignant neoplasm of brain: Secondary | ICD-10-CM

## 2013-02-22 NOTE — Telephone Encounter (Signed)
Office note from Dr Michail Sermon at [facility] dated 02/21/13 given to Dr Vista Mink to review.  SLJ

## 2013-02-23 NOTE — Progress Notes (Signed)
This encounter was created in error - please disregard.

## 2013-02-25 ENCOUNTER — Telehealth: Payer: Self-pay | Admitting: Internal Medicine

## 2013-02-25 ENCOUNTER — Telehealth: Payer: Self-pay | Admitting: Medical Oncology

## 2013-02-25 NOTE — Telephone Encounter (Signed)
pt MD office called to advised that pt has appt that will leave him bedriddien for the day and wanted to r/s...done...they will contact pt with new d.t.

## 2013-02-25 NOTE — Telephone Encounter (Signed)
Asking about special study test results . I told her they were not available and to keep f/u appt.

## 2013-02-28 ENCOUNTER — Other Ambulatory Visit: Payer: BC Managed Care – PPO

## 2013-02-28 ENCOUNTER — Ambulatory Visit
Admission: RE | Admit: 2013-02-28 | Discharge: 2013-02-28 | Disposition: A | Discharge status: Home / Self Care | Payer: BC Managed Care – PPO | Source: Ambulatory Visit | Attending: Radiation Oncology | Admitting: Radiation Oncology

## 2013-02-28 ENCOUNTER — Ambulatory Visit: Payer: BC Managed Care – PPO | Admitting: Internal Medicine

## 2013-02-28 ENCOUNTER — Encounter: Payer: Self-pay | Admitting: Radiation Oncology

## 2013-02-28 VITALS — BP 138/76 | HR 88

## 2013-02-28 VITALS — BP 132/99 | HR 89 | Temp 98.0°F | Resp 18

## 2013-02-28 DIAGNOSIS — C7931 Secondary malignant neoplasm of brain: Secondary | ICD-10-CM

## 2013-02-28 DIAGNOSIS — C159 Malignant neoplasm of esophagus, unspecified: Secondary | ICD-10-CM | POA: Insufficient documentation

## 2013-02-28 DIAGNOSIS — C7951 Secondary malignant neoplasm of bone: Secondary | ICD-10-CM | POA: Insufficient documentation

## 2013-02-28 DIAGNOSIS — Z510 Encounter for antineoplastic radiation therapy: Secondary | ICD-10-CM | POA: Insufficient documentation

## 2013-02-28 DIAGNOSIS — C7952 Secondary malignant neoplasm of bone marrow: Secondary | ICD-10-CM

## 2013-02-28 DIAGNOSIS — C7949 Secondary malignant neoplasm of other parts of nervous system: Principal | ICD-10-CM

## 2013-02-28 DIAGNOSIS — M546 Pain in thoracic spine: Secondary | ICD-10-CM

## 2013-02-28 DIAGNOSIS — M549 Dorsalgia, unspecified: Secondary | ICD-10-CM | POA: Insufficient documentation

## 2013-02-28 MED ORDER — IOHEXOL 300 MG/ML  SOLN
10.0000 mL | Freq: Once | INTRAMUSCULAR | Status: AC | PRN
  Administered 2013-02-28: 10 mL via INTRATHECAL

## 2013-02-28 MED ORDER — DIAZEPAM 5 MG PO TABS
10.0000 mg | ORAL_TABLET | Freq: Once | ORAL | Status: AC
  Administered 2013-02-28: 10 mg via ORAL

## 2013-02-28 NOTE — Discharge Instructions (Signed)

## 2013-02-28 NOTE — Progress Notes (Signed)
  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  NARRATIVE:  The patient was brought to the [facility].  Identity was confirmed.  All relevant records and images related to the planned course of therapy were reviewed.  The patient freely provided informed written consent to proceed with treatment after reviewing the details related to the planned course of therapy. The consent form was witnessed and verified by the simulation staff.  Then, the patient was set-up in a stable reproducible  supine position for radiation therapy.  A BodyFix immobilization pillow was fabricated for reproducible positioning.  Surface markings were placed.  The CT images were loaded into the planning software.  The gross target volumes (GTV) and planning target volumes (PTV) were delinieated, and avoidance structures were contoured.  Treatment planning then occurred.  The radiation prescription was entered and confirmed.  A total of two complex treatment devices were fabricated in the form of the BodyFix immobilization pillow and a neck accuform cushion.  I have requested : 3D Simulation  I have requested a DVH of the following structures: targets and all normal structures near the target as outlined on the radiation plan to maintain doses in adherence with established limits  PLAN:  The patient will receive 18 Gy in 1 fraction.  ________________________________  Sheral Apley [name], M.D.

## 2013-02-28 NOTE — Progress Notes (Signed)
CT simulation complete. Vitals stable. Patient report mild back pain related to laying on hard simulation table. Reports taking pain medication and decadron around 1530. Wife assisting patient while he dresses. Patient understanding to lay flat for the remainder of the evening and contact staff with future needs.

## 2013-03-04 ENCOUNTER — Encounter: Payer: Self-pay | Admitting: *Deleted

## 2013-03-04 ENCOUNTER — Telehealth: Payer: Self-pay | Admitting: Radiation Oncology

## 2013-03-04 NOTE — Telephone Encounter (Signed)
Placed purple folder with [employer] paperwork in Dr. Johny Shears inbox for completion.

## 2013-03-04 NOTE — [facility] Oncology Navigator Note (Unsigned)
Called pathology dept at [facility] to check on [employer] request.  Fred [name] stated request was sent last week.  I checked [employer] [facility] secure website to see if results completed.  They are not as of this am.  Dr. Julien Nordmann aware

## 2013-03-06 ENCOUNTER — Ambulatory Visit: Payer: BC Managed Care – PPO | Admitting: Radiation Oncology

## 2013-03-07 ENCOUNTER — Encounter: Payer: Self-pay | Admitting: Radiation Oncology

## 2013-03-07 ENCOUNTER — Ambulatory Visit
Admission: RE | Admit: 2013-03-07 | Discharge: 2013-03-07 | Disposition: A | Discharge status: Home / Self Care | Payer: BC Managed Care – PPO | Source: Ambulatory Visit | Attending: Radiation Oncology | Admitting: Radiation Oncology

## 2013-03-07 ENCOUNTER — Encounter: Payer: Self-pay | Admitting: *Deleted

## 2013-03-07 VITALS — BP 130/93 | HR 113 | Temp 98.4°F | Resp 20

## 2013-03-07 DIAGNOSIS — C7931 Secondary malignant neoplasm of brain: Secondary | ICD-10-CM

## 2013-03-07 DIAGNOSIS — C7949 Secondary malignant neoplasm of other parts of nervous system: Principal | ICD-10-CM

## 2013-03-07 MED ORDER — DEXAMETHASONE 4 MG PO TABS: 4.0000 mg | ORAL_TABLET | ORAL | Status: AC

## 2013-03-07 NOTE — Progress Notes (Signed)
  Radiation Oncology         [phone] ________________________________  Spinal Stereotactic Radiosurgery Procedure Note  Name: Fred [name] MRN: [id number]  Date: 03/07/2013  DOB: 1970/11/24  SPECIAL TREATMENT PROCEDURE  3D TREATMENT PLANNING AND DOSIMETRY:  The patient's radiation plan was reviewed and approved by neurosurgery and radiation oncology prior to treatment.  It showed 3-dimensional radiation distributions overlaid onto the planning CT/MRI image set.  The [facility] for the target structures as well as the organs at risk were reviewed. The documentation of the 3D plan and dosimetry are filed in the radiation oncology EMR.  NARRATIVE:  Fred [name] was brought to the TrueBeam stereotactic radiation treatment machine and placed supine on the CT couch. The patient was precisely re-positioned in their BodyFix immobilization device, and the patient was set up for stereotactic radiosurgery.  Neurosurgery was present for the set-up and delivery  SIMULATION VERIFICATION:  In the couch zero-angle position, the patient underwent Exactrac imaging using the Brainlab system with orthogonal KV images to position the target accounting for translation and rotational factors.  These were carefully aligned and repeated to confirm treatment position.  Then, cone beam CT was performed to help further verify placement and make any final translational shifts.  SPECIAL TREATMENT PROCEDURE: Fred [name] received stereotactic radiosurgery to the following targets: The targeted metastasis in the right side of vertebral body T9 was treated using 3 Rapid Arc VMAT Beams to a prescription dose of 18 Gy.  STEREOTACTIC TREATMENT MANAGEMENT:  Following delivery, the patient was transported to nursing in stable condition and monitored for possible acute effects.  Vital signs were recorded BP 130/93  Pulse 113  Temp(Src) 98.4 F (36.9 C) (Oral)  Resp 20  SpO2 97%. The patient tolerated treatment without significant acute  effects, and was discharged to home in stable condition.    PLAN: Follow-up in one month.  ________________________________  Sheral Apley. [name], M.D.

## 2013-03-07 NOTE — Progress Notes (Signed)
Pt resting quietly in recliner, room 1. Pt reports mild mid back pain, states he took pain medication prior to [facility] treatment today. Pt denies other pain, nausea. Pt drinking water, wife to come in.  Nursing call bell at pt's side; pt verbalizes proper use of call bell.

## 2013-03-07 NOTE — Op Note (Signed)
   Name: Fred [name]  MRN: [id number]  Date: 03/07/2013   DOB: 01-Jul-1970  Stereotactic Radiosurgery Operative Note  PRE-OPERATIVE DIAGNOSIS:  Spinal Metastasis  POST-OPERATIVE DIAGNOSIS:  Spinal Metastasis  PROCEDURE:  Stereotactic Radiosurgery  SURGEON:  Peggyann Shoals, MD  NARRATIVE: The patient underwent a radiation treatment planning session in the radiation oncology simulation suite under the care of the radiation oncology physician and physicist.  I participated closely in the radiation treatment planning afterwards. The patient underwent planning CT myelogram which was fused to the MRI.  These images were fused on the planning system.  Radiation oncology contoured the gross target volume and subsequently expanded this to yield the Planning Target Volume. I actively participated in the planning process.  I helped to define and review the target contours and also the contours of the spinal cord, and selected nearby organs at risk.  All the dose constraints for critical structures were reviewed and compared to AAPM Task Group 101.  The prescription dose conformity was reviewed.  I approved the plan electronically.  We are treating a metastatic lesion at T 9.  Accordingly, Fred [name] was brought to the TrueBeam stereotactic radiation treatment linac and placed in the custom immobilization device.  The patient was aligned according to the IR fiducial markers with BrainLab Exactrac, then orthogonal x-rays were used in ExacTrac with the 6DOF robotic table and the shifts were made to align the patient.  Then conebeam CT was performed to verify precision.  Fred [name] received stereotactic radiosurgery uneventfully.  The detailed description of the procedure is recorded in the radiation oncology procedure note.  I was present for the duration of the procedure.  DISPOSITION:  Following delivery, the patient was transported to nursing in stable condition and monitored for possible acute effects to be  discharged to home in stable condition with follow-up in one month.  Peggyann Shoals, MD 03/07/2013 2:43 PM

## 2013-03-07 NOTE — Progress Notes (Signed)
1 hour s/p SRS back, vitals taken, no c/o pain, nausea, "I feel good,relaxed and comfortable', paged MD ..now

## 2013-03-07 NOTE — [facility] Oncology Navigator Note (Unsigned)
Went to [facility] [facility] [employer] [facility] for results.  I printed and placed on Dr. Worthy Flank desk

## 2013-03-11 ENCOUNTER — Other Ambulatory Visit: Payer: Self-pay | Admitting: Radiology

## 2013-03-11 ENCOUNTER — Telehealth: Payer: Self-pay | Admitting: Internal Medicine

## 2013-03-11 ENCOUNTER — Other Ambulatory Visit (HOSPITAL_BASED_OUTPATIENT_CLINIC_OR_DEPARTMENT_OTHER): Payer: BC Managed Care – PPO

## 2013-03-11 ENCOUNTER — Ambulatory Visit (HOSPITAL_BASED_OUTPATIENT_CLINIC_OR_DEPARTMENT_OTHER): Payer: BC Managed Care – PPO | Admitting: Internal Medicine

## 2013-03-11 ENCOUNTER — Telehealth: Payer: Self-pay | Admitting: *Deleted

## 2013-03-11 ENCOUNTER — Encounter: Payer: Self-pay | Admitting: Internal Medicine

## 2013-03-11 VITALS — BP 125/95 | HR 137 | Temp 98.4°F | Resp 18 | Ht 71.0 in | Wt 247.7 lb

## 2013-03-11 DIAGNOSIS — C159 Malignant neoplasm of esophagus, unspecified: Secondary | ICD-10-CM

## 2013-03-11 DIAGNOSIS — C155 Malignant neoplasm of lower third of esophagus: Secondary | ICD-10-CM

## 2013-03-11 DIAGNOSIS — C7800 Secondary malignant neoplasm of unspecified lung: Secondary | ICD-10-CM

## 2013-03-11 DIAGNOSIS — C787 Secondary malignant neoplasm of liver and intrahepatic bile duct: Secondary | ICD-10-CM

## 2013-03-11 DIAGNOSIS — C7951 Secondary malignant neoplasm of bone: Secondary | ICD-10-CM

## 2013-03-11 DIAGNOSIS — R634 Abnormal weight loss: Secondary | ICD-10-CM

## 2013-03-11 DIAGNOSIS — R630 Anorexia: Secondary | ICD-10-CM

## 2013-03-11 DIAGNOSIS — C7952 Secondary malignant neoplasm of bone marrow: Secondary | ICD-10-CM

## 2013-03-11 LAB — BASIC METABOLIC PANEL (CC13)
ANION GAP: 13 meq/L — AB (ref 3–11)
BUN: 9.5 mg/dL (ref 7.0–26.0)
CHLORIDE: 97 meq/L — AB (ref 98–109)
CO2: 27 mEq/L (ref 22–29)
CREATININE: 1 mg/dL (ref 0.7–1.3)
Calcium: 9.8 mg/dL (ref 8.4–10.4)
Glucose: 140 mg/dl (ref 70–140)
Potassium: 4.5 mEq/L (ref 3.5–5.1)
Sodium: 137 mEq/L (ref 136–145)

## 2013-03-11 LAB — CBC WITH DIFFERENTIAL/PLATELET
BASO%: 0.2 % (ref 0.0–2.0)
BASOS ABS: 0 10*3/uL (ref 0.0–0.1)
EOS%: 0.6 % (ref 0.0–7.0)
Eosinophils Absolute: 0.1 10*3/uL (ref 0.0–0.5)
HEMATOCRIT: 44.3 % (ref 38.4–49.9)
HEMOGLOBIN: 14.7 g/dL (ref 13.0–17.1)
LYMPH%: 8.3 % — ABNORMAL LOW (ref 14.0–49.0)
MCH: 29.8 pg (ref 27.2–33.4)
MCHC: 33.3 g/dL (ref 32.0–36.0)
MCV: 89.5 fL (ref 79.3–98.0)
MONO#: 1.2 10*3/uL — ABNORMAL HIGH (ref 0.1–0.9)
MONO%: 7 % (ref 0.0–14.0)
NEUT#: 14.6 10*3/uL — ABNORMAL HIGH (ref 1.5–6.5)
NEUT%: 83.9 % — AB (ref 39.0–75.0)
Platelets: 320 10*3/uL (ref 140–400)
RBC: 4.95 10*6/uL (ref 4.20–5.82)
RDW: 14.7 % — ABNORMAL HIGH (ref 11.0–14.6)
WBC: 17.4 10*3/uL — ABNORMAL HIGH (ref 4.0–10.3)
lymph#: 1.5 10*3/uL (ref 0.9–3.3)

## 2013-03-11 LAB — TECHNOLOGIST REVIEW

## 2013-03-11 MED ORDER — MORPHINE SULFATE ER 30 MG PO TBCR: 30.0000 mg | EXTENDED_RELEASE_TABLET | Freq: Two times a day (BID) | ORAL | Status: DC

## 2013-03-11 MED ORDER — PROCHLORPERAZINE MALEATE 10 MG PO TABS: 10.0000 mg | ORAL_TABLET | Freq: Four times a day (QID) | ORAL | Status: DC | PRN

## 2013-03-11 NOTE — Telephone Encounter (Signed)
Per staff message and POF I have scheduled appts.  JMW  

## 2013-03-11 NOTE — Progress Notes (Signed)
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  MOLECULAR BIOMARKERS: Foundation one: Amplification of PIK3CA, RAF1, CCND1, MYC(equivocal), PRKCl, SOX2, EPHB1, FGF19, FGF3, FGF4, GATA6, TERC and [id number], [id number].  PRIOR THERAPY:  1) status post T8-T10 spinal fusion under the care of Dr. Vertell Limber. 2) status post a stereotactic radiotherapy to the T9 lesion under the care of Dr. [name].  CURRENT THERAPY: Systemic chemotherapy with EOF, (epirubicin, oxaliplatin and 5-FU) every 3 weeks. First dose on 03/18/2013.   INTERVAL HISTORY: Fred [name] 43 y.o. male returns to the clinic today for followup visit accompanied by his wife. The patient is feeling fine today but lost almost 40 pounds since his diagnosis secondary to lack of appetite and oral thrush that was treated. He completed stereotactic radiotherapy to the T9 lesion under the care of Dr. [name]. He was seen by Dr. Michail Sermon and underwent upper endoscopy with biopsy of the distal esophageal mass that was consistent with adenocarcinoma. The patient was also seen by Dr. Burt Ek at [facility] who recommended treatment with FOLFOX, EOX or EOF. The molecular biomarker testing Foundation one showed no actionable mutations. The patient is here today for evaluation and discussion of his treatment options. He denied having any significant fever or chills, no nausea or vomiting, no abdominal pain. He continues to have occasional back pain and he is currently on Percocet with little improvement.  MEDICAL HISTORY: Past Medical History  Diagnosis Date  . 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 GERD (gastroesophageal reflux disease)     cannot taste anything  . Metastatic cancer to spine  02/14/2013    ALLERGIES:  has No Known Allergies.  MEDICATIONS:  Current Outpatient Prescriptions  Medication Sig Dispense Refill  . dexamethasone (DECADRON) 4 MG tablet Take 1 tablet (4 mg total) by mouth as directed. Take 1 pill (4 mg) daily for 5 days, then take 1/2 pill (2 mg) once daily for 5 days then stop.  45 tablet  2  . omeprazole (PRILOSEC) 20 MG capsule Take 20 mg by mouth daily.      Marland Kitchen oxyCODONE-acetaminophen (PERCOCET/ROXICET) 5-325 MG per tablet Take 1-2 tablets by mouth every 4 (four) hours as needed for moderate pain.  60 tablet  0   No current facility-administered medications for this visit.    REVIEW OF SYSTEMS:  Constitutional: negative Eyes: negative Ears, nose, mouth, throat, and face: negative Respiratory: negative Cardiovascular: negative Gastrointestinal: negative Genitourinary:negative Integument/breast: negative Hematologic/lymphatic: negative Musculoskeletal:positive for back pain Neurological: negative Behavioral/Psych: negative Endocrine: negative Allergic/Immunologic: negative   PHYSICAL EXAMINATION: General appearance: alert, cooperative, fatigued and no distress Head: Normocephalic, without obvious abnormality, atraumatic Neck: no JVD, supple, symmetrical, trachea midline, thyroid not enlarged, symmetric, no tenderness/mass/nodules and Palpable small left supraclavicular lymph node Lymph nodes: Palpable small left supraclavicular lymph node Resp: clear to auscultation bilaterally Back: symmetric, no curvature. ROM normal. No CVA tenderness. Cardio: regular rate and rhythm, S1, S2 normal, no murmur, click, rub or gallop GI: soft, non-tender; bowel sounds normal; no masses,  no organomegaly Extremities: extremities normal, atraumatic, no cyanosis or edema Neurologic: Alert and oriented X 3, normal strength and tone. Normal symmetric reflexes. Normal coordination and gait  ECOG PERFORMANCE STATUS: 1 - Symptomatic but completely  ambulatory  Blood pressure 125/95, pulse 137,  temperature 98.4 F (36.9 C), temperature source Oral, resp. rate 18, height $RemoveBe'5\' 11"'saNMRbuHT$  (1.803 m), weight 247 lb 11.2 oz (112.356 kg).  LABORATORY DATA: Lab Results  Component Value Date   WBC 17.4* 03/11/2013   HGB 14.7 03/11/2013   HCT 44.3 03/11/2013   MCV 89.5 03/11/2013   PLT 320 03/11/2013      Chemistry      Component Value Date/Time   NA 133* 02/14/2013 1505   NA 137 02/07/2013 1209   K 3.9 02/14/2013 1505   K 4.6 02/07/2013 1209   CL 95* 02/11/2013 0954   CO2 28 02/11/2013 0954   CO2 28 02/07/2013 1209   BUN 16 02/11/2013 0954   BUN 16.0 02/07/2013 1209   CREATININE 0.82 02/11/2013 0954   CREATININE 0.8 02/07/2013 1209      Component Value Date/Time   CALCIUM 9.2 02/11/2013 0954   CALCIUM 9.7 02/07/2013 1209   ALKPHOS 118* 02/11/2013 0954   ALKPHOS 138 02/07/2013 1209   AST 19 02/11/2013 0954   AST 17 02/07/2013 1209   ALT 35 02/11/2013 0954   ALT 40 02/07/2013 1209   BILITOT 1.0 02/11/2013 0954   BILITOT 0.83 02/07/2013 1209       RADIOGRAPHIC STUDIES:  ASSESSMENT AND PLAN: This is a very pleasant and unfortunate 43 years old white male who is recently diagnosed with widely metastatic distal esophageal adenocarcinoma. The patient is status post stabilization of his thoracic vertebrae with resection of tumor from that area followed by stereotactic radiotherapy. His upper endoscopy was consistent with distal esophageal adenocarcinoma with negative HER-2.  I have a lengthy discussion with the patient and his wife today about his current disease status and treatment options. I recommended for the patient treatment with either epirubicin, oxaliplatin and Xeloda (EOX) versus epirubicin, oxaliplatin and 5-FU (EOF). The patient is interested in treatment with the EOF regimen. I discussed with him the adverse effect of this treatment including but not limited to alopecia, myelosuppression, nausea and vomiting, peripheral neuropathy, liver or adrenal dysfunction. The  patient would like to proceed with treatment as planned. First cycle expected on 03/18/2013. I will arrange for him to have a Port-A-Cath placed before starting the first cycle of his treatment. I will call his pharmacy with prescription for Compazine 10 mg by mouth every 6 hours as needed for nausea in addition to EMLA cream to be applied to the Port-A-Cath site before treatment He would come back for followup visit in 4 weeks with the start of cycle #2. For pain management I started the patient on MS Contin 30 mg by mouth every 12 hours in addition to Percocet for breakthrough pain. The patient voices understanding of current disease status and treatment options and is in agreement with the current care plan.  All questions were answered. The patient knows to call the clinic with any problems, questions or concerns. We can certainly see the patient much sooner if necessary.  I spent 20 minutes counseling the patient face to face. The total time spent in the appointment was 30 minutes.  Disclaimer: This note was dictated with voice recognition software. Similar sounding words can inadvertently be transcribed and may not be corrected upon review.

## 2013-03-11 NOTE — Telephone Encounter (Signed)
gv adn printed appt sched and avs for pt for March adn April...sed adn MW added tx.

## 2013-03-12 ENCOUNTER — Other Ambulatory Visit: Payer: BC Managed Care – PPO

## 2013-03-12 ENCOUNTER — Encounter (HOSPITAL_COMMUNITY): Payer: Self-pay | Admitting: Pharmacy Technician

## 2013-03-12 ENCOUNTER — Encounter: Payer: Self-pay | Admitting: *Deleted

## 2013-03-12 ENCOUNTER — Other Ambulatory Visit: Payer: Self-pay | Admitting: Internal Medicine

## 2013-03-12 DIAGNOSIS — C159 Malignant neoplasm of esophagus, unspecified: Secondary | ICD-10-CM

## 2013-03-13 ENCOUNTER — Other Ambulatory Visit: Payer: Self-pay | Admitting: *Deleted

## 2013-03-13 ENCOUNTER — Encounter: Payer: Self-pay | Admitting: Internal Medicine

## 2013-03-13 ENCOUNTER — Telehealth: Payer: Self-pay | Admitting: *Deleted

## 2013-03-13 DIAGNOSIS — C7800 Secondary malignant neoplasm of unspecified lung: Secondary | ICD-10-CM

## 2013-03-13 MED ORDER — CAPECITABINE 500 MG PO TABS: ORAL_TABLET | ORAL | Status: DC

## 2013-03-13 MED ORDER — LIDOCAINE-PRILOCAINE 2.5-2.5 % EX CREA: 1.0000 "application " | TOPICAL_CREAM | CUTANEOUS | Status: DC | PRN

## 2013-03-13 NOTE — Telephone Encounter (Signed)
gv echo order to [facility] for pre cert...td

## 2013-03-13 NOTE — Progress Notes (Signed)
Per [facility] they are unable to fill the xeloda prescription sent to them, it has to go to [facility] @ [phone]

## 2013-03-13 NOTE — Addendum Note (Signed)
Addended by: Curt Bears on: 03/13/2013 02:07 PM   Modules accepted: Orders

## 2013-03-14 ENCOUNTER — Other Ambulatory Visit: Payer: Self-pay | Admitting: *Deleted

## 2013-03-14 ENCOUNTER — Encounter (HOSPITAL_COMMUNITY): Payer: Self-pay

## 2013-03-14 ENCOUNTER — Ambulatory Visit (HOSPITAL_COMMUNITY)
Admission: RE | Admit: 2013-03-14 | Discharge: 2013-03-14 | Disposition: A | Discharge status: Home / Self Care | Payer: BC Managed Care – PPO | Source: Ambulatory Visit | Attending: Internal Medicine | Admitting: Internal Medicine

## 2013-03-14 ENCOUNTER — Other Ambulatory Visit: Payer: Self-pay | Admitting: Internal Medicine

## 2013-03-14 ENCOUNTER — Telehealth: Payer: Self-pay | Admitting: Internal Medicine

## 2013-03-14 ENCOUNTER — Telehealth: Payer: Self-pay | Admitting: *Deleted

## 2013-03-14 DIAGNOSIS — Z79899 Other long term (current) drug therapy: Secondary | ICD-10-CM | POA: Insufficient documentation

## 2013-03-14 DIAGNOSIS — C159 Malignant neoplasm of esophagus, unspecified: Secondary | ICD-10-CM

## 2013-03-14 DIAGNOSIS — C7952 Secondary malignant neoplasm of bone marrow: Secondary | ICD-10-CM

## 2013-03-14 DIAGNOSIS — K219 Gastro-esophageal reflux disease without esophagitis: Secondary | ICD-10-CM | POA: Insufficient documentation

## 2013-03-14 DIAGNOSIS — C7951 Secondary malignant neoplasm of bone: Secondary | ICD-10-CM | POA: Insufficient documentation

## 2013-03-14 DIAGNOSIS — Z87891 Personal history of nicotine dependence: Secondary | ICD-10-CM | POA: Insufficient documentation

## 2013-03-14 LAB — APTT: aPTT: 28 seconds (ref 24–37)

## 2013-03-14 LAB — CBC
HCT: 40 % (ref 39.0–52.0)
HEMOGLOBIN: 13.4 g/dL (ref 13.0–17.0)
MCH: 29.4 pg (ref 26.0–34.0)
MCHC: 33.5 g/dL (ref 30.0–36.0)
MCV: 87.7 fL (ref 78.0–100.0)
Platelets: 203 10*3/uL (ref 150–400)
RBC: 4.56 MIL/uL (ref 4.22–5.81)
RDW: 14.5 % (ref 11.5–15.5)
WBC: 15.6 10*3/uL — ABNORMAL HIGH (ref 4.0–10.5)

## 2013-03-14 LAB — PROTIME-INR
INR: 0.94 (ref 0.00–1.49)
Prothrombin Time: 12.4 seconds (ref 11.6–15.2)

## 2013-03-14 MED ORDER — HEPARIN SOD (PORK) LOCK FLUSH 100 UNIT/ML IV SOLN
INTRAVENOUS | Status: AC
  Filled 2013-03-14: qty 5

## 2013-03-14 MED ORDER — CEFAZOLIN SODIUM-DEXTROSE 2-3 GM-% IV SOLR: 2.0000 g | INTRAVENOUS | Status: DC

## 2013-03-14 MED ORDER — FENTANYL CITRATE 0.05 MG/ML IJ SOLN
INTRAMUSCULAR | Status: AC
  Filled 2013-03-14: qty 6

## 2013-03-14 MED ORDER — MIDAZOLAM HCL 2 MG/2ML IJ SOLN
INTRAMUSCULAR | Status: AC | PRN
  Administered 2013-03-14 (×4): 1 mg via INTRAVENOUS

## 2013-03-14 MED ORDER — CEFAZOLIN SODIUM-DEXTROSE 2-3 GM-% IV SOLR
INTRAVENOUS | Status: AC
  Filled 2013-03-14: qty 50

## 2013-03-14 MED ORDER — MIDAZOLAM HCL 2 MG/2ML IJ SOLN
INTRAMUSCULAR | Status: AC
  Filled 2013-03-14: qty 6

## 2013-03-14 MED ORDER — FENTANYL CITRATE 0.05 MG/ML IJ SOLN
INTRAMUSCULAR | Status: AC | PRN
  Administered 2013-03-14: 50 ug via INTRAVENOUS

## 2013-03-14 MED ORDER — LIDOCAINE HCL 1 % IJ SOLN
INTRAMUSCULAR | Status: AC
  Filled 2013-03-14: qty 20

## 2013-03-14 MED ORDER — SODIUM CHLORIDE 0.9 % IV SOLN
INTRAVENOUS | Status: DC
  Administered 2013-03-14: 250 mL via INTRAVENOUS

## 2013-03-14 MED ORDER — HEPARIN SOD (PORK) LOCK FLUSH 100 UNIT/ML IV SOLN
INTRAVENOUS | Status: AC | PRN
  Administered 2013-03-14: 500 [IU]

## 2013-03-14 MED ORDER — CAPECITABINE 500 MG PO TABS: ORAL_TABLET | ORAL | Status: DC

## 2013-03-14 NOTE — H&P (Signed)
Fred [name] is an 43 y.o. male.   Chief Complaint: "I'm here for a port a cath" HPI: Patient with history of metastatic esophageal carcinoma presents today for port a cath placement for chemotherapy.                                                                                                                  Past Medical History  Diagnosis Date  . 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 GERD (gastroesophageal reflux disease)     cannot taste anything  . 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 No pertinent past surgical history.  History reviewed. No pertinent family history. Social History:  reports that he has never smoked. He has quit using smokeless tobacco. His smokeless tobacco use included Chew. He reports that he drinks alcohol. He reports that he does not use illicit drugs.  Allergies: No Known Allergies  Current outpatient prescriptions:morphine (MS CONTIN) 30 MG 12 hr tablet, Take 1 tablet (30 mg total) by mouth every 12 (twelve) hours., Disp: 60 tablet, Rfl: 0;  omeprazole (PRILOSEC) 20 MG capsule, Take 20 mg by mouth daily., Disp: , Rfl: ;  oxyCODONE-acetaminophen (PERCOCET/ROXICET) 5-325 MG per tablet, Take 1-2 tablets by mouth every 4 (four) hours as needed for moderate pain., Disp: 60 tablet, Rfl: 0 capecitabine (XELODA) 500 MG tablet, 3 tablets by mouth twice a day for 14 days every 3 weeks. Start with the first day of chemotherapy., Disp: 84 tablet, Rfl: 5;  lidocaine-prilocaine (EMLA) cream, Apply 1 application topically as needed. Apply to port 1 hr before chemo, Disp: 30 g, Rfl: 0;  prochlorperazine (COMPAZINE) 10 MG tablet, Take 1 tablet (10 mg total) by mouth every 6 (six) hours as needed for nausea or vomiting., Disp: 60 tablet, Rfl: 0 Current facility-administered medications:0.9 %  sodium chloride infusion, , Intravenous, Continuous, D Kevin Allred, PA-C, Last Rate: 20 mL/hr at 03/14/13 1205, 250 mL at 03/14/13 1205;   ceFAZolin (ANCEF) IVPB 2 g/50 mL premix, 2 g, Intravenous, On Call, D Rowe Robert, PA-C   Results for orders placed during the hospital encounter of 03/14/13 (from the past 48 hour(s))  APTT     Status: None   Collection Time    03/14/13 11:58 AM      Result Value Ref Range   aPTT 28  24 - 37 seconds  CBC     Status: Abnormal   Collection Time    03/14/13 11:58 AM      Result Value Ref Range   WBC 15.6 (*) 4.0 - 10.5 K/uL   RBC 4.56  4.22 - 5.81 MIL/uL   Hemoglobin 13.4  13.0 - 17.0 g/dL   HCT 40.0  39.0 - 52.0 %   MCV 87.7  78.0 - 100.0 fL   MCH 29.4  26.0 - 34.0 pg   MCHC 33.5  30.0 - 36.0 g/dL   RDW 14.5  11.5 - 15.5 %   Platelets 203  150 -  400 K/uL  PROTIME-INR     Status: None   Collection Time    03/14/13 11:58 AM      Result Value Ref Range   Prothrombin Time 12.4  11.6 - 15.2 seconds   INR 0.94  0.00 - 1.49   No results found.  Review of Systems  Constitutional: Positive for malaise/fatigue. Negative for fever and chills.  Respiratory: Negative for hemoptysis and shortness of breath.        Occ cough  Cardiovascular: Negative for chest pain.  Gastrointestinal: Negative for nausea, vomiting, abdominal pain and blood in stool.  Genitourinary: Negative for hematuria.  Musculoskeletal: Positive for back pain.  Neurological: Negative for headaches.  Endo/Heme/Allergies: Does not bruise/bleed easily.    Blood pressure 146/92, pulse 109, temperature 98.6 F (37 C), temperature source Oral, resp. rate 18, height 6' (1.829 m), weight 247 lb (112.038 kg), SpO2 99.00%. Physical Exam  Constitutional: He is oriented to person, place, and time. He appears well-developed and well-nourished.  Cardiovascular: Regular rhythm.   tachy  Respiratory: Effort normal and breath sounds normal.  GI: Soft. Bowel sounds are normal.  Musculoskeletal: Normal range of motion. He exhibits no edema.  Neurological: He is alert and oriented to person, place, and time.      Assessment/Plan Patient with history of metastatic esophageal carcinoma presents today for port a cath placement for chemotherapy. Details/risks of procedure d/w pt/wife with their understanding and consent.  ALLRED,D KEVIN 03/14/2013, 1:14 PM

## 2013-03-14 NOTE — Discharge Instructions (Signed)
Moderate Sedation, Adult Moderate sedation is given to help you relax or even sleep through a procedure. You may remain sleepy, be clumsy, or have poor balance for several hours following this procedure. Arrange for a responsible adult, family member, or friend to take you home. A responsible adult should stay with you for at least 24 hours or until the medicines have worn off.  Do not participate in any activities where you could become injured for the next 24 hours, or until you feel normal again. Do not:  Drive.  Swim.  Ride a bicycle.  Operate heavy machinery.  Cook.  Use power tools.  Climb ladders.  Work at [employer].  Do not make important decisions or sign legal documents until you are improved.  Vomiting may occur if you eat too soon. When you can drink without vomiting, try water, juice, or soup. Try solid foods if you feel little or no nausea.  Only take over-the-counter or prescription medications for pain, discomfort, or fever as directed by your caregiver.If pain medications have been prescribed for you, ask your caregiver how soon it is safe to take them.  Make sure you and your family fully understands everything about the medication given to you. Make sure you understand what side effects may occur.  You should not drink alcohol, take sleeping pills, or medications that cause drowsiness for at least 24 hours.  If you smoke, do not smoke alone.  If you are feeling better, you may resume normal activities 24 hours after receiving sedation.  Keep all appointments as scheduled. Follow all instructions.  Ask questions if you do not understand. SEEK MEDICAL CARE IF:   Your skin is pale or bluish in color.  You continue to feel sick to your stomach (nauseous) or throw up (vomit).  Your pain is getting worse and not helped by medication.  You have bleeding or swelling.  You are still sleepy or feeling clumsy after 24 hours. SEEK IMMEDIATE MEDICAL CARE IF:    You develop a rash.  You have difficulty breathing.  You develop any type of allergic problem.  You have a fever. Document Released: 09/14/2000 Document Revised: 03/14/2011 Document Reviewed: 08/27/2012 [facility] Patient Information 2014 [place]. Implanted [facility] Guide An implanted port is a type of central line that is placed under the skin. Central lines are used to provide IV access when treatment or nutrition needs to be given through a person's veins. Implanted ports are used for long-term IV access. An implanted port may be placed because:   You need IV medicine that would be irritating to the small veins in your hands or arms.   You need long-term IV medicines, such as antibiotics.   You need IV nutrition for a long period.   You need frequent blood draws for lab tests.   You need dialysis.  Implanted ports are usually placed in the chest area, but they can also be placed in the upper arm, the abdomen, or the leg. An implanted port has two main parts:   Reservoir. The reservoir is round and will appear as a small, raised area under your skin. The reservoir is the part where a needle is inserted to give medicines or draw blood.   Catheter. The catheter is a thin, flexible tube that extends from the reservoir. The catheter is placed into a large vein. Medicine that is inserted into the reservoir goes into the catheter and then into the vein.  [place]  INCISION SITE? Do not get the incision site wet. Bathe or shower as directed by your health care provider.  HOW IS MY PORT ACCESSED? Special steps must be taken to access the port:   Before the port is accessed, a numbing cream can be placed on the skin. This helps numb the skin over the port site.   Your health care provider uses a sterile technique to access the port.  Your health care provider must put on a mask and sterile gloves.  The skin over your port is cleaned carefully with an  antiseptic and allowed to dry.  The port is gently pinched between sterile gloves, and a needle is inserted into the port.  Only "non-coring" port needles should be used to access the port. Once the port is accessed, a blood return should be checked. This helps ensure that the port is in the vein and is not clogged.   If your port needs to remain accessed for a constant infusion, a clear (transparent) bandage will be placed over the needle site. The bandage and needle will need to be changed every week, or as directed by your health care provider.   Keep the bandage covering the needle clean and dry. Do not get it wet. Follow your health care provider's instructions on how to take a shower or bath while the port is accessed.   If your port does not need to stay accessed, no bandage is needed over the port.  WHAT IS FLUSHING? Flushing helps keep the port from getting clogged. Follow your health care provider's instructions on how and when to flush the port. Ports are usually flushed with saline solution or a medicine called heparin. The need for flushing will depend on how the port is used.   If the port is used for intermittent medicines or blood draws, the port will need to be flushed:   After medicines have been given.   After blood has been drawn.   As part of routine maintenance.   If a constant infusion is running, the port may not need to be flushed.  HOW LONG WILL MY PORT STAY IMPLANTED? The port can stay in for as long as your health care provider thinks it is needed. When it is time for the port to come out, surgery will be done to remove it. The procedure is similar to the one performed when the port was put in.  WHEN SHOULD I SEEK IMMEDIATE MEDICAL CARE? When you have an implanted port, you should seek immediate medical care if:   You notice a bad smell coming from the incision site.   You have swelling, redness, or drainage at the incision site.   You have more  swelling or pain at the port site or the surrounding area.   You have a fever that is not controlled with medicine. Document Released: 12/20/2004 Document Revised: 10/10/2012 Document Reviewed: 08/27/2012 [facility] Patient Information 2014 [place].

## 2013-03-14 NOTE — Procedures (Signed)
R IJ Port cathter placement with US and fluoroscopy No complication No blood loss. See complete dictation in [facility] PACS.  

## 2013-03-14 NOTE — Progress Notes (Signed)
Rx called into [facility]

## 2013-03-14 NOTE — Telephone Encounter (Signed)
s.w. pt wife per pof...advised on echo...ok and aware

## 2013-03-14 NOTE — Telephone Encounter (Signed)
Received msg from [facility] in medical mgmt that [facility] cannot fill the xeloda and needs to go through [facility].  Called and informed pt that we will be working on this rx transfer.  SLJ

## 2013-03-15 ENCOUNTER — Ambulatory Visit (HOSPITAL_COMMUNITY)
Admission: RE | Admit: 2013-03-15 | Discharge: 2013-03-15 | Disposition: A | Discharge status: Home / Self Care | Payer: BC Managed Care – PPO | Source: Ambulatory Visit | Attending: Internal Medicine | Admitting: Internal Medicine

## 2013-03-15 DIAGNOSIS — Z01818 Encounter for other preprocedural examination: Secondary | ICD-10-CM | POA: Insufficient documentation

## 2013-03-15 DIAGNOSIS — C159 Malignant neoplasm of esophagus, unspecified: Secondary | ICD-10-CM

## 2013-03-15 DIAGNOSIS — I517 Cardiomegaly: Secondary | ICD-10-CM

## 2013-03-15 NOTE — Progress Notes (Signed)
Echocardiogram 2D Echocardiogram has been performed.  Dimarco Minkin 03/15/2013, 1:08 PM

## 2013-03-16 ENCOUNTER — Telehealth: Payer: Self-pay | Admitting: Internal Medicine

## 2013-03-16 NOTE — Progress Notes (Signed)
  Radiation Oncology         [phone]) [phone] ________________________________  Name: Fred [name] MRN: [id number]  Date: 03/07/2013  DOB: Apr 26, 1970  End of Treatment Note  Diagnosis:   43 yo man with a T9 spinal metastasis from metastatic esophageal cancer.   Indication for treatment:  Palliation of pain, prevention of spinal cord injury       Radiation treatment dates:   03/07/2013  Site/dose/beams/energy:   He received stereotactic radiosurgery to the targeted metastasis in the right side of vertebral body T9 was treated using 3 Rapid Arc VMAT Beams to a prescription dose of 18 Gy.  Narrative: The patient tolerated radiation treatment relatively well.   No acute side effects occurred  Plan: The patient has completed radiation treatment. The patient will return to radiation oncology clinic for routine followup in one month. I advised him to call or return sooner if he has any questions or concerns related to his recovery or treatment. ________________________________  Sheral Apley. [name], M.D.

## 2013-03-16 NOTE — Telephone Encounter (Signed)
83 yoM with esophageal cancer s/p recent XRT to thoracic spine calls with increasing back pain. Wife called on patient's behalf and I spoke with her as covering physician. He has recently finished steroid taper from XRT and now having increasingly severe thoracic spine pain. Denies any LE weakness/numbness, bowel/bladder incontinence and pain similar to prior. Started on MS contin 30mg  BID 5 days ago and having no sedative side effects. Also taking Percocet 2 pills every 4 hours without relief. Discussed that for very severe pain that is intolerable, he would have to come to the ED. Wife states they would like to avoid ED (today is his birthday). Advised that he is taking maximum amount of Percocet (acetaminophen 650mg  q4h is >3g tylenol daily) and should not take more often. He will increase MS Contin to 30mg  q8h given pain. In addition, he just finished steroid taper and would guess that pain flare related to steroid taper. Given his severe pain, advised that he can take decadron 2mg  bid if needed over the weekend for adjuvant to his narcotics. Also advised that he ask his primary oncologist for a prescription for oxycodone for breakthrough without the acetaminophen component in case he has flares like this in the future and needs to titrate without fear of Tylenol overdose.  He will discuss with providers at his chemo appointment on Monday and I will copy Dr. Julien Nordmann on this note to consider oxycodone Rx and check on pain control.

## 2013-03-18 ENCOUNTER — Telehealth: Payer: Self-pay | Admitting: *Deleted

## 2013-03-18 ENCOUNTER — Other Ambulatory Visit (HOSPITAL_BASED_OUTPATIENT_CLINIC_OR_DEPARTMENT_OTHER): Payer: BC Managed Care – PPO

## 2013-03-18 ENCOUNTER — Other Ambulatory Visit: Payer: Self-pay | Admitting: *Deleted

## 2013-03-18 ENCOUNTER — Ambulatory Visit (HOSPITAL_BASED_OUTPATIENT_CLINIC_OR_DEPARTMENT_OTHER): Payer: BC Managed Care – PPO

## 2013-03-18 ENCOUNTER — Other Ambulatory Visit: Payer: Self-pay | Admitting: Internal Medicine

## 2013-03-18 VITALS — BP 119/78 | HR 120 | Temp 97.9°F | Resp 18

## 2013-03-18 DIAGNOSIS — C155 Malignant neoplasm of lower third of esophagus: Secondary | ICD-10-CM

## 2013-03-18 DIAGNOSIS — C7800 Secondary malignant neoplasm of unspecified lung: Secondary | ICD-10-CM

## 2013-03-18 DIAGNOSIS — C7949 Secondary malignant neoplasm of other parts of nervous system: Principal | ICD-10-CM

## 2013-03-18 DIAGNOSIS — C7951 Secondary malignant neoplasm of bone: Secondary | ICD-10-CM

## 2013-03-18 DIAGNOSIS — C159 Malignant neoplasm of esophagus, unspecified: Secondary | ICD-10-CM

## 2013-03-18 DIAGNOSIS — C7952 Secondary malignant neoplasm of bone marrow: Secondary | ICD-10-CM

## 2013-03-18 DIAGNOSIS — C7931 Secondary malignant neoplasm of brain: Secondary | ICD-10-CM

## 2013-03-18 DIAGNOSIS — C787 Secondary malignant neoplasm of liver and intrahepatic bile duct: Secondary | ICD-10-CM

## 2013-03-18 DIAGNOSIS — Z5111 Encounter for antineoplastic chemotherapy: Secondary | ICD-10-CM

## 2013-03-18 LAB — CBC WITH DIFFERENTIAL/PLATELET
BASO%: 0.3 % (ref 0.0–2.0)
Basophils Absolute: 0 10*3/uL (ref 0.0–0.1)
EOS%: 0.1 % (ref 0.0–7.0)
Eosinophils Absolute: 0 10*3/uL (ref 0.0–0.5)
HCT: 41 % (ref 38.4–49.9)
HGB: 13.5 g/dL (ref 13.0–17.1)
LYMPH%: 5 % — ABNORMAL LOW (ref 14.0–49.0)
MCH: 29.3 pg (ref 27.2–33.4)
MCHC: 32.9 g/dL (ref 32.0–36.0)
MCV: 88.9 fL (ref 79.3–98.0)
MONO#: 0.8 10*3/uL (ref 0.1–0.9)
MONO%: 5.3 % (ref 0.0–14.0)
NEUT%: 89.3 % — ABNORMAL HIGH (ref 39.0–75.0)
NEUTROS ABS: 13.4 10*3/uL — AB (ref 1.5–6.5)
NRBC: 0 % (ref 0–0)
Platelets: 213 10*3/uL (ref 140–400)
RBC: 4.61 10*6/uL (ref 4.20–5.82)
RDW: 14.7 % — ABNORMAL HIGH (ref 11.0–14.6)
WBC: 15 10*3/uL — ABNORMAL HIGH (ref 4.0–10.3)
lymph#: 0.8 10*3/uL — ABNORMAL LOW (ref 0.9–3.3)

## 2013-03-18 LAB — COMPREHENSIVE METABOLIC PANEL (CC13)
ALT: 40 U/L (ref 0–55)
ANION GAP: 12 meq/L — AB (ref 3–11)
AST: 21 U/L (ref 5–34)
Albumin: 3 g/dL — ABNORMAL LOW (ref 3.5–5.0)
Alkaline Phosphatase: 169 U/L — ABNORMAL HIGH (ref 40–150)
BILIRUBIN TOTAL: 0.34 mg/dL (ref 0.20–1.20)
BUN: 8.6 mg/dL (ref 7.0–26.0)
CO2: 25 mEq/L (ref 22–29)
CREATININE: 0.7 mg/dL (ref 0.7–1.3)
Calcium: 9.2 mg/dL (ref 8.4–10.4)
Chloride: 102 mEq/L (ref 98–109)
Glucose: 121 mg/dl (ref 70–140)
Potassium: 3.7 mEq/L (ref 3.5–5.1)
SODIUM: 139 meq/L (ref 136–145)
TOTAL PROTEIN: 6.6 g/dL (ref 6.4–8.3)

## 2013-03-18 MED ORDER — SODIUM CHLORIDE 0.9 % IV SOLN
Freq: Once | INTRAVENOUS | Status: AC
  Administered 2013-03-18: 13:00:00 via INTRAVENOUS

## 2013-03-18 MED ORDER — ONDANSETRON 16 MG/50ML IVPB (CHCC)
16.0000 mg | Freq: Once | INTRAVENOUS | Status: AC
  Administered 2013-03-18: 16 mg via INTRAVENOUS

## 2013-03-18 MED ORDER — OXYCODONE HCL 5 MG PO CAPS: 5.0000 mg | ORAL_CAPSULE | Freq: Four times a day (QID) | ORAL | Status: DC | PRN

## 2013-03-18 MED ORDER — DEXAMETHASONE SODIUM PHOSPHATE 20 MG/5ML IJ SOLN
20.0000 mg | Freq: Once | INTRAMUSCULAR | Status: AC
  Administered 2013-03-18: 20 mg via INTRAVENOUS

## 2013-03-18 MED ORDER — DEXTROSE 5 % IV SOLN
Freq: Once | INTRAVENOUS | Status: AC
  Administered 2013-03-18: 15:00:00 via INTRAVENOUS

## 2013-03-18 MED ORDER — HEPARIN SOD (PORK) LOCK FLUSH 100 UNIT/ML IV SOLN
500.0000 [IU] | Freq: Once | INTRAVENOUS | Status: AC | PRN
  Administered 2013-03-18: 500 [IU]
  Filled 2013-03-18: qty 5

## 2013-03-18 MED ORDER — ONDANSETRON 16 MG/50ML IVPB (CHCC)
INTRAVENOUS | Status: AC
  Filled 2013-03-18: qty 16

## 2013-03-18 MED ORDER — EPIRUBICIN HCL CHEMO IV INJECTION 200 MG/100ML
50.0000 mg/m2 | Freq: Once | INTRAVENOUS | Status: AC
  Administered 2013-03-18: 120 mg via INTRAVENOUS
  Filled 2013-03-18: qty 60

## 2013-03-18 MED ORDER — DEXTROSE 5 % IV SOLN
130.0000 mg/m2 | Freq: Once | INTRAVENOUS | Status: AC
  Administered 2013-03-18: 300 mg via INTRAVENOUS
  Filled 2013-03-18: qty 60

## 2013-03-18 MED ORDER — DEXAMETHASONE SODIUM PHOSPHATE 20 MG/5ML IJ SOLN
INTRAMUSCULAR | Status: AC
  Filled 2013-03-18: qty 5

## 2013-03-18 MED ORDER — SODIUM CHLORIDE 0.9 % IJ SOLN
10.0000 mL | INTRAMUSCULAR | Status: DC | PRN
  Administered 2013-03-18: 10 mL
  Filled 2013-03-18: qty 10

## 2013-03-18 NOTE — Telephone Encounter (Signed)
Called for status update from [name] [phone] at approx 11:30am today.  They stated that rx needs to go under review and will need prior authorization.  They stated they will fax over a prior auth form.  Stated this order was placed as STAT on 3/12 and we need this done ASAP.  Pt started systemic chemo today.  [facility] rep stated she would send msg as "urgent".  Still waiting for prior auth notification from [facility].  Called [name] to let her know that prior [name] has been requested.  She is going to call to work on it.  SLJ

## 2013-03-18 NOTE — Telephone Encounter (Signed)
Prior auth form given to [employer] in medical mgmt to review.  SLJ

## 2013-03-18 NOTE — Telephone Encounter (Signed)
Pt spoke with an on call MD this weekend.  They increased his morphine to TID and also recommended oxy-IR versus percocet and decadron for his pain control.  Per Dr Vista Mink, okay to give rx for oxy IR 5mg  1-2 tabs q6h PRN pain and he can take decadron if he needs it.  Pt wants to see if it is okay for him to just take 1/2 tab of decadron daily to avoid losing his taste.  Ok per Dr Vista Mink.  Pt is to call if he still does not have good pain control.  SLJ

## 2013-03-18 NOTE — Patient Instructions (Signed)
[facility] Discharge Instructions for Patients Receiving Chemotherapy  Today you received the following chemotherapy agents :  Epirubicin,  Oxaliplatin.  To help prevent nausea and vomiting after your treatment, we encourage you to take your nausea medication as instructed by your physician.  Take Compazine 10mg  by mouth every 6 hours as needed for nausea.  DO NOT Drive after taking this medication as it can cause drowsiness. DO Drink lots of fluids as tolerated.  DO NOT Eat  Greasy  Nor  Spicy  Foods. DO Take pain medications as prescribed.  Watch for constipation .  Can take  Magnesium Citrate, Miralax ,  Or  Colace.  Can also eat  Prunes, drink prune juice,  Eat  Activia  Yogurt, apple sauce for dietary fiber to help with constipation problems.    If you develop nausea and vomiting that is not controlled by your nausea medication, call the clinic.   BELOW ARE SYMPTOMS THAT SHOULD BE REPORTED IMMEDIATELY:  *FEVER GREATER THAN 100.5 F  *CHILLS WITH OR WITHOUT FEVER  NAUSEA AND VOMITING THAT IS NOT CONTROLLED WITH YOUR NAUSEA MEDICATION  *UNUSUAL SHORTNESS OF BREATH  *UNUSUAL BRUISING OR BLEEDING  TENDERNESS IN MOUTH AND THROAT WITH OR WITHOUT PRESENCE OF ULCERS  *URINARY PROBLEMS  *BOWEL PROBLEMS  UNUSUAL RASH Items with * indicate a potential emergency and should be followed up as soon as possible.  Feel free to call the clinic you have any questions or concerns. The clinic phone number is [phone].

## 2013-03-19 ENCOUNTER — Telehealth: Payer: Self-pay | Admitting: *Deleted

## 2013-03-19 NOTE — Telephone Encounter (Signed)
Spoke with pt for post chemo follow up call.  Pt stated he was doing fine.  Denied nausea/vomiting,  Good appetite and drinking lots of fluids as tolerated.  Bladder functions fine.  Pt did have good bowel movement today after taking laxatives.  Pt stated his chronic back pain is at level 3 now.  Instructed pt to call Dr. Worthy Flank nurse as soon as pt receives Xeloda.  Pt voiced understanding. Pt stated he had a very good experience at first chemo treatment yesterday.  Staff were courteous, nice, and provide explanations to pt throughout treatment.  Pt stated he was made to feel very at ease at this difficult time.  No other concerns at this time.

## 2013-03-20 ENCOUNTER — Telehealth: Payer: Self-pay | Admitting: *Deleted

## 2013-03-20 ENCOUNTER — Encounter: Payer: Self-pay | Admitting: *Deleted

## 2013-03-20 NOTE — Telephone Encounter (Signed)
Received fax from [facility] that pt's prior authorization for the xeloda has been denied.  Appeal for prior auth denial completed, along with progress notes, a letter signed by Dr Vista Mink and NCCN guidelines.  Faxed to [facility] [phone] attn: [facility] appeals dept.  Pt is aware of delay.  [name] in medical mgmt also aware.  SLJ

## 2013-03-22 ENCOUNTER — Telehealth: Payer: Self-pay | Admitting: Medical Oncology

## 2013-03-22 NOTE — Telephone Encounter (Signed)
I left message on phone that xeloda approved and to wait to hear from pharmacy about delivery date and to notify us when it is delivered.

## 2013-03-25 ENCOUNTER — Other Ambulatory Visit (HOSPITAL_BASED_OUTPATIENT_CLINIC_OR_DEPARTMENT_OTHER): Payer: BC Managed Care – PPO

## 2013-03-25 ENCOUNTER — Telehealth: Payer: Self-pay | Admitting: Medical Oncology

## 2013-03-25 ENCOUNTER — Encounter (HOSPITAL_COMMUNITY): Payer: Self-pay

## 2013-03-25 ENCOUNTER — Ambulatory Visit: Payer: BC Managed Care – PPO

## 2013-03-25 ENCOUNTER — Encounter: Payer: Self-pay | Admitting: Internal Medicine

## 2013-03-25 DIAGNOSIS — C159 Malignant neoplasm of esophagus, unspecified: Secondary | ICD-10-CM

## 2013-03-25 DIAGNOSIS — C155 Malignant neoplasm of lower third of esophagus: Secondary | ICD-10-CM

## 2013-03-25 LAB — COMPREHENSIVE METABOLIC PANEL (CC13)
ALT: 49 U/L (ref 0–55)
AST: 25 U/L (ref 5–34)
Albumin: 3.2 g/dL — ABNORMAL LOW (ref 3.5–5.0)
Alkaline Phosphatase: 158 U/L — ABNORMAL HIGH (ref 40–150)
Anion Gap: 14 mEq/L — ABNORMAL HIGH (ref 3–11)
BILIRUBIN TOTAL: 0.52 mg/dL (ref 0.20–1.20)
BUN: 10.1 mg/dL (ref 7.0–26.0)
CHLORIDE: 100 meq/L (ref 98–109)
CO2: 25 mEq/L (ref 22–29)
Calcium: 9.7 mg/dL (ref 8.4–10.4)
Creatinine: 0.7 mg/dL (ref 0.7–1.3)
Glucose: 81 mg/dl (ref 70–140)
Potassium: 3.9 mEq/L (ref 3.5–5.1)
Sodium: 139 mEq/L (ref 136–145)
Total Protein: 7.2 g/dL (ref 6.4–8.3)

## 2013-03-25 LAB — CBC WITH DIFFERENTIAL/PLATELET
BASO%: 0.7 % (ref 0.0–2.0)
BASOS ABS: 0.1 10*3/uL (ref 0.0–0.1)
EOS%: 0.9 % (ref 0.0–7.0)
Eosinophils Absolute: 0.1 10*3/uL (ref 0.0–0.5)
HEMATOCRIT: 39.5 % (ref 38.4–49.9)
HEMOGLOBIN: 13.2 g/dL (ref 13.0–17.1)
LYMPH%: 11.2 % — AB (ref 14.0–49.0)
MCH: 29.3 pg (ref 27.2–33.4)
MCHC: 33.3 g/dL (ref 32.0–36.0)
MCV: 87.9 fL (ref 79.3–98.0)
MONO#: 0.3 10*3/uL (ref 0.1–0.9)
MONO%: 3.2 % (ref 0.0–14.0)
NEUT#: 8 10*3/uL — ABNORMAL HIGH (ref 1.5–6.5)
NEUT%: 84 % — ABNORMAL HIGH (ref 39.0–75.0)
PLATELETS: 299 10*3/uL (ref 140–400)
RBC: 4.5 10*6/uL (ref 4.20–5.82)
RDW: 14.6 % (ref 11.0–14.6)
WBC: 9.5 10*3/uL (ref 4.0–10.3)
lymph#: 1.1 10*3/uL (ref 0.9–3.3)

## 2013-03-25 NOTE — Telephone Encounter (Signed)
Blood on toilet paper after he wipes. Had bout of constipation and thinks he has hemorrhoid. I recommended OTC hemorrhoidal cream and to monitor for now. I also told him to call when chemo pills arrive.

## 2013-03-26 ENCOUNTER — Other Ambulatory Visit: Payer: Self-pay

## 2013-03-26 ENCOUNTER — Telehealth: Payer: Self-pay

## 2013-03-26 NOTE — Telephone Encounter (Signed)
Fred [name] called asking if patient needs to start his prescription, they received it in the mail today and need further instructions. States his first day of chemo was last Monday and next chemo day is the 6th. I called her back and informed her that we would discuss it with Dr.Mohamed tomorrow when he returned and call her back.Patient also inquires about getting possible lab orders for May 11th Patient verbalized understanding and denies any questions or concerns at this time.

## 2013-03-27 ENCOUNTER — Telehealth: Payer: Self-pay | Admitting: Medical Oncology

## 2013-03-27 NOTE — Telephone Encounter (Signed)
Patient's spouse called to report patient has delivery of Xeloda prescription and calling to clarify when to start. Reviewed with MD, patient to start today and to take through to April 2nd, then stop for 4 days, then resume again. Wife gave verbal understanding and repeated instructions. Encouraged them to call office should they have further questions/concerns.  Sched appt 04/08/13 with lab/MD/tx.

## 2013-04-01 ENCOUNTER — Other Ambulatory Visit (HOSPITAL_BASED_OUTPATIENT_CLINIC_OR_DEPARTMENT_OTHER): Payer: BC Managed Care – PPO

## 2013-04-01 ENCOUNTER — Ambulatory Visit: Payer: BC Managed Care – PPO

## 2013-04-01 DIAGNOSIS — C159 Malignant neoplasm of esophagus, unspecified: Secondary | ICD-10-CM

## 2013-04-01 DIAGNOSIS — C155 Malignant neoplasm of lower third of esophagus: Secondary | ICD-10-CM

## 2013-04-01 LAB — CBC WITH DIFFERENTIAL/PLATELET
BASO%: 0.8 % (ref 0.0–2.0)
BASOS ABS: 0 10*3/uL (ref 0.0–0.1)
EOS%: 0.4 % (ref 0.0–7.0)
Eosinophils Absolute: 0 10*3/uL (ref 0.0–0.5)
HEMATOCRIT: 39 % (ref 38.4–49.9)
HEMOGLOBIN: 12.8 g/dL — AB (ref 13.0–17.1)
LYMPH#: 1.1 10*3/uL (ref 0.9–3.3)
LYMPH%: 22.9 % (ref 14.0–49.0)
MCH: 28.7 pg (ref 27.2–33.4)
MCHC: 32.9 g/dL (ref 32.0–36.0)
MCV: 87.1 fL (ref 79.3–98.0)
MONO#: 1.3 10*3/uL — ABNORMAL HIGH (ref 0.1–0.9)
MONO%: 27.4 % — AB (ref 0.0–14.0)
NEUT%: 48.5 % (ref 39.0–75.0)
NEUTROS ABS: 2.3 10*3/uL (ref 1.5–6.5)
Platelets: 674 10*3/uL — ABNORMAL HIGH (ref 140–400)
RBC: 4.48 10*6/uL (ref 4.20–5.82)
RDW: 14.5 % (ref 11.0–14.6)
WBC: 4.8 10*3/uL (ref 4.0–10.3)

## 2013-04-01 LAB — COMPREHENSIVE METABOLIC PANEL (CC13)
ALBUMIN: 3.3 g/dL — AB (ref 3.5–5.0)
ALT: 37 U/L (ref 0–55)
AST: 25 U/L (ref 5–34)
Alkaline Phosphatase: 160 U/L — ABNORMAL HIGH (ref 40–150)
Anion Gap: 13 mEq/L — ABNORMAL HIGH (ref 3–11)
BUN: 7 mg/dL (ref 7.0–26.0)
CALCIUM: 9.4 mg/dL (ref 8.4–10.4)
CHLORIDE: 99 meq/L (ref 98–109)
CO2: 27 mEq/L (ref 22–29)
Creatinine: 0.7 mg/dL (ref 0.7–1.3)
Glucose: 81 mg/dl (ref 70–140)
POTASSIUM: 4.3 meq/L (ref 3.5–5.1)
Sodium: 140 mEq/L (ref 136–145)
Total Bilirubin: 0.36 mg/dL (ref 0.20–1.20)
Total Protein: 7.2 g/dL (ref 6.4–8.3)

## 2013-04-01 LAB — TECHNOLOGIST REVIEW

## 2013-04-02 ENCOUNTER — Other Ambulatory Visit: Payer: Self-pay | Admitting: *Deleted

## 2013-04-02 DIAGNOSIS — C7949 Secondary malignant neoplasm of other parts of nervous system: Principal | ICD-10-CM

## 2013-04-02 DIAGNOSIS — C7931 Secondary malignant neoplasm of brain: Secondary | ICD-10-CM

## 2013-04-02 MED ORDER — MORPHINE SULFATE ER 30 MG PO TBCR: EXTENDED_RELEASE_TABLET | ORAL | Status: DC

## 2013-04-02 MED ORDER — OXYCODONE HCL 5 MG PO CAPS: 5.0000 mg | ORAL_CAPSULE | Freq: Four times a day (QID) | ORAL | Status: DC | PRN

## 2013-04-07 ENCOUNTER — Encounter: Payer: Self-pay | Admitting: Radiation Oncology

## 2013-04-07 NOTE — Progress Notes (Signed)
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  Interval Since Last Radiation:  4  Weeks  Narrative:  The patient returns today for routine follow-up with myself and Dr. Vertell Limber from neurosurgery.  The recent films were presented in our multidisciplinary conference with neuroradiology just prior to the clinic.  Denies pain at this time. Reports later in the day he experiences a back pain at fusion site. Reports taking ER and IR pain medication as directed. States, "I feel like I have turned a corner." Slow steady ambulation with cane." Blood pressure elevated related to white coat. Reports left leg stronger than right. Denies taking PT at this time. Weight stable. Denies headache, dizziness, nausea, vomiting, or diarrhea                              ALLERGIES:  has No Known Allergies.  Meds: Current Outpatient Prescriptions  Medication Sig Dispense Refill  . capecitabine (XELODA) 500 MG tablet 3 tablets by mouth twice a day for 14 days every 3 weeks. Start with the first day of chemotherapy.  84 tablet  5  . lidocaine-prilocaine (EMLA) cream Apply 1 application topically as needed. Apply to port 1 hr before chemo  30 g  0  . morphine (MS CONTIN) 30 MG 12 hr tablet Take a 30 mg tablet EVER 8 HOURS  90 tablet  0  . omeprazole (PRILOSEC) 20 MG capsule Take 20 mg by mouth daily.      Marland Kitchen oxycodone (OXY-IR) 5 MG capsule Take 1 capsule (5 mg total) by mouth every 6 (six) hours as needed for pain (1-2 tablets every 6 hours as needed).  60 capsule  0  . prochlorperazine  (COMPAZINE) 10 MG tablet Take 1 tablet (10 mg total) by mouth every 6 (six) hours as needed for nausea or vomiting.  60 tablet  0   No current facility-administered medications for this encounter.    Physical Findings: The patient is in no acute distress. Patient is alert and oriented.  weight is 247 lb 6.4 oz (112.22 kg). His blood pressure is 135/94 and his pulse is 126. Neuro exam by Dr. Vertell Limber revealed no lower extremity weakness.  No significant changes.  Lab Findings: Lab Results  Component Value Date   WBC 4.8 04/01/2013   HGB 12.8* 04/01/2013   HCT 39.0 04/01/2013   MCV 87.1 04/01/2013   PLT 674* 04/01/2013   Radiographic Findings: Ir Fluoro Guide Cv Line Right  03/14/2013   CLINICAL DATA:  Metastatic esophageal carcinoma, needs access for chemotherapy.  EXAM: TUNNELED PORT CATHETER PLACEMENT WITH ULTRASOUND AND FLUOROSCOPIC GUIDANCE  FLUOROSCOPY TIME:  30 seconds  ANESTHESIA/SEDATION: Intravenous Fentanyl and Versed were administered as conscious sedation during continuous cardiorespiratory monitoring by the radiology RN, with a total moderate sedation time of sixteen minutes.  TECHNIQUE: The procedure, risks, benefits, and alternatives were explained to the patient. Questions regarding the procedure were encouraged and answered. The patient understands and consents to the procedure. As antibiotic prophylaxis, cefazolin  2 g was ordered pre-procedure and administered intravenously within one hour of incision. Patency of the right IJ vein was confirmed with ultrasound with image documentation. An appropriate skin site was determined. Skin site was marked. Region was prepped using maximum barrier technique including cap and mask, sterile gown, sterile gloves, large sterile sheet, and Chlorhexidine as cutaneous antisepsis. The region was infiltrated locally with 1% lidocaine. Under real-time ultrasound guidance, the right IJ vein was accessed with a 21 gauge micropuncture needle; the needle tip  within the vein was confirmed with ultrasound image documentation. Needle was exchanged over a 018 guidewire for transitional dilator which allowed passage of the [facility] wire into the IVC. Over this, the transitional dilator was exchanged for a 5 [place] MPA catheter. A small incision was made on the right anterior chest wall and a subcutaneous pocket fashioned. The power-injectable port was positioned and its catheter tunneled to the right IJ dermatotomy site. The MPA catheter was exchanged over an Amplatz wire for a peel-away sheath, through which the port catheter, which had been trimmed to the appropriate length, was advanced and positioned under fluoroscopy with its tip at the cavoatrial junction. Spot chest radiograph confirms good catheter position and no pneumothorax. The pocket was closed with deep interrupted and subcuticular continuous 3-0 Monocryl sutures. The port was flushed per protocol. The incisions were covered with Dermabond then covered with a sterile dressing. No immediate complication.  IMPRESSION: Technically successful right IJ power-injectable port catheter placement. Ready for routine use.   Electronically Signed   By: Arne Cleveland M.D.   On: 03/14/2013 16:02   Ir US Guide Vasc Access Right  03/14/2013   CLINICAL DATA:  Metastatic esophageal carcinoma, needs access for chemotherapy.  EXAM: TUNNELED PORT CATHETER PLACEMENT WITH ULTRASOUND AND FLUOROSCOPIC GUIDANCE  FLUOROSCOPY TIME:  30 seconds  ANESTHESIA/SEDATION: Intravenous Fentanyl and Versed were administered as conscious sedation during continuous cardiorespiratory monitoring by the radiology RN, with a total moderate sedation time of sixteen minutes.  TECHNIQUE: The procedure, risks, benefits, and alternatives were explained to the patient. Questions regarding the procedure were encouraged and answered. The patient understands and consents to the procedure. As antibiotic prophylaxis, cefazolin 2 g was ordered pre-procedure and  administered intravenously within one hour of incision. Patency of the right IJ vein was confirmed with ultrasound with image documentation. An appropriate skin site was determined. Skin site was marked. Region was prepped using maximum barrier technique including cap and mask, sterile gown, sterile gloves, large sterile sheet, and Chlorhexidine as cutaneous antisepsis. The region was infiltrated locally with 1% lidocaine. Under real-time ultrasound guidance, the right IJ vein was accessed with a 21 gauge micropuncture needle; the needle tip within the vein was confirmed with ultrasound image documentation. Needle was exchanged over a 018 guidewire for transitional dilator which allowed passage of the [facility] wire into the IVC. Over this, the transitional dilator was exchanged for a 5 [place] MPA catheter. A small incision was made on the right anterior chest wall and a subcutaneous pocket fashioned. The power-injectable port was positioned and its catheter tunneled to the right IJ dermatotomy site. The MPA catheter was exchanged over an Amplatz wire for a peel-away sheath, through which the port catheter, which had been trimmed to the appropriate length, was advanced and positioned under fluoroscopy with its tip at the cavoatrial junction. Spot chest radiograph confirms good catheter position and no pneumothorax. The pocket was closed with deep interrupted and subcuticular continuous 3-0 Monocryl sutures. The port was flushed per  protocol. The incisions were covered with Dermabond then covered with a sterile dressing. No immediate complication.  IMPRESSION: Technically successful right IJ power-injectable port catheter placement. Ready for routine use.   Electronically Signed   By: Arne Cleveland M.D.   On: 03/14/2013 16:02   Impression:  The patient is recovering from the effects of radiation and neurologically intact.  Plan:  Review re-staging studies by Dr. Julien Nordmann with regard to the T-spine in 2 months then  followup  _____________________________________  Sheral Apley. [name], M.D. and  Erline Levine, M.D.

## 2013-04-08 ENCOUNTER — Ambulatory Visit (HOSPITAL_BASED_OUTPATIENT_CLINIC_OR_DEPARTMENT_OTHER): Payer: BC Managed Care – PPO | Admitting: Internal Medicine

## 2013-04-08 ENCOUNTER — Encounter: Payer: Self-pay | Admitting: Radiation Oncology

## 2013-04-08 ENCOUNTER — Encounter: Payer: Self-pay | Admitting: Internal Medicine

## 2013-04-08 ENCOUNTER — Other Ambulatory Visit (HOSPITAL_BASED_OUTPATIENT_CLINIC_OR_DEPARTMENT_OTHER): Payer: BC Managed Care – PPO

## 2013-04-08 ENCOUNTER — Ambulatory Visit
Admission: RE | Admit: 2013-04-08 | Discharge: 2013-04-08 | Disposition: A | Discharge status: Home / Self Care | Payer: BC Managed Care – PPO | Source: Ambulatory Visit | Attending: Radiation Oncology | Admitting: Radiation Oncology

## 2013-04-08 ENCOUNTER — Telehealth: Payer: Self-pay | Admitting: Internal Medicine

## 2013-04-08 ENCOUNTER — Ambulatory Visit (HOSPITAL_BASED_OUTPATIENT_CLINIC_OR_DEPARTMENT_OTHER): Payer: BC Managed Care – PPO

## 2013-04-08 VITALS — BP 135/94 | HR 126 | Wt 247.4 lb

## 2013-04-08 VITALS — BP 125/83 | HR 112 | Temp 97.9°F | Resp 18 | Ht 72.0 in | Wt 246.9 lb

## 2013-04-08 DIAGNOSIS — C7951 Secondary malignant neoplasm of bone: Secondary | ICD-10-CM

## 2013-04-08 DIAGNOSIS — C7800 Secondary malignant neoplasm of unspecified lung: Secondary | ICD-10-CM

## 2013-04-08 DIAGNOSIS — C787 Secondary malignant neoplasm of liver and intrahepatic bile duct: Secondary | ICD-10-CM

## 2013-04-08 DIAGNOSIS — C155 Malignant neoplasm of lower third of esophagus: Secondary | ICD-10-CM

## 2013-04-08 DIAGNOSIS — C7952 Secondary malignant neoplasm of bone marrow: Secondary | ICD-10-CM

## 2013-04-08 DIAGNOSIS — C159 Malignant neoplasm of esophagus, unspecified: Secondary | ICD-10-CM

## 2013-04-08 DIAGNOSIS — M546 Pain in thoracic spine: Secondary | ICD-10-CM

## 2013-04-08 DIAGNOSIS — Z5111 Encounter for antineoplastic chemotherapy: Secondary | ICD-10-CM

## 2013-04-08 DIAGNOSIS — C7931 Secondary malignant neoplasm of brain: Secondary | ICD-10-CM

## 2013-04-08 DIAGNOSIS — C7949 Secondary malignant neoplasm of other parts of nervous system: Principal | ICD-10-CM

## 2013-04-08 DIAGNOSIS — G893 Neoplasm related pain (acute) (chronic): Secondary | ICD-10-CM

## 2013-04-08 LAB — CBC WITH DIFFERENTIAL/PLATELET
BASO%: 0.5 % (ref 0.0–2.0)
Basophils Absolute: 0.1 10*3/uL (ref 0.0–0.1)
EOS%: 0.2 % (ref 0.0–7.0)
Eosinophils Absolute: 0 10*3/uL (ref 0.0–0.5)
HCT: 38 % — ABNORMAL LOW (ref 38.4–49.9)
HGB: 12.4 g/dL — ABNORMAL LOW (ref 13.0–17.1)
LYMPH#: 1.1 10*3/uL (ref 0.9–3.3)
LYMPH%: 7.1 % — ABNORMAL LOW (ref 14.0–49.0)
MCH: 28.6 pg (ref 27.2–33.4)
MCHC: 32.6 g/dL (ref 32.0–36.0)
MCV: 87.5 fL (ref 79.3–98.0)
MONO#: 1.5 10*3/uL — ABNORMAL HIGH (ref 0.1–0.9)
MONO%: 9.6 % (ref 0.0–14.0)
NEUT%: 82.6 % — ABNORMAL HIGH (ref 39.0–75.0)
NEUTROS ABS: 12.6 10*3/uL — AB (ref 1.5–6.5)
Platelets: 563 10*3/uL — ABNORMAL HIGH (ref 140–400)
RBC: 4.34 10*6/uL (ref 4.20–5.82)
RDW: 15.1 % — AB (ref 11.0–14.6)
WBC: 15.3 10*3/uL — AB (ref 4.0–10.3)

## 2013-04-08 LAB — COMPREHENSIVE METABOLIC PANEL (CC13)
ALBUMIN: 3.5 g/dL (ref 3.5–5.0)
ALT: 61 U/L — ABNORMAL HIGH (ref 0–55)
ANION GAP: 15 meq/L — AB (ref 3–11)
AST: 42 U/L — ABNORMAL HIGH (ref 5–34)
Alkaline Phosphatase: 187 U/L — ABNORMAL HIGH (ref 40–150)
BILIRUBIN TOTAL: 0.32 mg/dL (ref 0.20–1.20)
BUN: 6.9 mg/dL — ABNORMAL LOW (ref 7.0–26.0)
CO2: 25 meq/L (ref 22–29)
Calcium: 9.2 mg/dL (ref 8.4–10.4)
Chloride: 103 mEq/L (ref 98–109)
Creatinine: 0.8 mg/dL (ref 0.7–1.3)
Glucose: 144 mg/dl — ABNORMAL HIGH (ref 70–140)
POTASSIUM: 3.7 meq/L (ref 3.5–5.1)
SODIUM: 142 meq/L (ref 136–145)
TOTAL PROTEIN: 6.9 g/dL (ref 6.4–8.3)

## 2013-04-08 LAB — TECHNOLOGIST REVIEW

## 2013-04-08 MED ORDER — DEXTROSE 5 % IV SOLN
Freq: Once | INTRAVENOUS | Status: AC
  Administered 2013-04-08: 12:00:00 via INTRAVENOUS

## 2013-04-08 MED ORDER — ONDANSETRON 16 MG/50ML IVPB (CHCC)
16.0000 mg | Freq: Once | INTRAVENOUS | Status: AC
  Administered 2013-04-08: 16 mg via INTRAVENOUS

## 2013-04-08 MED ORDER — HEPARIN SOD (PORK) LOCK FLUSH 100 UNIT/ML IV SOLN
500.0000 [IU] | Freq: Once | INTRAVENOUS | Status: AC | PRN
  Administered 2013-04-08: 500 [IU]
  Filled 2013-04-08: qty 5

## 2013-04-08 MED ORDER — ONDANSETRON 16 MG/50ML IVPB (CHCC)
INTRAVENOUS | Status: AC
  Filled 2013-04-08: qty 16

## 2013-04-08 MED ORDER — EPIRUBICIN HCL CHEMO IV INJECTION 200 MG/100ML
51.0000 mg/m2 | Freq: Once | INTRAVENOUS | Status: AC
  Administered 2013-04-08: 120 mg via INTRAVENOUS
  Filled 2013-04-08: qty 60

## 2013-04-08 MED ORDER — SODIUM CHLORIDE 0.9 % IJ SOLN
10.0000 mL | INTRAMUSCULAR | Status: DC | PRN
  Administered 2013-04-08: 10 mL
  Filled 2013-04-08: qty 10

## 2013-04-08 MED ORDER — DEXAMETHASONE SODIUM PHOSPHATE 20 MG/5ML IJ SOLN
INTRAMUSCULAR | Status: AC
  Filled 2013-04-08: qty 5

## 2013-04-08 MED ORDER — DEXAMETHASONE SODIUM PHOSPHATE 20 MG/5ML IJ SOLN
20.0000 mg | Freq: Once | INTRAMUSCULAR | Status: AC
  Administered 2013-04-08: 20 mg via INTRAVENOUS

## 2013-04-08 MED ORDER — SODIUM CHLORIDE 0.9 % IV SOLN
Freq: Once | INTRAVENOUS | Status: AC
  Administered 2013-04-08: 12:00:00 via INTRAVENOUS

## 2013-04-08 MED ORDER — DEXTROSE 5 % IV SOLN
126.0000 mg/m2 | Freq: Once | INTRAVENOUS | Status: AC
  Administered 2013-04-08: 300 mg via INTRAVENOUS
  Filled 2013-04-08: qty 60

## 2013-04-08 NOTE — Progress Notes (Signed)
Denies pain at this time. Reports later in the day he experiences a back pain at fusion site. Reports taking ER and IR pain medication as directed. States, "I feel like I have turned a corner." Slow steady ambulation with cane." Blood pressure elevated related to white coat. Reports left leg stronger than right. Denies taking PT at this time. Weight stable. Denies headache, dizziness, nausea, vomiting, or diarrhea.

## 2013-04-08 NOTE — Patient Instructions (Signed)
[facility] Discharge Instructions for Patients Receiving Chemotherapy  Today you received the following chemotherapy agents epirubicin/oxaliplatin  To help prevent nausea and vomiting after your treatment, we encourage you to take your nausea medication as directed.    If you develop nausea and vomiting that is not controlled by your nausea medication, call the clinic.   BELOW ARE SYMPTOMS THAT SHOULD BE REPORTED IMMEDIATELY:  *FEVER GREATER THAN 100.5 F  *CHILLS WITH OR WITHOUT FEVER  NAUSEA AND VOMITING THAT IS NOT CONTROLLED WITH YOUR NAUSEA MEDICATION  *UNUSUAL SHORTNESS OF BREATH  *UNUSUAL BRUISING OR BLEEDING  TENDERNESS IN MOUTH AND THROAT WITH OR WITHOUT PRESENCE OF ULCERS  *URINARY PROBLEMS  *BOWEL PROBLEMS  UNUSUAL RASH Items with * indicate a potential emergency and should be followed up as soon as possible.  Feel free to call the clinic you have any questions or concerns. The clinic phone number is [phone].

## 2013-04-08 NOTE — Progress Notes (Signed)
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  MOLECULAR BIOMARKERS: Foundation one: Amplification of PIK3CA, RAF1, CCND1, MYC(equivocal), PRKCl, SOX2, EPHB1, FGF19, FGF3, FGF4, GATA6, TERC and [id number], [id number].  PRIOR THERAPY:  1) status post T8-T10 spinal fusion under the care of Dr. Vertell Limber. 2) status post a stereotactic radiotherapy to the T9 lesion under the care of Dr. [name].  CURRENT THERAPY: Systemic chemotherapy with EOX, (epirubicin, oxaliplatin and Xeloda) every 3 weeks. First dose on 03/18/2013.   INTERVAL HISTORY: Fred [name] 43 y.o. male returns to the clinic today for followup visit accompanied by his wife. The patient tolerated the first cycle of his chemotherapy with EOX fairly well with no significant adverse effects. He denied having any significant fever or chills. He has no nausea or vomiting. The patient denied having any significant chest pain, shortness of breath, cough or hemoptysis. He continues to have intermittent back pain but much improved compared to before. He currently works from home. He is here today to start cycle #2 of his chemotherapy.  MEDICAL HISTORY: Past Medical History  Diagnosis Date  . 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 GERD (gastroesophageal reflux disease)     cannot taste anything  . 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  MEDICATIONS:  Current Outpatient Prescriptions  Medication Sig Dispense Refill  . capecitabine (XELODA) 500 MG tablet 3 tablets by mouth twice a day for 14 days every 3 weeks. Start with the first day of chemotherapy.  84 tablet  5  . dexamethasone (DECADRON) 4 MG tablet Take 4 mg by mouth 2 (two) times daily with a  meal. Take 1/2 tablet at night      . lidocaine-prilocaine (EMLA) cream Apply 1 application topically as needed. Apply to port 1 hr before chemo  30 g  0  . morphine (MS CONTIN) 30 MG 12 hr tablet Take a 30 mg tablet EVER 8 HOURS  90 tablet  0  . omeprazole (PRILOSEC) 20 MG capsule Take 20 mg by mouth daily.      Marland Kitchen oxycodone (OXY-IR) 5 MG capsule Take 1 capsule (5 mg total) by mouth every 6 (six) hours as needed for pain (1-2 tablets every 6 hours as needed).  60 capsule  0  . prochlorperazine (COMPAZINE) 10 MG tablet Take 1 tablet (10 mg total) by mouth every 6 (six) hours as needed for nausea or vomiting.  60 tablet  0   No current facility-administered medications for this visit.    REVIEW OF SYSTEMS:  Constitutional: negative Eyes: negative Ears, nose, mouth, throat, and face: negative Respiratory: negative Cardiovascular: negative Gastrointestinal: negative Genitourinary:negative Integument/breast: negative Hematologic/lymphatic: negative Musculoskeletal:positive for back pain Neurological: negative Behavioral/Psych: negative Endocrine: negative Allergic/Immunologic: negative   PHYSICAL EXAMINATION: General appearance: alert, cooperative, fatigued and no distress Head: Normocephalic, without obvious abnormality, atraumatic Neck: no JVD, supple, symmetrical, trachea midline, thyroid not enlarged, symmetric, no tenderness/mass/nodules and Palpable small left supraclavicular lymph node Lymph nodes: Palpable small left supraclavicular lymph node Resp: clear to auscultation bilaterally Back: symmetric, no curvature. ROM normal. No CVA tenderness. Cardio: regular rate and rhythm, S1, S2 normal, no murmur, click, rub or gallop GI: soft, non-tender; bowel sounds normal; no masses,  no organomegaly Extremities:  extremities normal, atraumatic, no cyanosis or edema Neurologic: Alert and oriented X 3, normal strength and tone. Normal symmetric reflexes. Normal coordination and gait  ECOG  PERFORMANCE STATUS: 1 - Symptomatic but completely ambulatory  Blood pressure 125/83, pulse 112, temperature 97.9 F (36.6 C), temperature source Oral, resp. rate 18, height 6' (1.829 m), weight 246 lb 14.4 oz (111.993 kg).  LABORATORY DATA: Lab Results  Component Value Date   WBC 15.3* 04/08/2013   HGB 12.4* 04/08/2013   HCT 38.0* 04/08/2013   MCV 87.5 04/08/2013   PLT 563* 04/08/2013      Chemistry      Component Value Date/Time   NA 142 04/08/2013 0929   NA 133* 02/14/2013 1505   K 3.7 04/08/2013 0929   K 3.9 02/14/2013 1505   CL 95* 02/11/2013 0954   CO2 25 04/08/2013 0929   CO2 28 02/11/2013 0954   BUN 6.9* 04/08/2013 0929   BUN 16 02/11/2013 0954   CREATININE 0.8 04/08/2013 0929   CREATININE 0.82 02/11/2013 0954      Component Value Date/Time   CALCIUM 9.2 04/08/2013 0929   CALCIUM 9.2 02/11/2013 0954   ALKPHOS 187* 04/08/2013 0929   ALKPHOS 118* 02/11/2013 0954   AST 42* 04/08/2013 0929   AST 19 02/11/2013 0954   ALT 61* 04/08/2013 0929   ALT 35 02/11/2013 0954   BILITOT 0.32 04/08/2013 0929   BILITOT 1.0 02/11/2013 0954       RADIOGRAPHIC STUDIES:  ASSESSMENT AND PLAN: This is a very pleasant and unfortunate 43 years old white male who is recently diagnosed with widely metastatic distal esophageal adenocarcinoma. The patient is status post stabilization of his thoracic vertebrae with resection of tumor from that area followed by stereotactic radiotherapy. The patient is currently on systemic chemotherapy with EOX and tolerating it fairly well. Will proceed with cycle #2 today as scheduled. For pain management I started the patient on MS Contin 30 mg by mouth every 12 hours in addition to Percocet for breakthrough pain. He would come back for followup visit in 3 weeks with the start of cycle #3. He was advised to call immediately if he has any concerning symptoms in the interval. The patient voices understanding of current disease status and treatment options and is in agreement with the current care  plan.  All questions were answered. The patient knows to call the clinic with any problems, questions or concerns. We can certainly see the patient much sooner if necessary.  Disclaimer: This note was dictated with voice recognition software. Similar sounding words can inadvertently be transcribed and may not be corrected upon review.

## 2013-04-08 NOTE — Telephone Encounter (Signed)
gv and printed appt sched and avs for pt for April adn May....sed added tx.Marland KitchenMarland Kitchen

## 2013-04-15 ENCOUNTER — Ambulatory Visit: Payer: BC Managed Care – PPO

## 2013-04-15 ENCOUNTER — Other Ambulatory Visit (HOSPITAL_BASED_OUTPATIENT_CLINIC_OR_DEPARTMENT_OTHER): Payer: BC Managed Care – PPO

## 2013-04-15 DIAGNOSIS — C7800 Secondary malignant neoplasm of unspecified lung: Secondary | ICD-10-CM

## 2013-04-15 DIAGNOSIS — C159 Malignant neoplasm of esophagus, unspecified: Secondary | ICD-10-CM

## 2013-04-15 DIAGNOSIS — C155 Malignant neoplasm of lower third of esophagus: Secondary | ICD-10-CM

## 2013-04-15 DIAGNOSIS — C7952 Secondary malignant neoplasm of bone marrow: Secondary | ICD-10-CM

## 2013-04-15 DIAGNOSIS — C7951 Secondary malignant neoplasm of bone: Secondary | ICD-10-CM

## 2013-04-15 DIAGNOSIS — C787 Secondary malignant neoplasm of liver and intrahepatic bile duct: Secondary | ICD-10-CM

## 2013-04-15 LAB — CBC WITH DIFFERENTIAL/PLATELET
BASO%: 0.2 % (ref 0.0–2.0)
Basophils Absolute: 0 10*3/uL (ref 0.0–0.1)
EOS ABS: 0.3 10*3/uL (ref 0.0–0.5)
EOS%: 2.3 % (ref 0.0–7.0)
HCT: 38.8 % (ref 38.4–49.9)
HGB: 13 g/dL (ref 13.0–17.1)
LYMPH%: 7.4 % — AB (ref 14.0–49.0)
MCH: 29 pg (ref 27.2–33.4)
MCHC: 33.5 g/dL (ref 32.0–36.0)
MCV: 86.6 fL (ref 79.3–98.0)
MONO#: 0.2 10*3/uL (ref 0.1–0.9)
MONO%: 1.7 % (ref 0.0–14.0)
NEUT%: 88.4 % — ABNORMAL HIGH (ref 39.0–75.0)
NEUTROS ABS: 10.9 10*3/uL — AB (ref 1.5–6.5)
Platelets: 269 10*3/uL (ref 140–400)
RBC: 4.48 10*6/uL (ref 4.20–5.82)
RDW: 14.8 % — AB (ref 11.0–14.6)
WBC: 12.4 10*3/uL — AB (ref 4.0–10.3)
lymph#: 0.9 10*3/uL (ref 0.9–3.3)

## 2013-04-15 LAB — COMPREHENSIVE METABOLIC PANEL (CC13)
ALK PHOS: 177 U/L — AB (ref 40–150)
ALT: 80 U/L — AB (ref 0–55)
ANION GAP: 10 meq/L (ref 3–11)
AST: 53 U/L — ABNORMAL HIGH (ref 5–34)
Albumin: 3.4 g/dL — ABNORMAL LOW (ref 3.5–5.0)
BILIRUBIN TOTAL: 0.56 mg/dL (ref 0.20–1.20)
BUN: 7.3 mg/dL (ref 7.0–26.0)
CO2: 28 meq/L (ref 22–29)
Calcium: 9.5 mg/dL (ref 8.4–10.4)
Chloride: 102 mEq/L (ref 98–109)
Creatinine: 0.7 mg/dL (ref 0.7–1.3)
GLUCOSE: 103 mg/dL (ref 70–140)
POTASSIUM: 3.9 meq/L (ref 3.5–5.1)
SODIUM: 140 meq/L (ref 136–145)
Total Protein: 7.1 g/dL (ref 6.4–8.3)

## 2013-04-22 ENCOUNTER — Other Ambulatory Visit (HOSPITAL_BASED_OUTPATIENT_CLINIC_OR_DEPARTMENT_OTHER): Payer: BC Managed Care – PPO

## 2013-04-22 ENCOUNTER — Ambulatory Visit: Payer: BC Managed Care – PPO

## 2013-04-22 DIAGNOSIS — C7951 Secondary malignant neoplasm of bone: Secondary | ICD-10-CM

## 2013-04-22 DIAGNOSIS — C7952 Secondary malignant neoplasm of bone marrow: Secondary | ICD-10-CM

## 2013-04-22 DIAGNOSIS — C7800 Secondary malignant neoplasm of unspecified lung: Secondary | ICD-10-CM

## 2013-04-22 DIAGNOSIS — C159 Malignant neoplasm of esophagus, unspecified: Secondary | ICD-10-CM

## 2013-04-22 DIAGNOSIS — C787 Secondary malignant neoplasm of liver and intrahepatic bile duct: Secondary | ICD-10-CM

## 2013-04-22 DIAGNOSIS — C155 Malignant neoplasm of lower third of esophagus: Secondary | ICD-10-CM

## 2013-04-22 LAB — COMPREHENSIVE METABOLIC PANEL (CC13)
ALK PHOS: 136 U/L (ref 40–150)
ALT: 76 U/L — AB (ref 0–55)
ANION GAP: 10 meq/L (ref 3–11)
AST: 70 U/L — AB (ref 5–34)
Albumin: 3.5 g/dL (ref 3.5–5.0)
BILIRUBIN TOTAL: 0.59 mg/dL (ref 0.20–1.20)
BUN: 4.7 mg/dL — ABNORMAL LOW (ref 7.0–26.0)
CO2: 26 mEq/L (ref 22–29)
CREATININE: 0.7 mg/dL (ref 0.7–1.3)
Calcium: 9.8 mg/dL (ref 8.4–10.4)
Chloride: 104 mEq/L (ref 98–109)
Glucose: 118 mg/dl (ref 70–140)
Potassium: 4.5 mEq/L (ref 3.5–5.1)
Sodium: 140 mEq/L (ref 136–145)
TOTAL PROTEIN: 6.9 g/dL (ref 6.4–8.3)

## 2013-04-22 LAB — CBC WITH DIFFERENTIAL/PLATELET
BASO%: 0.4 % (ref 0.0–2.0)
Basophils Absolute: 0 10*3/uL (ref 0.0–0.1)
EOS%: 5.5 % (ref 0.0–7.0)
Eosinophils Absolute: 0.3 10*3/uL (ref 0.0–0.5)
HEMATOCRIT: 37.8 % — AB (ref 38.4–49.9)
HGB: 12.6 g/dL — ABNORMAL LOW (ref 13.0–17.1)
LYMPH%: 22.3 % (ref 14.0–49.0)
MCH: 28.9 pg (ref 27.2–33.4)
MCHC: 33.3 g/dL (ref 32.0–36.0)
MCV: 86.7 fL (ref 79.3–98.0)
MONO#: 0.7 10*3/uL (ref 0.1–0.9)
MONO%: 14.1 % — AB (ref 0.0–14.0)
NEUT#: 2.8 10*3/uL (ref 1.5–6.5)
NEUT%: 57.7 % (ref 39.0–75.0)
PLATELETS: 251 10*3/uL (ref 140–400)
RBC: 4.36 10*6/uL (ref 4.20–5.82)
RDW: 16 % — ABNORMAL HIGH (ref 11.0–14.6)
WBC: 4.9 10*3/uL (ref 4.0–10.3)
lymph#: 1.1 10*3/uL (ref 0.9–3.3)

## 2013-04-29 ENCOUNTER — Ambulatory Visit (HOSPITAL_BASED_OUTPATIENT_CLINIC_OR_DEPARTMENT_OTHER): Payer: BC Managed Care – PPO | Admitting: Internal Medicine

## 2013-04-29 ENCOUNTER — Telehealth: Payer: Self-pay | Admitting: Internal Medicine

## 2013-04-29 ENCOUNTER — Other Ambulatory Visit: Payer: BC Managed Care – PPO

## 2013-04-29 ENCOUNTER — Ambulatory Visit (HOSPITAL_BASED_OUTPATIENT_CLINIC_OR_DEPARTMENT_OTHER): Payer: BC Managed Care – PPO

## 2013-04-29 ENCOUNTER — Telehealth: Payer: Self-pay | Admitting: Medical Oncology

## 2013-04-29 ENCOUNTER — Encounter: Payer: Self-pay | Admitting: Internal Medicine

## 2013-04-29 ENCOUNTER — Telehealth: Payer: Self-pay | Admitting: *Deleted

## 2013-04-29 ENCOUNTER — Ambulatory Visit: Payer: BC Managed Care – PPO

## 2013-04-29 VITALS — BP 100/88 | HR 116 | Temp 97.7°F | Resp 18 | Ht 72.0 in | Wt 234.0 lb

## 2013-04-29 DIAGNOSIS — C7952 Secondary malignant neoplasm of bone marrow: Secondary | ICD-10-CM

## 2013-04-29 DIAGNOSIS — Z95828 Presence of other vascular implants and grafts: Secondary | ICD-10-CM

## 2013-04-29 DIAGNOSIS — R197 Diarrhea, unspecified: Secondary | ICD-10-CM

## 2013-04-29 DIAGNOSIS — C7951 Secondary malignant neoplasm of bone: Secondary | ICD-10-CM

## 2013-04-29 DIAGNOSIS — C155 Malignant neoplasm of lower third of esophagus: Secondary | ICD-10-CM

## 2013-04-29 DIAGNOSIS — C159 Malignant neoplasm of esophagus, unspecified: Secondary | ICD-10-CM

## 2013-04-29 DIAGNOSIS — C800 Disseminated malignant neoplasm, unspecified: Secondary | ICD-10-CM

## 2013-04-29 LAB — COMPREHENSIVE METABOLIC PANEL (CC13)
ALT: 60 U/L — ABNORMAL HIGH (ref 0–55)
AST: 58 U/L — AB (ref 5–34)
Albumin: 3.7 g/dL (ref 3.5–5.0)
Alkaline Phosphatase: 143 U/L (ref 40–150)
Anion Gap: 16 mEq/L — ABNORMAL HIGH (ref 3–11)
BILIRUBIN TOTAL: 0.69 mg/dL (ref 0.20–1.20)
BUN: 4.3 mg/dL — AB (ref 7.0–26.0)
CALCIUM: 10 mg/dL (ref 8.4–10.4)
CHLORIDE: 104 meq/L (ref 98–109)
CO2: 23 mEq/L (ref 22–29)
CREATININE: 0.8 mg/dL (ref 0.7–1.3)
Glucose: 139 mg/dl (ref 70–140)
Potassium: 4 mEq/L (ref 3.5–5.1)
Sodium: 142 mEq/L (ref 136–145)
Total Protein: 7.2 g/dL (ref 6.4–8.3)

## 2013-04-29 LAB — CBC WITH DIFFERENTIAL/PLATELET
BASO%: 0.9 % (ref 0.0–2.0)
BASOS ABS: 0.1 10*3/uL (ref 0.0–0.1)
EOS%: 0.6 % (ref 0.0–7.0)
Eosinophils Absolute: 0 10*3/uL (ref 0.0–0.5)
HEMATOCRIT: 42.7 % (ref 38.4–49.9)
HEMOGLOBIN: 14.2 g/dL (ref 13.0–17.1)
LYMPH%: 16.4 % (ref 14.0–49.0)
MCH: 29.1 pg (ref 27.2–33.4)
MCHC: 33.4 g/dL (ref 32.0–36.0)
MCV: 87.3 fL (ref 79.3–98.0)
MONO#: 1.3 10*3/uL — ABNORMAL HIGH (ref 0.1–0.9)
MONO%: 17.3 % — ABNORMAL HIGH (ref 0.0–14.0)
NEUT#: 4.9 10*3/uL (ref 1.5–6.5)
NEUT%: 64.8 % (ref 39.0–75.0)
PLATELETS: 562 10*3/uL — AB (ref 140–400)
RBC: 4.89 10*6/uL (ref 4.20–5.82)
RDW: 17.9 % — ABNORMAL HIGH (ref 11.0–14.6)
WBC: 7.5 10*3/uL (ref 4.0–10.3)
lymph#: 1.2 10*3/uL (ref 0.9–3.3)

## 2013-04-29 MED ORDER — MIRTAZAPINE 30 MG PO TABS: 30.0000 mg | ORAL_TABLET | Freq: Every day | ORAL | Status: DC

## 2013-04-29 MED ORDER — GABAPENTIN 100 MG PO CAPS: 100.0000 mg | ORAL_CAPSULE | Freq: Three times a day (TID) | ORAL | Status: AC

## 2013-04-29 MED ORDER — SODIUM CHLORIDE 0.9 % IJ SOLN
10.0000 mL | INTRAMUSCULAR | Status: DC | PRN
  Administered 2013-04-29: 10 mL via INTRAVENOUS
  Filled 2013-04-29: qty 10

## 2013-04-29 NOTE — Telephone Encounter (Signed)
New lower back pain this am . 5/10 now .has not taken pain med because he "did not eat anything" today. He is coming in today and will discuss pain with dr Julien Nordmann.

## 2013-04-29 NOTE — Progress Notes (Signed)
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  MOLECULAR BIOMARKERS: Foundation one: Amplification of PIK3CA, RAF1, CCND1, MYC(equivocal), PRKCl, SOX2, EPHB1, FGF19, FGF3, FGF4, GATA6, TERC and [id number], [id number].  PRIOR THERAPY:  1) status post T8-T10 spinal fusion under the care of Dr. Vertell Limber. 2) status post a stereotactic radiotherapy to the T9 lesion under the care of Dr. [name].  CURRENT THERAPY: Systemic chemotherapy with EOX, (epirubicin, oxaliplatin and Xeloda) every 3 weeks. First dose on 03/18/2013.   INTERVAL HISTORY: EDAHI Fred [name] 43 y.o. male returns to the clinic today for followup visit accompanied by his wife. The patient tolerated the last cycle of his chemotherapy with EOX fairly well with no significant adverse effects except for increasing fatigue for a few days. The patient is also a little bit more depressed. He denied having any significant fever or chills. He has no nausea or vomiting. He has several episodes of diarrhea started last night. He also has one or 2 episodes of sharp low back pain this morning. He also continues to complain of persistent numbness and tingling in his lower extremities bilaterally with weakness but no significant incontinence to either within or bowel movement. The patient denied having any significant chest pain, shortness of breath, cough or hemoptysis. He is here today to start cycle #3 of his chemotherapy.  MEDICAL HISTORY: Past Medical History  Diagnosis Date  . 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 GERD (gastroesophageal reflux disease)     cannot taste anything  . 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  MEDICATIONS:  Current  Outpatient Prescriptions  Medication Sig Dispense Refill  . capecitabine (XELODA) 500 MG tablet 3 tablets by mouth twice a day for 14 days every 3 weeks. Start with the first day of chemotherapy.  84 tablet  5  . lidocaine-prilocaine (EMLA) cream Apply 1 application topically as needed. Apply to port 1 hr before chemo  30 g  0  . morphine (MS CONTIN) 30 MG 12 hr tablet Take a 30 mg tablet EVER 8 HOURS  90 tablet  0  . omeprazole (PRILOSEC) 20 MG capsule Take 20 mg by mouth daily.      Marland Kitchen oxycodone (OXY-IR) 5 MG capsule Take 1 capsule (5 mg total) by mouth every 6 (six) hours as needed for pain (1-2 tablets every 6 hours as needed).  60 capsule  0  . prochlorperazine (COMPAZINE) 10 MG tablet Take 1 tablet (10 mg total) by mouth every 6 (six) hours as needed for nausea or vomiting.  60 tablet  0   No current facility-administered medications for this visit.   Facility-Administered Medications Ordered in Other Visits  Medication Dose Route Frequency Provider Last Rate Last Dose  . sodium chloride 0.9 % injection 10 mL  10 mL Intravenous PRN Curt Bears, MD   10 mL at 04/29/13 1105    REVIEW OF SYSTEMS:  Constitutional: negative Eyes: negative Ears, nose, mouth, throat, and face: negative Respiratory: negative Cardiovascular: negative Gastrointestinal: negative Genitourinary:negative Integument/breast: negative Hematologic/lymphatic: negative Musculoskeletal:positive for back pain Neurological: negative Behavioral/Psych: negative Endocrine: negative Allergic/Immunologic: negative   PHYSICAL EXAMINATION: General appearance: alert, cooperative, fatigued and no distress Head: Normocephalic, without obvious abnormality, atraumatic Neck: no JVD, supple, symmetrical,  trachea midline, thyroid not enlarged, symmetric, no tenderness/mass/nodules and Palpable small left supraclavicular lymph node Lymph nodes: Palpable small left supraclavicular lymph node Resp: clear to auscultation  bilaterally Back: symmetric, no curvature. ROM normal. No CVA tenderness. Cardio: regular rate and rhythm, S1, S2 normal, no murmur, click, rub or gallop GI: soft, non-tender; bowel sounds normal; no masses,  no organomegaly Extremities: extremities normal, atraumatic, no cyanosis or edema Neurologic: Alert and oriented X 3, normal strength and tone. Normal symmetric reflexes. Normal coordination and gait  ECOG PERFORMANCE STATUS: 1 - Symptomatic but completely ambulatory  Blood pressure 100/88, pulse 116, temperature 97.7 F (36.5 C), temperature source Oral, resp. rate 18, height 6' (1.829 m), weight 234 lb (106.142 kg), SpO2 97.00%.  LABORATORY DATA: Lab Results  Component Value Date   WBC 7.5 04/29/2013   HGB 14.2 04/29/2013   HCT 42.7 04/29/2013   MCV 87.3 04/29/2013   PLT 562* 04/29/2013      Chemistry      Component Value Date/Time   NA 140 04/22/2013 1303   NA 133* 02/14/2013 1505   K 4.5 04/22/2013 1303   K 3.9 02/14/2013 1505   CL 95* 02/11/2013 0954   CO2 26 04/22/2013 1303   CO2 28 02/11/2013 0954   BUN 4.7* 04/22/2013 1303   BUN 16 02/11/2013 0954   CREATININE 0.7 04/22/2013 1303   CREATININE 0.82 02/11/2013 0954      Component Value Date/Time   CALCIUM 9.8 04/22/2013 1303   CALCIUM 9.2 02/11/2013 0954   ALKPHOS 136 04/22/2013 1303   ALKPHOS 118* 02/11/2013 0954   AST 70* 04/22/2013 1303   AST 19 02/11/2013 0954   ALT 76* 04/22/2013 1303   ALT 35 02/11/2013 0954   BILITOT 0.59 04/22/2013 1303   BILITOT 1.0 02/11/2013 0954       RADIOGRAPHIC STUDIES:  ASSESSMENT AND PLAN: This is a very pleasant and unfortunate 43 years old white male who is recently diagnosed with widely metastatic distal esophageal adenocarcinoma. The patient is status post stabilization of his thoracic vertebrae with resection of tumor from that area followed by stereotactic radiotherapy. The patient is currently on systemic chemotherapy with EOX status post 2 cycles and tolerating it fairly well. He has several  episodes of diarrhea today. I will delay the start of cycle #3 by few days until he recovered from the diarrhea.  I advise him to take Imodium starting with 2 mg after the first stool followed by 1 mg after every episodes of diarrhea for a maximum of 16 mg daily. For pain management I started the patient on MS Contin 30 mg by mouth every 12 hours in addition to Percocet for breakthrough pain. He was advised to call immediately if he continues to have any further episodes of the sharp pain in his lower back. I would also start the patient on Neurontin 100 mg by mouth 3 times a day for the peripheral neuropathy. I will also arrange for the patient to have physical therapy at home to help with his weakness and peripheral neuropathy. For depression, I will start the patient on Remeron 30 mg by mouth each bedtime. He would come back for followup visit in 3 weeks with the start of cycle #4 after repeating CT scan of the chest, abdomen and pelvis for restaging of his disease.  He was advised to call immediately if he has any concerning symptoms in the interval. The patient voices understanding of current disease status and treatment options and is in  agreement with the current care plan.  All questions were answered. The patient knows to call the clinic with any problems, questions or concerns. We can certainly see the patient much sooner if necessary.  Disclaimer: This note was dictated with voice recognition software. Similar sounding words can inadvertently be transcribed and may not be corrected upon review.

## 2013-04-29 NOTE — Telephone Encounter (Signed)
gv pt appt schedule for april thru june. per 4/27 pof tx appts moved to wednesdays. wkly labs moved also except 5/11 due to pt out of town 5/13.

## 2013-04-29 NOTE — Telephone Encounter (Signed)
Per staff message and POF I have scheduled appts.  JMW  

## 2013-04-29 NOTE — Patient Instructions (Signed)

## 2013-04-29 NOTE — Telephone Encounter (Signed)
add to previous note. per pt message sent to [facility] to confirm if imaging study should be pet and not ct. pt thought he was having a pet scan.

## 2013-04-30 ENCOUNTER — Telehealth: Payer: Self-pay | Admitting: Medical Oncology

## 2013-04-30 NOTE — Telephone Encounter (Signed)
Asking if pt was referred to Gi recently. I told her no.

## 2013-04-30 NOTE — Telephone Encounter (Signed)
Faxed referral for home PT

## 2013-05-01 ENCOUNTER — Ambulatory Visit (HOSPITAL_BASED_OUTPATIENT_CLINIC_OR_DEPARTMENT_OTHER): Payer: BC Managed Care – PPO

## 2013-05-01 ENCOUNTER — Telehealth: Payer: Self-pay | Admitting: Internal Medicine

## 2013-05-01 VITALS — BP 121/96 | HR 135 | Temp 98.3°F

## 2013-05-01 DIAGNOSIS — C7951 Secondary malignant neoplasm of bone: Secondary | ICD-10-CM

## 2013-05-01 DIAGNOSIS — C7952 Secondary malignant neoplasm of bone marrow: Secondary | ICD-10-CM

## 2013-05-01 DIAGNOSIS — C7800 Secondary malignant neoplasm of unspecified lung: Secondary | ICD-10-CM

## 2013-05-01 DIAGNOSIS — C155 Malignant neoplasm of lower third of esophagus: Secondary | ICD-10-CM

## 2013-05-01 DIAGNOSIS — C787 Secondary malignant neoplasm of liver and intrahepatic bile duct: Secondary | ICD-10-CM

## 2013-05-01 DIAGNOSIS — Z5111 Encounter for antineoplastic chemotherapy: Secondary | ICD-10-CM

## 2013-05-01 DIAGNOSIS — C159 Malignant neoplasm of esophagus, unspecified: Secondary | ICD-10-CM

## 2013-05-01 MED ORDER — SODIUM CHLORIDE 0.9 % IJ SOLN
10.0000 mL | INTRAMUSCULAR | Status: DC | PRN
  Administered 2013-05-01: 10 mL
  Filled 2013-05-01: qty 10

## 2013-05-01 MED ORDER — DEXAMETHASONE SODIUM PHOSPHATE 20 MG/5ML IJ SOLN
INTRAMUSCULAR | Status: AC
  Filled 2013-05-01: qty 5

## 2013-05-01 MED ORDER — EPIRUBICIN HCL CHEMO IV INJECTION 200 MG/100ML
50.0000 mg/m2 | Freq: Once | INTRAVENOUS | Status: AC
  Administered 2013-05-01: 118 mg via INTRAVENOUS
  Filled 2013-05-01: qty 59

## 2013-05-01 MED ORDER — ONDANSETRON 16 MG/50ML IVPB (CHCC)
16.0000 mg | Freq: Once | INTRAVENOUS | Status: AC
  Administered 2013-05-01: 16 mg via INTRAVENOUS

## 2013-05-01 MED ORDER — DEXAMETHASONE SODIUM PHOSPHATE 20 MG/5ML IJ SOLN
20.0000 mg | Freq: Once | INTRAMUSCULAR | Status: AC
  Administered 2013-05-01: 20 mg via INTRAVENOUS

## 2013-05-01 MED ORDER — DEXTROSE 5 % IV SOLN
Freq: Once | INTRAVENOUS | Status: AC
  Administered 2013-05-01: 14:00:00 via INTRAVENOUS

## 2013-05-01 MED ORDER — OXALIPLATIN CHEMO INJECTION 100 MG/20ML
300.0000 mg | Freq: Once | INTRAVENOUS | Status: AC
  Administered 2013-05-01: 300 mg via INTRAVENOUS
  Filled 2013-05-01: qty 60

## 2013-05-01 MED ORDER — HEPARIN SOD (PORK) LOCK FLUSH 100 UNIT/ML IV SOLN
500.0000 [IU] | Freq: Once | INTRAVENOUS | Status: AC | PRN
  Administered 2013-05-01: 500 [IU]
  Filled 2013-05-01: qty 5

## 2013-05-01 MED ORDER — SODIUM CHLORIDE 0.9 % IV SOLN: Freq: Once | INTRAVENOUS | Status: DC

## 2013-05-01 MED ORDER — ONDANSETRON 16 MG/50ML IVPB (CHCC)
INTRAVENOUS | Status: AC
  Filled 2013-05-01: qty 16

## 2013-05-01 NOTE — Patient Instructions (Signed)
[facility] Discharge Instructions for Patients Receiving Chemotherapy  Today you received the following chemotherapy agents oxaliplatin/epirubicin.   To help prevent nausea and vomiting after your treatment, we encourage you to take your nausea medication as directed   If you develop nausea and vomiting that is not controlled by your nausea medication, call the clinic.   BELOW ARE SYMPTOMS THAT SHOULD BE REPORTED IMMEDIATELY:  *FEVER GREATER THAN 100.5 F  *CHILLS WITH OR WITHOUT FEVER  NAUSEA AND VOMITING THAT IS NOT CONTROLLED WITH YOUR NAUSEA MEDICATION  *UNUSUAL SHORTNESS OF BREATH  *UNUSUAL BRUISING OR BLEEDING  TENDERNESS IN MOUTH AND THROAT WITH OR WITHOUT PRESENCE OF ULCERS  *URINARY PROBLEMS  *BOWEL PROBLEMS  UNUSUAL RASH Items with * indicate a potential emergency and should be followed up as soon as possible.  Feel free to call the clinic you have any questions or concerns. The clinic phone number is [phone].

## 2013-05-01 NOTE — Telephone Encounter (Signed)
per staff message reply for [facility] imaging studies should be ct - pt informed.

## 2013-05-02 ENCOUNTER — Encounter: Payer: Self-pay | Admitting: Internal Medicine

## 2013-05-02 ENCOUNTER — Encounter: Payer: Self-pay | Admitting: *Deleted

## 2013-05-02 NOTE — Progress Notes (Signed)
Put wife's fmla form on nurse's desk.

## 2013-05-02 NOTE — [facility] Oncology Navigator Note (Unsigned)
Received fax from pt wife of FMLA papers.  Gave to [place] to complete

## 2013-05-03 ENCOUNTER — Encounter: Payer: Self-pay | Admitting: Internal Medicine

## 2013-05-03 NOTE — Progress Notes (Signed)
Faxed daughter's fmla form to [employer] @ [phone]

## 2013-05-06 ENCOUNTER — Other Ambulatory Visit: Payer: BC Managed Care – PPO

## 2013-05-08 ENCOUNTER — Telehealth: Payer: Self-pay | Admitting: *Deleted

## 2013-05-08 ENCOUNTER — Other Ambulatory Visit (HOSPITAL_BASED_OUTPATIENT_CLINIC_OR_DEPARTMENT_OTHER): Payer: BC Managed Care – PPO

## 2013-05-08 DIAGNOSIS — C7952 Secondary malignant neoplasm of bone marrow: Secondary | ICD-10-CM

## 2013-05-08 DIAGNOSIS — C787 Secondary malignant neoplasm of liver and intrahepatic bile duct: Secondary | ICD-10-CM

## 2013-05-08 DIAGNOSIS — C155 Malignant neoplasm of lower third of esophagus: Secondary | ICD-10-CM

## 2013-05-08 DIAGNOSIS — C159 Malignant neoplasm of esophagus, unspecified: Secondary | ICD-10-CM

## 2013-05-08 DIAGNOSIS — C7951 Secondary malignant neoplasm of bone: Secondary | ICD-10-CM

## 2013-05-08 LAB — COMPREHENSIVE METABOLIC PANEL (CC13)
ALBUMIN: 3.6 g/dL (ref 3.5–5.0)
ALT: 112 U/L — ABNORMAL HIGH (ref 0–55)
AST: 90 U/L — ABNORMAL HIGH (ref 5–34)
Alkaline Phosphatase: 118 U/L (ref 40–150)
Anion Gap: 10 mEq/L (ref 3–11)
BUN: 5.5 mg/dL — AB (ref 7.0–26.0)
CO2: 25 mEq/L (ref 22–29)
Calcium: 9.9 mg/dL (ref 8.4–10.4)
Chloride: 104 mEq/L (ref 98–109)
Creatinine: 0.8 mg/dL (ref 0.7–1.3)
GLUCOSE: 137 mg/dL (ref 70–140)
POTASSIUM: 4.5 meq/L (ref 3.5–5.1)
Sodium: 139 mEq/L (ref 136–145)
Total Bilirubin: 0.73 mg/dL (ref 0.20–1.20)
Total Protein: 7.1 g/dL (ref 6.4–8.3)

## 2013-05-08 LAB — CBC WITH DIFFERENTIAL/PLATELET
BASO%: 0.4 % (ref 0.0–2.0)
BASOS ABS: 0 10*3/uL (ref 0.0–0.1)
EOS ABS: 0.1 10*3/uL (ref 0.0–0.5)
EOS%: 1.5 % (ref 0.0–7.0)
HEMATOCRIT: 40.4 % (ref 38.4–49.9)
HGB: 13.5 g/dL (ref 13.0–17.1)
LYMPH%: 12.9 % — AB (ref 14.0–49.0)
MCH: 28.8 pg (ref 27.2–33.4)
MCHC: 33.4 g/dL (ref 32.0–36.0)
MCV: 86.1 fL (ref 79.3–98.0)
MONO#: 0.2 10*3/uL (ref 0.1–0.9)
MONO%: 1.6 % (ref 0.0–14.0)
NEUT%: 83.6 % — ABNORMAL HIGH (ref 39.0–75.0)
NEUTROS ABS: 8 10*3/uL — AB (ref 1.5–6.5)
Platelets: 277 10*3/uL (ref 140–400)
RBC: 4.69 10*6/uL (ref 4.20–5.82)
RDW: 16.8 % — AB (ref 11.0–14.6)
WBC: 9.5 10*3/uL (ref 4.0–10.3)
lymph#: 1.2 10*3/uL (ref 0.9–3.3)

## 2013-05-08 NOTE — Telephone Encounter (Signed)
Called pt back regarding handicap application.  I spoke with Dr. Julien Nordmann and he filled out application.  I notified pt application was completed and they can come and pick it up.

## 2013-05-08 NOTE — Telephone Encounter (Signed)
Pt wife called would like permanent handicap sticker.  I will speak with Dr. Julien Nordmann.

## 2013-05-13 ENCOUNTER — Other Ambulatory Visit (HOSPITAL_BASED_OUTPATIENT_CLINIC_OR_DEPARTMENT_OTHER): Payer: BC Managed Care – PPO

## 2013-05-13 ENCOUNTER — Telehealth: Payer: Self-pay | Admitting: Medical Oncology

## 2013-05-13 DIAGNOSIS — C787 Secondary malignant neoplasm of liver and intrahepatic bile duct: Secondary | ICD-10-CM

## 2013-05-13 DIAGNOSIS — C7800 Secondary malignant neoplasm of unspecified lung: Secondary | ICD-10-CM

## 2013-05-13 DIAGNOSIS — C7952 Secondary malignant neoplasm of bone marrow: Secondary | ICD-10-CM

## 2013-05-13 DIAGNOSIS — C155 Malignant neoplasm of lower third of esophagus: Secondary | ICD-10-CM

## 2013-05-13 DIAGNOSIS — C7951 Secondary malignant neoplasm of bone: Secondary | ICD-10-CM

## 2013-05-13 DIAGNOSIS — C159 Malignant neoplasm of esophagus, unspecified: Secondary | ICD-10-CM

## 2013-05-13 LAB — CBC WITH DIFFERENTIAL/PLATELET
BASO%: 0.9 % (ref 0.0–2.0)
Basophils Absolute: 0 10*3/uL (ref 0.0–0.1)
EOS%: 2.7 % (ref 0.0–7.0)
Eosinophils Absolute: 0.1 10*3/uL (ref 0.0–0.5)
HEMATOCRIT: 40.1 % (ref 38.4–49.9)
HGB: 13.4 g/dL (ref 13.0–17.1)
LYMPH%: 28.4 % (ref 14.0–49.0)
MCH: 28.7 pg (ref 27.2–33.4)
MCHC: 33.5 g/dL (ref 32.0–36.0)
MCV: 85.6 fL (ref 79.3–98.0)
MONO#: 0.5 10*3/uL (ref 0.1–0.9)
MONO%: 12.7 % (ref 0.0–14.0)
NEUT#: 2.1 10*3/uL (ref 1.5–6.5)
NEUT%: 55.3 % (ref 39.0–75.0)
PLATELETS: 188 10*3/uL (ref 140–400)
RBC: 4.68 10*6/uL (ref 4.20–5.82)
RDW: 17.3 % — ABNORMAL HIGH (ref 11.0–14.6)
WBC: 3.7 10*3/uL — ABNORMAL LOW (ref 4.0–10.3)
lymph#: 1.1 10*3/uL (ref 0.9–3.3)

## 2013-05-13 LAB — COMPREHENSIVE METABOLIC PANEL (CC13)
ALT: 86 U/L — AB (ref 0–55)
AST: 64 U/L — AB (ref 5–34)
Albumin: 3.6 g/dL (ref 3.5–5.0)
Alkaline Phosphatase: 106 U/L (ref 40–150)
Anion Gap: 12 mEq/L — ABNORMAL HIGH (ref 3–11)
BUN: 3.1 mg/dL — ABNORMAL LOW (ref 7.0–26.0)
CO2: 23 mEq/L (ref 22–29)
CREATININE: 0.8 mg/dL (ref 0.7–1.3)
Calcium: 9.5 mg/dL (ref 8.4–10.4)
Chloride: 103 mEq/L (ref 98–109)
Glucose: 118 mg/dl (ref 70–140)
Potassium: 3.7 mEq/L (ref 3.5–5.1)
Sodium: 138 mEq/L (ref 136–145)
Total Bilirubin: 0.66 mg/dL (ref 0.20–1.20)
Total Protein: 6.9 g/dL (ref 6.4–8.3)

## 2013-05-13 NOTE — Telephone Encounter (Signed)
1.Refill requested for oxycodone and pick up Friday,  2.can he drink a glass of sangria 3. can he postpone his chemo from 5/20 to 5/25 ( having fundraiser on 5/24 and family from [place] coming in.)

## 2013-05-13 NOTE — Telephone Encounter (Signed)
Per Dr Julien Nordmann I left a message for Larene [place] that Dr Julien Nordmann said it is okay to  postpone chemo for 1 week and to not take the xeloda until he starts back on iv chemo

## 2013-05-16 ENCOUNTER — Telehealth: Payer: Self-pay | Admitting: Medical Oncology

## 2013-05-16 NOTE — Telephone Encounter (Signed)
Next available appt for chemo is 5/29-wife notified and said okay

## 2013-05-17 ENCOUNTER — Telehealth: Payer: Self-pay | Admitting: Internal Medicine

## 2013-05-17 NOTE — Telephone Encounter (Signed)
per staff message responses from both [facility] and desk nurse (diane) - ok for pt to have tx 5/29. lb/fu will remain 5/20 per 5/1 pof. lmonvm for pt re 5/29 asking that he call to confirm before I cx 5/20. pt also informed that if this is ok lb/fu will remain 5/20 and tx will be 5/29.

## 2013-05-20 ENCOUNTER — Encounter (HOSPITAL_COMMUNITY): Payer: Self-pay

## 2013-05-20 ENCOUNTER — Ambulatory Visit (HOSPITAL_COMMUNITY)
Admission: RE | Admit: 2013-05-20 | Discharge: 2013-05-20 | Disposition: A | Discharge status: Home / Self Care | Payer: BC Managed Care – PPO | Source: Ambulatory Visit | Attending: Internal Medicine | Admitting: Internal Medicine

## 2013-05-20 ENCOUNTER — Ambulatory Visit: Payer: BC Managed Care – PPO | Admitting: Physical Therapy

## 2013-05-20 ENCOUNTER — Other Ambulatory Visit: Payer: BC Managed Care – PPO

## 2013-05-20 ENCOUNTER — Other Ambulatory Visit: Payer: Self-pay | Admitting: *Deleted

## 2013-05-20 ENCOUNTER — Ambulatory Visit: Payer: BC Managed Care – PPO

## 2013-05-20 DIAGNOSIS — C7952 Secondary malignant neoplasm of bone marrow: Secondary | ICD-10-CM

## 2013-05-20 DIAGNOSIS — C7951 Secondary malignant neoplasm of bone: Secondary | ICD-10-CM | POA: Insufficient documentation

## 2013-05-20 DIAGNOSIS — C7931 Secondary malignant neoplasm of brain: Secondary | ICD-10-CM

## 2013-05-20 DIAGNOSIS — C159 Malignant neoplasm of esophagus, unspecified: Secondary | ICD-10-CM | POA: Insufficient documentation

## 2013-05-20 DIAGNOSIS — C7949 Secondary malignant neoplasm of other parts of nervous system: Principal | ICD-10-CM

## 2013-05-20 MED ORDER — MORPHINE SULFATE ER 30 MG PO TBCR: EXTENDED_RELEASE_TABLET | ORAL | Status: DC

## 2013-05-20 MED ORDER — OXYCODONE HCL 5 MG PO CAPS: 5.0000 mg | ORAL_CAPSULE | Freq: Four times a day (QID) | ORAL | Status: DC | PRN

## 2013-05-20 MED ORDER — IOHEXOL 300 MG/ML  SOLN
100.0000 mL | Freq: Once | INTRAMUSCULAR | Status: AC | PRN
  Administered 2013-05-20: 100 mL via INTRAVENOUS

## 2013-05-22 ENCOUNTER — Telehealth: Payer: Self-pay | Admitting: Internal Medicine

## 2013-05-22 ENCOUNTER — Other Ambulatory Visit (HOSPITAL_BASED_OUTPATIENT_CLINIC_OR_DEPARTMENT_OTHER): Payer: BC Managed Care – PPO

## 2013-05-22 ENCOUNTER — Ambulatory Visit (HOSPITAL_BASED_OUTPATIENT_CLINIC_OR_DEPARTMENT_OTHER): Payer: BC Managed Care – PPO | Admitting: Internal Medicine

## 2013-05-22 ENCOUNTER — Telehealth: Payer: Self-pay | Admitting: *Deleted

## 2013-05-22 ENCOUNTER — Ambulatory Visit: Payer: BC Managed Care – PPO

## 2013-05-22 VITALS — BP 120/82 | HR 128 | Temp 97.1°F | Resp 18 | Ht 72.0 in | Wt 230.9 lb

## 2013-05-22 DIAGNOSIS — C155 Malignant neoplasm of lower third of esophagus: Secondary | ICD-10-CM

## 2013-05-22 DIAGNOSIS — C159 Malignant neoplasm of esophagus, unspecified: Secondary | ICD-10-CM

## 2013-05-22 DIAGNOSIS — C7951 Secondary malignant neoplasm of bone: Secondary | ICD-10-CM

## 2013-05-22 DIAGNOSIS — F329 Major depressive disorder, single episode, unspecified: Secondary | ICD-10-CM

## 2013-05-22 DIAGNOSIS — K7689 Other specified diseases of liver: Secondary | ICD-10-CM

## 2013-05-22 DIAGNOSIS — C787 Secondary malignant neoplasm of liver and intrahepatic bile duct: Secondary | ICD-10-CM

## 2013-05-22 DIAGNOSIS — F3289 Other specified depressive episodes: Secondary | ICD-10-CM

## 2013-05-22 DIAGNOSIS — G609 Hereditary and idiopathic neuropathy, unspecified: Secondary | ICD-10-CM

## 2013-05-22 DIAGNOSIS — C7952 Secondary malignant neoplasm of bone marrow: Secondary | ICD-10-CM

## 2013-05-22 LAB — COMPREHENSIVE METABOLIC PANEL (CC13)
ALT: 36 U/L (ref 0–55)
AST: 40 U/L — ABNORMAL HIGH (ref 5–34)
Albumin: 3.5 g/dL (ref 3.5–5.0)
Alkaline Phosphatase: 107 U/L (ref 40–150)
Anion Gap: 13 mEq/L — ABNORMAL HIGH (ref 3–11)
BUN: 4 mg/dL — AB (ref 7.0–26.0)
CALCIUM: 8.9 mg/dL (ref 8.4–10.4)
CO2: 23 meq/L (ref 22–29)
Chloride: 104 mEq/L (ref 98–109)
Creatinine: 0.8 mg/dL (ref 0.7–1.3)
GLUCOSE: 127 mg/dL (ref 70–140)
Potassium: 4.1 mEq/L (ref 3.5–5.1)
SODIUM: 141 meq/L (ref 136–145)
TOTAL PROTEIN: 6.5 g/dL (ref 6.4–8.3)
Total Bilirubin: 0.64 mg/dL (ref 0.20–1.20)

## 2013-05-22 LAB — CBC WITH DIFFERENTIAL/PLATELET
BASO%: 0.9 % (ref 0.0–2.0)
Basophils Absolute: 0.1 10*3/uL (ref 0.0–0.1)
EOS ABS: 0.1 10*3/uL (ref 0.0–0.5)
EOS%: 1 % (ref 0.0–7.0)
HCT: 42.1 % (ref 38.4–49.9)
HEMOGLOBIN: 14 g/dL (ref 13.0–17.1)
LYMPH#: 1.2 10*3/uL (ref 0.9–3.3)
LYMPH%: 20.3 % (ref 14.0–49.0)
MCH: 28.2 pg (ref 27.2–33.4)
MCHC: 33.3 g/dL (ref 32.0–36.0)
MCV: 84.9 fL (ref 79.3–98.0)
MONO#: 1.6 10*3/uL — ABNORMAL HIGH (ref 0.1–0.9)
MONO%: 26.5 % — ABNORMAL HIGH (ref 0.0–14.0)
NEUT#: 3 10*3/uL (ref 1.5–6.5)
NEUT%: 51.3 % (ref 39.0–75.0)
Platelets: 425 10*3/uL — ABNORMAL HIGH (ref 140–400)
RBC: 4.96 10*6/uL (ref 4.20–5.82)
RDW: 16.3 % — ABNORMAL HIGH (ref 11.0–14.6)
WBC: 5.9 10*3/uL (ref 4.0–10.3)
nRBC: 0 % (ref 0–0)

## 2013-05-22 NOTE — Telephone Encounter (Signed)
gv adn printed appts ched and avs for pt for May adn June....sed added tx.

## 2013-05-22 NOTE — Telephone Encounter (Signed)
gv adn printed appt sched and avs for pt for May and June....sed added tx. °

## 2013-05-22 NOTE — Progress Notes (Signed)
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  MOLECULAR BIOMARKERS: Foundation one: Amplification of PIK3CA, RAF1, CCND1, MYC(equivocal), PRKCl, SOX2, EPHB1, FGF19, FGF3, FGF4, GATA6, TERC and [id number], [id number].  PRIOR THERAPY:  1) status post T8-T10 spinal fusion under the care of Fred [name].  2) status post a stereotactic radiotherapy to the T9 lesion under the care of Fred [name]. 3) Systemic chemotherapy with EOX, (epirubicin, oxaliplatin and Xeloda) every 3 weeks. First dose on 03/18/2013, status post 3 cycles discontinued today secondary to disease progression.  CURRENT THERAPY: systemic chemotherapy with cisplatin 30 mg/M2 and irinotecan 65 mg/M2 days 1 and 8 every 3 weeks. First cycle expected on 05/31/2013.  INTERVAL HISTORY: Fred [name] 43 y.o. male returns to the clinic today for followup visit accompanied by his wife. The patient tolerated the last cycle of his chemotherapy with EOX fairly well with no significant adverse effects except for increasing fatigue. He also has weakness and neuropathy of the lower extremities. He is expected to start physical therapy at home soon. He also has lack of appetite but was able to keep his weight stable by eating small frequent meals. He denied having any dysphagia or odynophagia. He also continues to have low back pain rated 3 on a scale from 1-10. He denied having any significant fever or chills. He has no nausea or vomiting. The patient denied having any significant chest pain, shortness of breath, cough or hemoptysis. He has a fundraising event this coming weekend by his friend and he wants to delay any treatment until after the event. He had repeat CT scan of the chest, abdomen and pelvis performed recently and  he is here for evaluation and discussion of his scan results.  MEDICAL HISTORY: Past Medical History  Diagnosis Date  . 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 GERD (gastroesophageal reflux disease)     cannot taste anything  . 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  MEDICATIONS:  Current Outpatient Prescriptions  Medication Sig Dispense Refill  . capecitabine (XELODA) 500 MG tablet 3 tablets by mouth twice a day for 14 days every 3 weeks. Start with the first day of chemotherapy.  84 tablet  5  . gabapentin (NEURONTIN) 100 MG capsule Take 1 capsule (100 mg total) by mouth 3 (three) times daily.  90 capsule  1  . lidocaine-prilocaine (EMLA) cream Apply 1 application topically as needed. Apply to port 1 hr before chemo  30 g  0  . mirtazapine (REMERON) 30 MG tablet Take 1 tablet (30 mg total) by mouth at bedtime.  30 tablet  2  . morphine (MS CONTIN) 30 MG 12 hr tablet Take a 30 mg tablet EVER 8 HOURS  90 tablet  0  . omeprazole (PRILOSEC) 20 MG capsule Take 20 mg by mouth daily.      Marland Kitchen oxycodone (OXY-IR) 5 MG capsule Take 1 capsule (5 mg total) by mouth every 6 (six) hours as needed for pain (1-2 tablets every 6 hours as needed).  60 capsule  0  . prochlorperazine (COMPAZINE) 10 MG tablet Take 1 tablet (10 mg total) by mouth every 6 (six) hours as needed for nausea or vomiting.  60 tablet  0  No current facility-administered medications for this visit.   Facility-Administered Medications Ordered in Other Visits  Medication Dose Route Frequency Provider Last Rate Last Dose  . sodium chloride 0.9 % injection 10 mL  10 mL Intravenous PRN Curt Bears, MD   10 mL at 04/29/13 1105    REVIEW OF SYSTEMS:  Constitutional: negative Eyes: negative Ears, nose, mouth, throat, and face: negative Respiratory: negative Cardiovascular: negative Gastrointestinal: negative Genitourinary:negative Integument/breast:  negative Hematologic/lymphatic: negative Musculoskeletal:positive for back pain Neurological: negative Behavioral/Psych: negative Endocrine: negative Allergic/Immunologic: negative   PHYSICAL EXAMINATION: General appearance: alert, cooperative, fatigued and no distress Head: Normocephalic, without obvious abnormality, atraumatic Neck: no JVD, supple, symmetrical, trachea midline, thyroid not enlarged, symmetric, no tenderness/mass/nodules and Palpable small left supraclavicular lymph node Lymph nodes: Palpable small left supraclavicular lymph node Resp: clear to auscultation bilaterally Back: symmetric, no curvature. ROM normal. No CVA tenderness. Cardio: regular rate and rhythm, S1, S2 normal, no murmur, click, rub or gallop GI: soft, non-tender; bowel sounds normal; no masses,  no organomegaly Extremities: extremities normal, atraumatic, no cyanosis or edema Neurologic: Alert and oriented X 3, normal strength and tone. Normal symmetric reflexes. Normal coordination and gait  ECOG PERFORMANCE STATUS: 1 - Symptomatic but completely ambulatory  Blood pressure 120/82, pulse 128, temperature 97.1 F (36.2 C), temperature source Oral, resp. rate 18, height 6' (1.829 m), weight 230 lb 14.4 oz (104.736 kg).  LABORATORY DATA: Lab Results  Component Value Date   WBC 5.9 05/22/2013   HGB 14.0 05/22/2013   HCT 42.1 05/22/2013   MCV 84.9 05/22/2013   PLT 425* 05/22/2013      Chemistry      Component Value Date/Time   NA 138 05/13/2013 0818   NA 133* 02/14/2013 1505   K 3.7 05/13/2013 0818   K 3.9 02/14/2013 1505   CL 95* 02/11/2013 0954   CO2 23 05/13/2013 0818   CO2 28 02/11/2013 0954   BUN 3.1* 05/13/2013 0818   BUN 16 02/11/2013 0954   CREATININE 0.8 05/13/2013 0818   CREATININE 0.82 02/11/2013 0954      Component Value Date/Time   CALCIUM 9.5 05/13/2013 0818   CALCIUM 9.2 02/11/2013 0954   ALKPHOS 106 05/13/2013 0818   ALKPHOS 118* 02/11/2013 0954   AST 64* 05/13/2013 0818   AST 19 02/11/2013 0954    ALT 86* 05/13/2013 0818   ALT 35 02/11/2013 0954   BILITOT 0.66 05/13/2013 0818   BILITOT 1.0 02/11/2013 0954       RADIOGRAPHIC STUDIES: Ct Chest W Contrast  05/20/2013   CLINICAL DATA:  Esophageal cancer.  EXAM: CT CHEST, ABDOMEN, AND PELVIS WITH CONTRAST  TECHNIQUE: Multidetector CT imaging of the chest, abdomen and pelvis was performed following the standard protocol during bolus administration of intravenous contrast.  CONTRAST:  119mL OMNIPAQUE IOHEXOL 300 MG/ML  SOLN  COMPARISON:  CT scan 01/31/2013 and PET-CT 02/07/2013  FINDINGS:   CT CHEST FINDINGS  The chest wall is unremarkable and stable. A right-sided Port-A-Cath is noted. There is left-sided supraclavicular lymphadenopathy. This appears relatively stable when compared to the prior PET-CT. Maximal transverse diameter is 2.6 cm. No axillary adenopathy.  Examination of bony structures demonstrates stable stabilization hardware and bony decompression related to vertebral body tumor at T9. No new spinal lesions are identified. There is tumor involving the inferior aspect of the right glenoid. The sternum is intact.  The heart is normal in size. No pericardial effusion. The aorta is normal in caliber. No dissection. Interval reduction in mediastinal  lymphadenopathy. The prevascular node measure maximum of 19 mm and now measures 14 mm. A subcarinal node measured 16 mm and now measures 7 mm.  The esophageal mass measures a maximum of approximately 4.1 x 3.2 cm and previously measured 3.6 x 2.9 cm I suspect there is a new lymph node between the aorta in the azygos vein and just posterior to the midesophagus on image number 29. This measures 12 x 9 mm.  Examination of the lung parenchyma demonstrates some patchy areas of atelectasis. I Do not see any pulmonary nodules to suggest metastatic disease.    CT ABDOMEN AND PELVIS FINDINGS  Marked and diffuse fatty infiltration of the liver lessens conspicuity for liver lesions. There is a 2.2 x 2.0 cm right  hepatic lobe lesion on image number 43 which was much smaller on the prior CT scan. A right hepatic lobe lesion at the dome measures a maximum of 2.3 cm and was difficult to see on the prior study. The lesions seen in the left hepatic lobe on the prior study are not visualized for certain on this study. The gallbladder is normal. No common bile duct dilatation. The pancreas is normal. The spleen is normal. The adrenal glands and kidneys are stable. Right renal calculi are demonstrated along with a UPJ obstruction and mild hydronephrosis. A duplicated IVC is noted.  The stomach, duodenum, small bowel and colon are grossly normal. The appendix is normal. A gastrohepatic ligament node is relatively stable. New ill-defined nodular peritoneal implants are noted in the left upper quadrant. Small scattered stable mesenteric and retroperitoneal lymph nodes. The aorta and branch vessels are patent.  The bladder, prostate gland and seminal vesicles are unremarkable. No pelvic mass or lymphadenopathy. No inguinal mass or adenopathy.  New osseous metastatic lesions are noted in the pubic bones, left iliac bone with periosteal bone formation/spiculation, left sacrum, right iliac bone and a new lesion is noted in the L3 vertebral body.    IMPRESSION: 1. Stable appearing left supraclavicular adenopathy. 2. Slight interval decrease in size of the mediastinal adenopathy. 3. No definite findings for metastatic pulmonary disease. 4. Enlarging right hepatic lobe liver lesions. 5. 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 6. New bulky nodular peritoneal implants in the left upper quadrant. 7. Slightly larger esophageal mass and new or enlarged paraesophageal lymph node. 8. 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 9. Thoracic spine stabilization hardware and decompression related to T9 tumor.    Electronically Signed   By: Kalman Jewels M.D.   On: 05/20/2013 15:28   ASSESSMENT AND PLAN: This is a very pleasant and unfortunate 43 years old white male who is recently diagnosed with widely metastatic distal esophageal adenocarcinoma. The patient is status post stabilization of his thoracic vertebrae with resection of tumor from that area followed by stereotactic radiotherapy. The patient is currently on systemic chemotherapy with EOX status post 3 cycles and tolerating it fairly well. Unfortunately his recent CT scan of the chest, abdomen and pelvis showed evidence for disease progression with new lesions in the liver. I have a lengthy discussion with the patient and his wife today about his current condition and treatment options. I recommended for him to discontinue his current treatment with EOX. I discussed with the patient several options for his treatment including palliative care and hospice referral versus consideration of treatment with cisplatin 30 mg/M2 and  irinotecan 65 mg/M2 on days 1 and 8 every 3 weeks. I also discussed with the patient other treatment options including single agent docetaxel plus/minus Cyramza. After discussion of all the options, the patient is interested in proceeding with the treatment with cisplatin and irinotecan. He is expected to start the first cycle of this treatment on 05/31/2013. He prefers to keep his treatment on Fridays.  For pain management I started the patient on MS Contin 30 mg by mouth every 12 hours in addition to Percocet for breakthrough pain.  For the peripheral neuropathy, he is currently on Neurontin 100 mg by mouth 3 times a day. He would also continue on physical therapy at home to help with his weakness and peripheral neuropathy. For depression,  the patient is currently on Remeron 30 mg by mouth each bedtime. He would come back for followup visit in 4 weeks with the start of cycle #2 of his chemotherapy. He was advised to call  immediately if he has any concerning symptoms in the interval. The patient voices understanding of current disease status and treatment options and is in agreement with the current care plan.  All questions were answered. The patient knows to call the clinic with any problems, questions or concerns. We can certainly see the patient much sooner if necessary. I spent 20 minutes of face-to-face counseling with the patient and his wife out of the total visit time 30 minutes. Disclaimer: This note was dictated with voice recognition software. Similar sounding words can inadvertently be transcribed and may not be corrected upon review.

## 2013-05-22 NOTE — Telephone Encounter (Signed)
Per staff message and POF I have scheduled appts.  JMW  

## 2013-05-24 ENCOUNTER — Encounter: Payer: Self-pay | Admitting: Internal Medicine

## 2013-05-28 ENCOUNTER — Other Ambulatory Visit: Payer: BC Managed Care – PPO

## 2013-05-29 ENCOUNTER — Other Ambulatory Visit: Payer: BC Managed Care – PPO

## 2013-05-29 ENCOUNTER — Ambulatory Visit: Payer: BC Managed Care – PPO | Attending: Internal Medicine | Admitting: Physical Therapy

## 2013-05-29 DIAGNOSIS — M549 Dorsalgia, unspecified: Secondary | ICD-10-CM | POA: Insufficient documentation

## 2013-05-29 DIAGNOSIS — C159 Malignant neoplasm of esophagus, unspecified: Secondary | ICD-10-CM | POA: Insufficient documentation

## 2013-05-29 DIAGNOSIS — IMO0001 Reserved for inherently not codable concepts without codable children: Secondary | ICD-10-CM | POA: Insufficient documentation

## 2013-05-31 ENCOUNTER — Ambulatory Visit (HOSPITAL_BASED_OUTPATIENT_CLINIC_OR_DEPARTMENT_OTHER): Payer: BC Managed Care – PPO

## 2013-05-31 ENCOUNTER — Ambulatory Visit (HOSPITAL_BASED_OUTPATIENT_CLINIC_OR_DEPARTMENT_OTHER): Payer: BC Managed Care – PPO | Admitting: *Deleted

## 2013-05-31 ENCOUNTER — Ambulatory Visit: Payer: BC Managed Care – PPO

## 2013-05-31 VITALS — BP 124/91 | HR 114 | Temp 97.8°F | Resp 19

## 2013-05-31 DIAGNOSIS — C159 Malignant neoplasm of esophagus, unspecified: Secondary | ICD-10-CM

## 2013-05-31 DIAGNOSIS — C155 Malignant neoplasm of lower third of esophagus: Secondary | ICD-10-CM

## 2013-05-31 DIAGNOSIS — C800 Disseminated malignant neoplasm, unspecified: Secondary | ICD-10-CM

## 2013-05-31 DIAGNOSIS — Z5111 Encounter for antineoplastic chemotherapy: Secondary | ICD-10-CM

## 2013-05-31 DIAGNOSIS — C7952 Secondary malignant neoplasm of bone marrow: Secondary | ICD-10-CM

## 2013-05-31 DIAGNOSIS — C7951 Secondary malignant neoplasm of bone: Secondary | ICD-10-CM

## 2013-05-31 LAB — COMPREHENSIVE METABOLIC PANEL (CC13)
ALBUMIN: 3 g/dL — AB (ref 3.5–5.0)
ALT: 40 U/L (ref 0–55)
ANION GAP: 14 meq/L — AB (ref 3–11)
AST: 56 U/L — ABNORMAL HIGH (ref 5–34)
Alkaline Phosphatase: 111 U/L (ref 40–150)
BILIRUBIN TOTAL: 0.58 mg/dL (ref 0.20–1.20)
BUN: 3 mg/dL — ABNORMAL LOW (ref 7.0–26.0)
CO2: 21 meq/L — AB (ref 22–29)
Calcium: 8.6 mg/dL (ref 8.4–10.4)
Chloride: 105 mEq/L (ref 98–109)
Creatinine: 0.7 mg/dL (ref 0.7–1.3)
Glucose: 124 mg/dl (ref 70–140)
POTASSIUM: 3.8 meq/L (ref 3.5–5.1)
SODIUM: 140 meq/L (ref 136–145)
TOTAL PROTEIN: 6.1 g/dL — AB (ref 6.4–8.3)

## 2013-05-31 LAB — CBC WITH DIFFERENTIAL/PLATELET
BASO%: 1.5 % (ref 0.0–2.0)
Basophils Absolute: 0.1 10*3/uL (ref 0.0–0.1)
EOS%: 3.6 % (ref 0.0–7.0)
Eosinophils Absolute: 0.3 10*3/uL (ref 0.0–0.5)
HCT: 40.6 % (ref 38.4–49.9)
HGB: 13.2 g/dL (ref 13.0–17.1)
LYMPH#: 1.1 10*3/uL (ref 0.9–3.3)
LYMPH%: 12.6 % — AB (ref 14.0–49.0)
MCH: 28.3 pg (ref 27.2–33.4)
MCHC: 32.6 g/dL (ref 32.0–36.0)
MCV: 86.7 fL (ref 79.3–98.0)
MONO#: 0.9 10*3/uL (ref 0.1–0.9)
MONO%: 10.2 % (ref 0.0–14.0)
NEUT#: 6.4 10*3/uL (ref 1.5–6.5)
NEUT%: 72.1 % (ref 39.0–75.0)
Platelets: 288 10*3/uL (ref 140–400)
RBC: 4.68 10*6/uL (ref 4.20–5.82)
RDW: 17.4 % — AB (ref 11.0–14.6)
WBC: 8.8 10*3/uL (ref 4.0–10.3)

## 2013-05-31 MED ORDER — PALONOSETRON HCL INJECTION 0.25 MG/5ML
0.2500 mg | Freq: Once | INTRAVENOUS | Status: AC
  Administered 2013-05-31: 0.25 mg via INTRAVENOUS

## 2013-05-31 MED ORDER — SODIUM CHLORIDE 0.9 % IV SOLN
Freq: Once | INTRAVENOUS | Status: AC
  Administered 2013-05-31: 09:00:00 via INTRAVENOUS

## 2013-05-31 MED ORDER — HEPARIN SOD (PORK) LOCK FLUSH 100 UNIT/ML IV SOLN
500.0000 [IU] | Freq: Once | INTRAVENOUS | Status: AC
  Administered 2013-05-31: 500 [IU] via INTRAVENOUS
  Filled 2013-05-31: qty 5

## 2013-05-31 MED ORDER — ATROPINE SULFATE 1 MG/ML IJ SOLN
INTRAMUSCULAR | Status: AC
  Filled 2013-05-31: qty 1

## 2013-05-31 MED ORDER — ATROPINE SULFATE 1 MG/ML IJ SOLN
0.5000 mg | Freq: Once | INTRAMUSCULAR | Status: AC | PRN
  Administered 2013-05-31: 0.5 mg via INTRAVENOUS

## 2013-05-31 MED ORDER — SODIUM CHLORIDE 0.9 % IV SOLN
150.0000 mg | Freq: Once | INTRAVENOUS | Status: AC
  Administered 2013-05-31: 150 mg via INTRAVENOUS
  Filled 2013-05-31: qty 5

## 2013-05-31 MED ORDER — SODIUM CHLORIDE 0.9 % IV SOLN
30.0000 mg/m2 | Freq: Once | INTRAVENOUS | Status: AC
  Administered 2013-05-31: 70 mg via INTRAVENOUS
  Filled 2013-05-31: qty 70

## 2013-05-31 MED ORDER — SODIUM CHLORIDE 0.9 % IJ SOLN
10.0000 mL | INTRAMUSCULAR | Status: DC | PRN
  Filled 2013-05-31: qty 10

## 2013-05-31 MED ORDER — DEXAMETHASONE SODIUM PHOSPHATE 20 MG/5ML IJ SOLN
12.0000 mg | Freq: Once | INTRAMUSCULAR | Status: AC
  Administered 2013-05-31: 12 mg via INTRAVENOUS

## 2013-05-31 MED ORDER — DEXAMETHASONE SODIUM PHOSPHATE 20 MG/5ML IJ SOLN
INTRAMUSCULAR | Status: AC
  Filled 2013-05-31: qty 5

## 2013-05-31 MED ORDER — SODIUM CHLORIDE 0.9 % IJ SOLN
10.0000 mL | INTRAMUSCULAR | Status: DC | PRN
  Administered 2013-05-31 (×2): 10 mL via INTRAVENOUS
  Filled 2013-05-31: qty 10

## 2013-05-31 MED ORDER — IRINOTECAN HCL CHEMO INJECTION 100 MG/5ML
65.0000 mg/m2 | Freq: Once | INTRAVENOUS | Status: AC
  Administered 2013-05-31: 150 mg via INTRAVENOUS
  Filled 2013-05-31: qty 7.5

## 2013-05-31 MED ORDER — POTASSIUM CHLORIDE 2 MEQ/ML IV SOLN
Freq: Once | INTRAVENOUS | Status: AC
  Administered 2013-05-31: 09:00:00 via INTRAVENOUS
  Filled 2013-05-31: qty 10

## 2013-05-31 MED ORDER — PALONOSETRON HCL INJECTION 0.25 MG/5ML
INTRAVENOUS | Status: AC
  Filled 2013-05-31: qty 5

## 2013-05-31 MED ORDER — HEPARIN SOD (PORK) LOCK FLUSH 100 UNIT/ML IV SOLN
500.0000 [IU] | Freq: Once | INTRAVENOUS | Status: DC | PRN
  Filled 2013-05-31: qty 5

## 2013-05-31 NOTE — Patient Instructions (Signed)
[place] Discharge Instructions for Patients Receiving Chemotherapy  Today you received the following chemotherapy agents: Cisplatin, Irinotecan  To help prevent nausea and vomiting after your treatment, we encourage you to take your nausea medication: Compazine 10 mg every 6 hrs as needed.    If you develop nausea and vomiting that is not controlled by your nausea medication, call the clinic.   BELOW ARE SYMPTOMS THAT SHOULD BE REPORTED IMMEDIATELY:  *FEVER GREATER THAN 100.5 F  *CHILLS WITH OR WITHOUT FEVER  NAUSEA AND VOMITING THAT IS NOT CONTROLLED WITH YOUR NAUSEA MEDICATION  *UNUSUAL SHORTNESS OF BREATH  *UNUSUAL BRUISING OR BLEEDING  TENDERNESS IN MOUTH AND THROAT WITH OR WITHOUT PRESENCE OF ULCERS  *URINARY PROBLEMS  *BOWEL PROBLEMS  UNUSUAL RASH Items with * indicate a potential emergency and should be followed up as soon as possible.  Feel free to call the clinic you have any questions or concerns. The clinic phone number is [phone].  Irinotecan injection What is this medicine? IRINOTECAN (ir in oh TEE kan ) is a chemotherapy drug. It is used to treat colon and rectal cancer. This medicine may be used for other purposes; ask your health care provider or pharmacist if you have questions. COMMON BRAND NAME(S): Camptosar What should I tell my health care provider before I take this medicine? They need to know if you have any of these conditions: -blood disorders -dehydration -diarrhea -infection (especially a virus infection such as chickenpox, cold sores, or herpes) -liver disease -low blood counts, like low white cell, platelet, or red cell counts -recent or ongoing radiation therapy -an unusual or allergic reaction to irinotecan, sorbitol, other chemotherapy, other medicines, foods, dyes, or preservatives -pregnant or trying to get pregnant -breast-feeding How should I use this medicine? This drug is given as an infusion into a vein.  It is administered in a hospital or clinic by a specially trained health care professional. Talk to your pediatrician regarding the use of this medicine in children. Special care may be needed. Overdosage: If you think you have taken too much of this medicine contact a poison control center or emergency room at once. NOTE: This medicine is only for you. Do not share this medicine with others. What if I miss a dose? It is important not to miss your dose. Call your doctor or health care professional if you are unable to keep an appointment. What may interact with this medicine? Do not take this medicine with any of the following medications: -atazanavir -ketoconazole -St. John's Wort This medicine may also interact with the following medications: -dexamethasone -diuretics -laxatives -medicines for seizures like carbamazepine, mephobarbital, phenobarbital, phenytoin, primidone -medicines to increase blood counts like filgrastim, pegfilgrastim, sargramostim -prochlorperazine -vaccines This list may not describe all possible interactions. Give your health care provider a list of all the medicines, herbs, non-prescription drugs, or dietary supplements you use. Also tell them if you smoke, drink alcohol, or use illegal drugs. Some items may interact with your medicine. What should I watch for while using this medicine? Your condition will be monitored carefully while you are receiving this medicine. You will need important blood work done while you are taking this medicine. This drug may make you feel generally unwell. This is not uncommon, as chemotherapy can affect healthy cells as well as cancer cells. Report any side effects. Continue your course of treatment even though you feel ill unless your doctor tells you to stop. In some cases, you may be given additional medicines  to help with side effects. Follow all directions for their use. You may get drowsy or dizzy. Do not drive, use machinery, or  do anything that needs mental alertness until you know how this medicine affects you. Do not stand or sit up quickly, especially if you are an older patient. This reduces the risk of dizzy or fainting spells. Call your doctor or health care professional for advice if you get a fever, chills or sore throat, or other symptoms of a cold or flu. Do not treat yourself. This drug decreases your body's ability to fight infections. Try to avoid being around people who are sick. This medicine may increase your risk to bruise or bleed. Call your doctor or health care professional if you notice any unusual bleeding. Be careful brushing and flossing your teeth or using a toothpick because you may get an infection or bleed more easily. If you have any dental work done, tell your dentist you are receiving this medicine. Avoid taking products that contain aspirin, acetaminophen, ibuprofen, naproxen, or ketoprofen unless instructed by your doctor. These medicines may hide a fever. Do not become pregnant while taking this medicine. Women should inform their doctor if they wish to become pregnant or think they might be pregnant. There is a potential for serious side effects to an unborn child. Talk to your health care professional or pharmacist for more information. Do not breast-feed an infant while taking this medicine. What side effects may I notice from receiving this medicine? Side effects that you should report to your doctor or health care professional as soon as possible: -allergic reactions like skin rash, itching or hives, swelling of the face, lips, or tongue -low blood counts - this medicine may decrease the number of white blood cells, red blood cells and platelets. You may be at increased risk for infections and bleeding. -signs of infection - fever or chills, cough, sore throat, pain or difficulty passing urine -signs of decreased platelets or bleeding - bruising, pinpoint red spots on the skin, black, tarry  stools, blood in the urine -signs of decreased red blood cells - unusually weak or tired, fainting spells, lightheadedness -breathing problems -chest pain -diarrhea -feeling faint or lightheaded, falls -flushing, runny nose, sweating during infusion -mouth sores or pain -pain, swelling, redness or irritation where injected -pain, swelling, warmth in the leg -pain, tingling, numbness in the hands or feet -problems with balance, talking, walking -stomach cramps, pain -trouble passing urine or change in the amount of urine -vomiting as to be unable to hold down drinks or food -yellowing of the eyes or skin Side effects that usually do not require medical attention (report to your doctor or health care professional if they continue or are bothersome): -constipation -hair loss -headache -loss of appetite -nausea, vomiting -stomach upset This list may not describe all possible side effects. Call your doctor for medical advice about side effects. You may report side effects to FDA at 1-800-FDA-1088. Where should I keep my medicine? This drug is given in a hospital or clinic and will not be stored at home. NOTE: This sheet is a summary. It may not cover all possible information. If you have questions about this medicine, talk to your doctor, pharmacist, or health care provider.  2014, Elsevier/Gold Standard. (2007-05-08 16:29:12)  Cisplatin injection What is this medicine? CISPLATIN (SIS pla tin) is a chemotherapy drug. It targets fast dividing cells, like cancer cells, and causes these cells to die. This medicine is used to treat many types  of cancer like bladder, ovarian, and testicular cancers. This medicine may be used for other purposes; ask your health care provider or pharmacist if you have questions. COMMON BRAND NAME(S): Platinol -AQ, Platinol What should I tell my health care provider before I take this medicine? They need to know if you have any of these conditions: -blood  disorders -hearing problems -kidney disease -recent or ongoing radiation therapy -an unusual or allergic reaction to cisplatin, carboplatin, other chemotherapy, other medicines, foods, dyes, or preservatives -pregnant or trying to get pregnant -breast-feeding How should I use this medicine? This drug is given as an infusion into a vein. It is administered in a hospital or clinic by a specially trained health care professional. Talk to your pediatrician regarding the use of this medicine in children. Special care may be needed. Overdosage: If you think you have taken too much of this medicine contact a poison control center or emergency room at once. NOTE: This medicine is only for you. Do not share this medicine with others. What if I miss a dose? It is important not to miss a dose. Call your doctor or health care professional if you are unable to keep an appointment. What may interact with this medicine? -dofetilide -foscarnet -medicines for seizures -medicines to increase blood counts like filgrastim, pegfilgrastim, sargramostim -probenecid -pyridoxine used with altretamine -rituximab -some antibiotics like amikacin, gentamicin, neomycin, polymyxin B, streptomycin, tobramycin -sulfinpyrazone -vaccines -zalcitabine Talk to your doctor or health care professional before taking any of these medicines: -acetaminophen -aspirin -ibuprofen -ketoprofen -naproxen This list may not describe all possible interactions. Give your health care provider a list of all the medicines, herbs, non-prescription drugs, or dietary supplements you use. Also tell them if you smoke, drink alcohol, or use illegal drugs. Some items may interact with your medicine. What should I watch for while using this medicine? Your condition will be monitored carefully while you are receiving this medicine. You will need important blood work done while you are taking this medicine. This drug may make you feel generally  unwell. This is not uncommon, as chemotherapy can affect healthy cells as well as cancer cells. Report any side effects. Continue your course of treatment even though you feel ill unless your doctor tells you to stop. In some cases, you may be given additional medicines to help with side effects. Follow all directions for their use. Call your doctor or health care professional for advice if you get a fever, chills or sore throat, or other symptoms of a cold or flu. Do not treat yourself. This drug decreases your body's ability to fight infections. Try to avoid being around people who are sick. This medicine may increase your risk to bruise or bleed. Call your doctor or health care professional if you notice any unusual bleeding. Be careful brushing and flossing your teeth or using a toothpick because you may get an infection or bleed more easily. If you have any dental work done, tell your dentist you are receiving this medicine. Avoid taking products that contain aspirin, acetaminophen, ibuprofen, naproxen, or ketoprofen unless instructed by your doctor. These medicines may hide a fever. Do not become pregnant while taking this medicine. Women should inform their doctor if they wish to become pregnant or think they might be pregnant. There is a potential for serious side effects to an unborn child. Talk to your health care professional or pharmacist for more information. Do not breast-feed an infant while taking this medicine. Drink fluids as directed while you  are taking this medicine. This will help protect your kidneys. Call your doctor or health care professional if you get diarrhea. Do not treat yourself. What side effects may I notice from receiving this medicine? Side effects that you should report to your doctor or health care professional as soon as possible: -allergic reactions like skin rash, itching or hives, swelling of the face, lips, or tongue -signs of infection - fever or chills, cough,  sore throat, pain or difficulty passing urine -signs of decreased platelets or bleeding - bruising, pinpoint red spots on the skin, black, tarry stools, nosebleeds -signs of decreased red blood cells - unusually weak or tired, fainting spells, lightheadedness -breathing problems -changes in hearing -gout pain -low blood counts - This drug may decrease the number of white blood cells, red blood cells and platelets. You may be at increased risk for infections and bleeding. -nausea and vomiting -pain, swelling, redness or irritation at the injection site -pain, tingling, numbness in the hands or feet -problems with balance, movement -trouble passing urine or change in the amount of urine Side effects that usually do not require medical attention (report to your doctor or health care professional if they continue or are bothersome): -changes in vision -loss of appetite -metallic taste in the mouth or changes in taste This list may not describe all possible side effects. Call your doctor for medical advice about side effects. You may report side effects to FDA at 1-800-FDA-1088. Where should I keep my medicine? This drug is given in a hospital or clinic and will not be stored at home. NOTE: This sheet is a summary. It may not cover all possible information. If you have questions about this medicine, talk to your doctor, pharmacist, or health care provider.  2014, Elsevier/Gold Standard. (2007-03-27 14:40:54)

## 2013-06-03 ENCOUNTER — Telehealth: Payer: Self-pay | Admitting: *Deleted

## 2013-06-03 NOTE — Telephone Encounter (Signed)
NO RETURN CALL AT THIS TIME.

## 2013-06-04 ENCOUNTER — Ambulatory Visit: Payer: BC Managed Care – PPO | Admitting: Physical Therapy

## 2013-06-05 ENCOUNTER — Other Ambulatory Visit: Payer: BC Managed Care – PPO

## 2013-06-06 ENCOUNTER — Ambulatory Visit: Payer: BC Managed Care – PPO | Attending: Internal Medicine

## 2013-06-06 DIAGNOSIS — M549 Dorsalgia, unspecified: Secondary | ICD-10-CM | POA: Diagnosis not present

## 2013-06-06 DIAGNOSIS — IMO0001 Reserved for inherently not codable concepts without codable children: Secondary | ICD-10-CM | POA: Diagnosis present

## 2013-06-06 DIAGNOSIS — C159 Malignant neoplasm of esophagus, unspecified: Secondary | ICD-10-CM | POA: Diagnosis not present

## 2013-06-07 ENCOUNTER — Telehealth: Payer: Self-pay | Admitting: Medical Oncology

## 2013-06-07 ENCOUNTER — Ambulatory Visit (HOSPITAL_BASED_OUTPATIENT_CLINIC_OR_DEPARTMENT_OTHER): Payer: BC Managed Care – PPO

## 2013-06-07 ENCOUNTER — Other Ambulatory Visit (HOSPITAL_BASED_OUTPATIENT_CLINIC_OR_DEPARTMENT_OTHER): Payer: BC Managed Care – PPO

## 2013-06-07 VITALS — BP 117/83 | HR 112 | Temp 97.1°F

## 2013-06-07 DIAGNOSIS — C159 Malignant neoplasm of esophagus, unspecified: Secondary | ICD-10-CM

## 2013-06-07 DIAGNOSIS — C7951 Secondary malignant neoplasm of bone: Secondary | ICD-10-CM

## 2013-06-07 DIAGNOSIS — Z5111 Encounter for antineoplastic chemotherapy: Secondary | ICD-10-CM

## 2013-06-07 DIAGNOSIS — C155 Malignant neoplasm of lower third of esophagus: Secondary | ICD-10-CM

## 2013-06-07 DIAGNOSIS — C7952 Secondary malignant neoplasm of bone marrow: Secondary | ICD-10-CM

## 2013-06-07 LAB — CBC WITH DIFFERENTIAL/PLATELET
BASO%: 0.3 % (ref 0.0–2.0)
BASOS ABS: 0 10*3/uL (ref 0.0–0.1)
EOS%: 4.1 % (ref 0.0–7.0)
Eosinophils Absolute: 0.5 10*3/uL (ref 0.0–0.5)
HEMATOCRIT: 41.1 % (ref 38.4–49.9)
HGB: 13.8 g/dL (ref 13.0–17.1)
LYMPH%: 13.2 % — AB (ref 14.0–49.0)
MCH: 28.2 pg (ref 27.2–33.4)
MCHC: 33.6 g/dL (ref 32.0–36.0)
MCV: 84 fL (ref 79.3–98.0)
MONO#: 0.9 10*3/uL (ref 0.1–0.9)
MONO%: 7.5 % (ref 0.0–14.0)
NEUT#: 8.6 10*3/uL — ABNORMAL HIGH (ref 1.5–6.5)
NEUT%: 74.9 % (ref 39.0–75.0)
PLATELETS: 236 10*3/uL (ref 140–400)
RBC: 4.89 10*6/uL (ref 4.20–5.82)
RDW: 15.2 % — ABNORMAL HIGH (ref 11.0–14.6)
WBC: 11.6 10*3/uL — AB (ref 4.0–10.3)
lymph#: 1.5 10*3/uL (ref 0.9–3.3)

## 2013-06-07 LAB — COMPREHENSIVE METABOLIC PANEL (CC13)
ALT: 162 U/L — ABNORMAL HIGH (ref 0–55)
ANION GAP: 17 meq/L — AB (ref 3–11)
AST: 132 U/L — ABNORMAL HIGH (ref 5–34)
Albumin: 3.4 g/dL — ABNORMAL LOW (ref 3.5–5.0)
Alkaline Phosphatase: 117 U/L (ref 40–150)
BUN: 4.4 mg/dL — AB (ref 7.0–26.0)
CO2: 22 meq/L (ref 22–29)
CREATININE: 0.8 mg/dL (ref 0.7–1.3)
Calcium: 9.4 mg/dL (ref 8.4–10.4)
Chloride: 99 mEq/L (ref 98–109)
Glucose: 126 mg/dl (ref 70–140)
Potassium: 3.9 mEq/L (ref 3.5–5.1)
Sodium: 138 mEq/L (ref 136–145)
Total Bilirubin: 0.63 mg/dL (ref 0.20–1.20)
Total Protein: 7.3 g/dL (ref 6.4–8.3)

## 2013-06-07 MED ORDER — ATROPINE SULFATE 1 MG/ML IJ SOLN
INTRAMUSCULAR | Status: AC
  Filled 2013-06-07: qty 1

## 2013-06-07 MED ORDER — SODIUM CHLORIDE 0.9 % IJ SOLN
10.0000 mL | INTRAMUSCULAR | Status: DC | PRN
  Administered 2013-06-07: 10 mL
  Filled 2013-06-07: qty 10

## 2013-06-07 MED ORDER — POTASSIUM CHLORIDE 2 MEQ/ML IV SOLN
Freq: Once | INTRAVENOUS | Status: AC
  Administered 2013-06-07: 10:00:00 via INTRAVENOUS
  Filled 2013-06-07: qty 10

## 2013-06-07 MED ORDER — DEXTROSE 5 % IV SOLN
65.0000 mg/m2 | Freq: Once | INTRAVENOUS | Status: AC
  Administered 2013-06-07: 150 mg via INTRAVENOUS
  Filled 2013-06-07: qty 7.5

## 2013-06-07 MED ORDER — DEXAMETHASONE SODIUM PHOSPHATE 20 MG/5ML IJ SOLN
INTRAMUSCULAR | Status: AC
Start: 2013-06-07 — End: 2013-06-07
  Filled 2013-06-07: qty 5

## 2013-06-07 MED ORDER — ATROPINE SULFATE 1 MG/ML IJ SOLN
0.5000 mg | Freq: Once | INTRAMUSCULAR | Status: AC | PRN
  Administered 2013-06-07: 0.5 mg via INTRAVENOUS

## 2013-06-07 MED ORDER — PALONOSETRON HCL INJECTION 0.25 MG/5ML
INTRAVENOUS | Status: AC
  Filled 2013-06-07: qty 5

## 2013-06-07 MED ORDER — DEXAMETHASONE SODIUM PHOSPHATE 20 MG/5ML IJ SOLN
12.0000 mg | Freq: Once | INTRAMUSCULAR | Status: AC
  Administered 2013-06-07: 12 mg via INTRAVENOUS

## 2013-06-07 MED ORDER — PALONOSETRON HCL INJECTION 0.25 MG/5ML
0.2500 mg | Freq: Once | INTRAVENOUS | Status: AC
  Administered 2013-06-07: 0.25 mg via INTRAVENOUS

## 2013-06-07 MED ORDER — SODIUM CHLORIDE 0.9 % IV SOLN
30.0000 mg/m2 | Freq: Once | INTRAVENOUS | Status: AC
  Administered 2013-06-07: 70 mg via INTRAVENOUS
  Filled 2013-06-07: qty 70

## 2013-06-07 MED ORDER — HEPARIN SOD (PORK) LOCK FLUSH 100 UNIT/ML IV SOLN
500.0000 [IU] | Freq: Once | INTRAVENOUS | Status: AC | PRN
  Administered 2013-06-07: 500 [IU]
  Filled 2013-06-07: qty 5

## 2013-06-07 MED ORDER — SODIUM CHLORIDE 0.9 % IV SOLN
Freq: Once | INTRAVENOUS | Status: AC
  Administered 2013-06-07: 10:00:00 via INTRAVENOUS

## 2013-06-07 MED ORDER — SODIUM CHLORIDE 0.9 % IV SOLN
150.0000 mg | Freq: Once | INTRAVENOUS | Status: AC
  Administered 2013-06-07: 150 mg via INTRAVENOUS
  Filled 2013-06-07: qty 5

## 2013-06-07 NOTE — Patient Instructions (Signed)
[facility] Discharge Instructions for Patients Receiving Chemotherapy  Today you received the following chemotherapy agents Irinotecan/Cisplatin To help prevent nausea and vomiting after your treatment, we encourage you to take your nausea medication as prescribed.  If you develop nausea and vomiting that is not controlled by your nausea medication, call the clinic.   BELOW ARE SYMPTOMS THAT SHOULD BE REPORTED IMMEDIATELY:  *FEVER GREATER THAN 100.5 F  *CHILLS WITH OR WITHOUT FEVER  NAUSEA AND VOMITING THAT IS NOT CONTROLLED WITH YOUR NAUSEA MEDICATION  *UNUSUAL SHORTNESS OF BREATH  *UNUSUAL BRUISING OR BLEEDING  TENDERNESS IN MOUTH AND THROAT WITH OR WITHOUT PRESENCE OF ULCERS  *URINARY PROBLEMS  *BOWEL PROBLEMS  UNUSUAL RASH Items with * indicate a potential emergency and should be followed up as soon as possible.  Feel free to call the clinic you have any questions or concerns. The clinic phone number is [phone].

## 2013-06-07 NOTE — Telephone Encounter (Signed)
Informed [facility] rep that pt is no longer taking Xeloda.

## 2013-06-07 NOTE — Progress Notes (Signed)
Patient complains of intermittent dull, achy pain in his mid to lower back rating it a 5 on a 0 to 10 pain scale. Patient repositioned in recliner and patient took pain medication in infusion room.

## 2013-06-11 ENCOUNTER — Ambulatory Visit: Payer: BC Managed Care – PPO | Admitting: Physical Therapy

## 2013-06-12 ENCOUNTER — Other Ambulatory Visit: Payer: BC Managed Care – PPO

## 2013-06-12 ENCOUNTER — Ambulatory Visit: Payer: BC Managed Care – PPO

## 2013-06-13 ENCOUNTER — Ambulatory Visit: Payer: BC Managed Care – PPO

## 2013-06-14 ENCOUNTER — Other Ambulatory Visit (HOSPITAL_BASED_OUTPATIENT_CLINIC_OR_DEPARTMENT_OTHER): Payer: BC Managed Care – PPO

## 2013-06-14 DIAGNOSIS — C159 Malignant neoplasm of esophagus, unspecified: Secondary | ICD-10-CM

## 2013-06-14 DIAGNOSIS — C155 Malignant neoplasm of lower third of esophagus: Secondary | ICD-10-CM

## 2013-06-14 LAB — COMPREHENSIVE METABOLIC PANEL (CC13)
ALK PHOS: 121 U/L (ref 40–150)
ALT: 174 U/L — ABNORMAL HIGH (ref 0–55)
AST: 92 U/L — AB (ref 5–34)
Albumin: 3.7 g/dL (ref 3.5–5.0)
Anion Gap: 11 mEq/L (ref 3–11)
BUN: 5.1 mg/dL — ABNORMAL LOW (ref 7.0–26.0)
CO2: 25 mEq/L (ref 22–29)
Calcium: 9.6 mg/dL (ref 8.4–10.4)
Chloride: 98 mEq/L (ref 98–109)
Creatinine: 0.9 mg/dL (ref 0.7–1.3)
Glucose: 132 mg/dl (ref 70–140)
Potassium: 4.3 mEq/L (ref 3.5–5.1)
SODIUM: 134 meq/L — AB (ref 136–145)
TOTAL PROTEIN: 7.6 g/dL (ref 6.4–8.3)
Total Bilirubin: 0.73 mg/dL (ref 0.20–1.20)

## 2013-06-14 LAB — CBC WITH DIFFERENTIAL/PLATELET
BASO%: 0.8 % (ref 0.0–2.0)
Basophils Absolute: 0.1 10*3/uL (ref 0.0–0.1)
EOS%: 2.4 % (ref 0.0–7.0)
Eosinophils Absolute: 0.2 10*3/uL (ref 0.0–0.5)
HCT: 41.5 % (ref 38.4–49.9)
HGB: 13.7 g/dL (ref 13.0–17.1)
LYMPH%: 13.7 % — AB (ref 14.0–49.0)
MCH: 28.5 pg (ref 27.2–33.4)
MCHC: 33.1 g/dL (ref 32.0–36.0)
MCV: 86 fL (ref 79.3–98.0)
MONO#: 0.4 10*3/uL (ref 0.1–0.9)
MONO%: 5.1 % (ref 0.0–14.0)
NEUT%: 78 % — ABNORMAL HIGH (ref 39.0–75.0)
NEUTROS ABS: 6.8 10*3/uL — AB (ref 1.5–6.5)
Platelets: 279 10*3/uL (ref 140–400)
RBC: 4.82 10*6/uL (ref 4.20–5.82)
RDW: 16.7 % — ABNORMAL HIGH (ref 11.0–14.6)
WBC: 8.7 10*3/uL (ref 4.0–10.3)
lymph#: 1.2 10*3/uL (ref 0.9–3.3)

## 2013-06-17 ENCOUNTER — Other Ambulatory Visit: Payer: Self-pay | Admitting: *Deleted

## 2013-06-17 DIAGNOSIS — C7931 Secondary malignant neoplasm of brain: Secondary | ICD-10-CM

## 2013-06-17 DIAGNOSIS — C7949 Secondary malignant neoplasm of other parts of nervous system: Principal | ICD-10-CM

## 2013-06-17 MED ORDER — OXYCODONE HCL 5 MG PO CAPS: 5.0000 mg | ORAL_CAPSULE | Freq: Four times a day (QID) | ORAL | Status: DC | PRN

## 2013-06-18 ENCOUNTER — Ambulatory Visit: Payer: BC Managed Care – PPO | Admitting: Physical Therapy

## 2013-06-18 DIAGNOSIS — IMO0001 Reserved for inherently not codable concepts without codable children: Secondary | ICD-10-CM | POA: Diagnosis not present

## 2013-06-19 ENCOUNTER — Other Ambulatory Visit: Payer: BC Managed Care – PPO

## 2013-06-20 ENCOUNTER — Encounter: Payer: BC Managed Care – PPO | Admitting: Physical Therapy

## 2013-06-21 ENCOUNTER — Other Ambulatory Visit (HOSPITAL_BASED_OUTPATIENT_CLINIC_OR_DEPARTMENT_OTHER): Payer: BC Managed Care – PPO

## 2013-06-21 ENCOUNTER — Ambulatory Visit: Payer: BC Managed Care – PPO

## 2013-06-21 ENCOUNTER — Ambulatory Visit (HOSPITAL_BASED_OUTPATIENT_CLINIC_OR_DEPARTMENT_OTHER): Payer: BC Managed Care – PPO | Admitting: Internal Medicine

## 2013-06-21 ENCOUNTER — Encounter: Payer: Self-pay | Admitting: Internal Medicine

## 2013-06-21 ENCOUNTER — Ambulatory Visit (HOSPITAL_BASED_OUTPATIENT_CLINIC_OR_DEPARTMENT_OTHER): Payer: BC Managed Care – PPO

## 2013-06-21 ENCOUNTER — Telehealth: Payer: Self-pay | Admitting: Internal Medicine

## 2013-06-21 VITALS — BP 141/80 | HR 123 | Temp 97.8°F | Resp 18 | Ht 72.0 in | Wt 220.4 lb

## 2013-06-21 DIAGNOSIS — C7952 Secondary malignant neoplasm of bone marrow: Secondary | ICD-10-CM

## 2013-06-21 DIAGNOSIS — C159 Malignant neoplasm of esophagus, unspecified: Secondary | ICD-10-CM

## 2013-06-21 DIAGNOSIS — C7951 Secondary malignant neoplasm of bone: Secondary | ICD-10-CM

## 2013-06-21 DIAGNOSIS — C800 Disseminated malignant neoplasm, unspecified: Secondary | ICD-10-CM

## 2013-06-21 DIAGNOSIS — K769 Liver disease, unspecified: Secondary | ICD-10-CM

## 2013-06-21 DIAGNOSIS — R1310 Dysphagia, unspecified: Secondary | ICD-10-CM

## 2013-06-21 DIAGNOSIS — C155 Malignant neoplasm of lower third of esophagus: Secondary | ICD-10-CM

## 2013-06-21 DIAGNOSIS — Z5111 Encounter for antineoplastic chemotherapy: Secondary | ICD-10-CM

## 2013-06-21 LAB — CBC WITH DIFFERENTIAL/PLATELET
BASO%: 1.1 % (ref 0.0–2.0)
BASOS ABS: 0 10*3/uL (ref 0.0–0.1)
EOS%: 7.5 % — AB (ref 0.0–7.0)
Eosinophils Absolute: 0.3 10*3/uL (ref 0.0–0.5)
HEMATOCRIT: 35.1 % — AB (ref 38.4–49.9)
HEMOGLOBIN: 11.6 g/dL — AB (ref 13.0–17.1)
LYMPH%: 25.2 % (ref 14.0–49.0)
MCH: 28.4 pg (ref 27.2–33.4)
MCHC: 33 g/dL (ref 32.0–36.0)
MCV: 86.3 fL (ref 79.3–98.0)
MONO#: 0.6 10*3/uL (ref 0.1–0.9)
MONO%: 17 % — ABNORMAL HIGH (ref 0.0–14.0)
NEUT#: 1.7 10*3/uL (ref 1.5–6.5)
NEUT%: 49.2 % (ref 39.0–75.0)
Platelets: 332 10*3/uL (ref 140–400)
RBC: 4.06 10*6/uL — ABNORMAL LOW (ref 4.20–5.82)
RDW: 17.9 % — ABNORMAL HIGH (ref 11.0–14.6)
WBC: 3.5 10*3/uL — ABNORMAL LOW (ref 4.0–10.3)
lymph#: 0.9 10*3/uL (ref 0.9–3.3)

## 2013-06-21 LAB — COMPREHENSIVE METABOLIC PANEL (CC13)
ALT: 59 U/L — AB (ref 0–55)
ANION GAP: 11 meq/L (ref 3–11)
AST: 32 U/L (ref 5–34)
Albumin: 3.4 g/dL — ABNORMAL LOW (ref 3.5–5.0)
Alkaline Phosphatase: 102 U/L (ref 40–150)
BILIRUBIN TOTAL: 0.68 mg/dL (ref 0.20–1.20)
BUN: 3.6 mg/dL — AB (ref 7.0–26.0)
CO2: 25 mEq/L (ref 22–29)
Calcium: 9.1 mg/dL (ref 8.4–10.4)
Chloride: 102 mEq/L (ref 98–109)
Creatinine: 0.7 mg/dL (ref 0.7–1.3)
Glucose: 120 mg/dl (ref 70–140)
Potassium: 4 mEq/L (ref 3.5–5.1)
Sodium: 138 mEq/L (ref 136–145)
Total Protein: 6.5 g/dL (ref 6.4–8.3)

## 2013-06-21 MED ORDER — SODIUM CHLORIDE 0.9 % IV SOLN
150.0000 mg | Freq: Once | INTRAVENOUS | Status: AC
  Administered 2013-06-21: 150 mg via INTRAVENOUS
  Filled 2013-06-21: qty 5

## 2013-06-21 MED ORDER — PALONOSETRON HCL INJECTION 0.25 MG/5ML
0.2500 mg | Freq: Once | INTRAVENOUS | Status: AC
  Administered 2013-06-21: 0.25 mg via INTRAVENOUS

## 2013-06-21 MED ORDER — SODIUM CHLORIDE 0.9 % IV SOLN
30.0000 mg/m2 | Freq: Once | INTRAVENOUS | Status: AC
  Administered 2013-06-21: 70 mg via INTRAVENOUS
  Filled 2013-06-21: qty 70

## 2013-06-21 MED ORDER — ATROPINE SULFATE 1 MG/ML IJ SOLN
0.5000 mg | Freq: Once | INTRAMUSCULAR | Status: AC | PRN
  Administered 2013-06-21: 0.5 mg via INTRAVENOUS

## 2013-06-21 MED ORDER — PALONOSETRON HCL INJECTION 0.25 MG/5ML
INTRAVENOUS | Status: AC
  Filled 2013-06-21: qty 5

## 2013-06-21 MED ORDER — SODIUM CHLORIDE 0.9 % IJ SOLN
10.0000 mL | INTRAMUSCULAR | Status: DC | PRN
  Administered 2013-06-21: 10 mL
  Filled 2013-06-21: qty 10

## 2013-06-21 MED ORDER — POTASSIUM CHLORIDE 2 MEQ/ML IV SOLN
Freq: Once | INTRAVENOUS | Status: AC
  Administered 2013-06-21: 12:00:00 via INTRAVENOUS
  Filled 2013-06-21: qty 10

## 2013-06-21 MED ORDER — ATROPINE SULFATE 1 MG/ML IJ SOLN
INTRAMUSCULAR | Status: AC
  Filled 2013-06-21: qty 1

## 2013-06-21 MED ORDER — HEPARIN SOD (PORK) LOCK FLUSH 100 UNIT/ML IV SOLN
500.0000 [IU] | Freq: Once | INTRAVENOUS | Status: AC | PRN
  Administered 2013-06-21: 500 [IU]
  Filled 2013-06-21: qty 5

## 2013-06-21 MED ORDER — IRINOTECAN HCL CHEMO INJECTION 100 MG/5ML
65.0000 mg/m2 | Freq: Once | INTRAVENOUS | Status: AC
  Administered 2013-06-21: 150 mg via INTRAVENOUS
  Filled 2013-06-21: qty 7.5

## 2013-06-21 MED ORDER — SODIUM CHLORIDE 0.9 % IV SOLN
Freq: Once | INTRAVENOUS | Status: AC
  Administered 2013-06-21: 11:00:00 via INTRAVENOUS

## 2013-06-21 MED ORDER — DEXAMETHASONE SODIUM PHOSPHATE 20 MG/5ML IJ SOLN
INTRAMUSCULAR | Status: AC
  Filled 2013-06-21: qty 5

## 2013-06-21 MED ORDER — DEXAMETHASONE SODIUM PHOSPHATE 20 MG/5ML IJ SOLN
12.0000 mg | Freq: Once | INTRAMUSCULAR | Status: AC
  Administered 2013-06-21: 12 mg via INTRAVENOUS

## 2013-06-21 NOTE — Progress Notes (Signed)
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  MOLECULAR BIOMARKERS: Foundation one: Amplification of PIK3CA, RAF1, CCND1, MYC(equivocal), PRKCl, SOX2, EPHB1, FGF19, FGF3, FGF4, GATA6, TERC and [id number], [id number].  PRIOR THERAPY:  1) status post T8-T10 spinal fusion under the care of Dr. Vertell Limber.  2) status post a stereotactic radiotherapy to the T9 lesion under the care of Dr. [name]. 3) Systemic chemotherapy with EOX, (epirubicin, oxaliplatin and Xeloda) every 3 weeks. First dose on 03/18/2013, status post 3 cycles discontinued today secondary to disease progression.  CURRENT THERAPY: systemic chemotherapy with cisplatin 30 mg/M2 and irinotecan 65 mg/M2 days 1 and 8 every 3 weeks. First cycle expected on 05/31/2013. Status post one cycle.   Advanced directives: The patient does not have advanced directives and he was given information package.  INTERVAL HISTORY: Fred [name] 43 y.o. male returns to the clinic today for followup visit accompanied by his wife. The patient tolerated the first cycle of his systemic chemotherapy with cisplatin and irinotecan fairly well except for few episodes of diarrhea that improved with Imodium. He denied having any significant fever or chills, no nausea or vomiting. He felt much better on the new regimen compared to the previous regimen with EOX. He continues to have mild low back pain but well controlled with his current pain medication. The patient lost a few more pounds since his last visit and he continues to have dysphagia especially to solid food. He is here today to start cycle #2 of his chemotherapy.   MEDICAL HISTORY: Past Medical History  Diagnosis Date  . 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 GERD (gastroesophageal reflux disease)     cannot taste anything  . 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  MEDICATIONS:  Current Outpatient Prescriptions  Medication Sig Dispense Refill  . lidocaine-prilocaine (EMLA) cream Apply 1 application topically as needed. Apply to port 1 hr before chemo  30 g  0  . oxycodone (OXY-IR) 5 MG capsule Take 1 capsule (5 mg total) by mouth every 6 (six) hours as needed for pain (1-2 tablets every 6 hours as needed).  60 capsule  0  . gabapentin (NEURONTIN) 100 MG capsule Take 1 capsule (100 mg total) by mouth 3 (three) times daily.  90 capsule  1  . mirtazapine (REMERON) 30 MG tablet Take 1 tablet (30 mg total) by mouth at bedtime.  30 tablet  2  . morphine (MS CONTIN) 30 MG 12 hr tablet Take a 30 mg tablet EVER 8 HOURS  90 tablet  0  . omeprazole (PRILOSEC) 20 MG capsule Take 20 mg by mouth daily.      . prochlorperazine (COMPAZINE) 10 MG tablet Take 1 tablet (10 mg total) by mouth every 6 (six) hours as needed for nausea or vomiting.  60 tablet  0   No current facility-administered medications for this visit.   Facility-Administered Medications Ordered in Other Visits  Medication Dose Route Frequency Provider Last Rate Last Dose  . sodium chloride 0.9 % injection 10 mL  10 mL Intravenous PRN Curt Bears, MD   10 mL at 04/29/13 1105    REVIEW OF SYSTEMS:  Constitutional: positive for fatigue and weight loss Eyes: negative Ears, nose,  mouth, throat, and face: negative Respiratory: negative Cardiovascular: negative Gastrointestinal: positive for dysphagia Genitourinary:negative Integument/breast: negative Hematologic/lymphatic: negative Musculoskeletal:positive for back pain Neurological: negative Behavioral/Psych: negative Endocrine: negative Allergic/Immunologic: negative   PHYSICAL EXAMINATION: General appearance: alert, cooperative, fatigued and no distress Head: Normocephalic,  without obvious abnormality, atraumatic Neck: no JVD, supple, symmetrical, trachea midline, thyroid not enlarged, symmetric, no tenderness/mass/nodules and Palpable small left supraclavicular lymph node Lymph nodes: Palpable small left supraclavicular lymph node Resp: clear to auscultation bilaterally Back: symmetric, no curvature. ROM normal. No CVA tenderness. Cardio: regular rate and rhythm, S1, S2 normal, no murmur, click, rub or gallop GI: soft, non-tender; bowel sounds normal; no masses,  no organomegaly Extremities: extremities normal, atraumatic, no cyanosis or edema Neurologic: Alert and oriented X 3, normal strength and tone. Normal symmetric reflexes. Normal coordination and gait  ECOG PERFORMANCE STATUS: 1 - Symptomatic but completely ambulatory  Blood pressure 141/80, pulse 123, temperature 97.8 F (36.6 C), temperature source Oral, resp. rate 18, height 6' (1.829 m), weight 220 lb 6.4 oz (99.973 kg), SpO2 100.00%.  LABORATORY DATA: Lab Results  Component Value Date   WBC 3.5* 06/21/2013   HGB 11.6* 06/21/2013   HCT 35.1* 06/21/2013   MCV 86.3 06/21/2013   PLT 332 06/21/2013      Chemistry      Component Value Date/Time   NA 138 06/21/2013 1002   NA 133* 02/14/2013 1505   K 4.0 06/21/2013 1002   K 3.9 02/14/2013 1505   CL 95* 02/11/2013 0954   CO2 25 06/21/2013 1002   CO2 28 02/11/2013 0954   BUN 3.6* 06/21/2013 1002   BUN 16 02/11/2013 0954   CREATININE 0.7 06/21/2013 1002   CREATININE 0.82 02/11/2013 0954      Component Value Date/Time   CALCIUM 9.1 06/21/2013 1002   CALCIUM 9.2 02/11/2013 0954   ALKPHOS 102 06/21/2013 1002   ALKPHOS 118* 02/11/2013 0954   AST 32 06/21/2013 1002   AST 19 02/11/2013 0954   ALT 59* 06/21/2013 1002   ALT 35 02/11/2013 0954   BILITOT 0.68 06/21/2013 1002   BILITOT 1.0 02/11/2013 0954       RADIOGRAPHIC STUDIES:  ASSESSMENT AND PLAN: This is a very pleasant and unfortunate 43 years old white male who is recently diagnosed with widely metastatic distal  esophageal adenocarcinoma. The patient is status post stabilization of his thoracic vertebrae with resection of tumor from that area followed by stereotactic radiotherapy. The patient completed a course of systemic chemotherapy with EOX status post 3 cycles and tolerating it fairly well but unfortunately restaging CT scan of the chest, abdomen and pelvis showed evidence for disease progression with new lesions in the liver. He was started on treatment with cisplatin and irinotecan is status post 1 cycle and tolerating it well except for few episodes of diarrhea. I recommended for the patient to proceed with cycle #2 of his treatment today as scheduled. I would see him back for followup visit in 3 weeks after repeating CT scan of the chest, abdomen and pelvis for restaging of his disease. For the persistent dysphagia, I will refer the patient to Dr. [name] for evaluation and consideration of a short course of palliative radiotherapy to the esophageal mass. For pain management the patient will continue on MS Contin 30 mg every 12 hours in addition to oxycodone for breakthrough pain. He was advised to call immediately if he has any concerning symptoms in the interval. The patient voices understanding of current disease status and treatment  options and is in agreement with the current care plan. All questions were answered. The patient knows to call the clinic with any problems, questions or concerns. We can certainly see the patient much sooner if necessary. I spent 15 minutes of face-to-face counseling with the patient and his wife out of the total visit time 25 minutes.  Disclaimer: This note was dictated with voice recognition software. Similar sounding words can inadvertently be transcribed and may not be corrected upon review.

## 2013-06-21 NOTE — Telephone Encounter (Signed)
gv and printed appt sched and avs for pt fro June/July...sed added tx////gv pt barium

## 2013-06-21 NOTE — Progress Notes (Signed)
Patient output 200 ml before cisplatin. Dr Marin Olp notified. Ok to treat per MD.

## 2013-06-24 ENCOUNTER — Encounter: Payer: Self-pay | Admitting: Radiation Oncology

## 2013-06-24 NOTE — Progress Notes (Signed)
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 Vertell Limber from neurosurgery.  The recent films were presented in our multidisciplinary conference with neuroradiology just prior to the clinic.  He was receiving chemotherapy with EOX, (epirubicin, oxaliplatin and Xeloda) every 3 weeks. First dose on 03/18/2013, status post 3 cycles discontinued secondary to disease progression with new lesions in the liver.  He was then started on treatment with cisplatin and irinotecan is status post 1 cycle and tolerating it well except for few episodes of diarrhea.  For persistent dysphagia, Dr. Julien Nordmann is referring the patient back to me for evaluation and consideration of a short course of palliative radiotherapy to the esophageal mass.  He has back pain requiring prn oxycodone.                            ALLERGIES:  has No Known Allergies.  Meds: Current Outpatient Prescriptions  Medication Sig Dispense Refill  . gabapentin (NEURONTIN) 100 MG capsule Take 1 capsule (100 mg total) by mouth 3 (three) times daily.  90 capsule  1  . lidocaine-prilocaine (EMLA) cream Apply 1 application topically as needed. Apply to port 1 hr before chemo  30 g  0  . mirtazapine (REMERON) 30 MG tablet Take 1 tablet (30 mg total) by mouth at bedtime.  30 tablet  2  . morphine (MS CONTIN) 30 MG 12 hr tablet Take a 30 mg tablet EVER 8 HOURS  90 tablet  0  . omeprazole (PRILOSEC) 20 MG  capsule Take 20 mg by mouth daily.      Marland Kitchen oxycodone (OXY-IR) 5 MG capsule Take 1 capsule (5 mg total) by mouth every 6 (six) hours as needed for pain (1-2 tablets every 6 hours as needed).  60 capsule  0  . prochlorperazine (COMPAZINE) 10 MG tablet Take 1 tablet (10 mg total) by mouth every 6 (six) hours as needed for nausea or vomiting.  60 tablet  0   No current facility-administered medications for this encounter.   Facility-Administered Medications Ordered in Other Encounters  Medication Dose Route Frequency Temia Debroux Last Rate Last Dose  . sodium chloride 0.9 % injection 10 mL  10 mL Intravenous PRN Curt Bears, MD   10 mL at 04/29/13 1105    Physical Findings: The patient is in no acute distress. Patient is alert and oriented. weight 219.  Motor stregnth intact.             No significant changes.  Lab Findings: Lab Results  Component Value Date   WBC 3.5* 06/21/2013   HGB 11.6* 06/21/2013   HCT 35.1* 06/21/2013   MCV 86.3 06/21/2013   PLT 332 06/21/2013   Radiographic Findings: CT from 5/18 reviewed.  Impression:  The patient is recovering from the effects of radiation.  He does report some dysphagia from his primary tumor.  Plan:  Today, I talked  to the patient and family about the dysphagia from his esophagus and general treatment, highlighting the role or radiotherapy in the management.  We discussed the available radiation techniques, and focused on the details of logistics and delivery.  We reviewed the anticipated acute and late sequelae associated with radiation in this setting.  The patient was encouraged to ask questions that I answered to the best of my ability.  The patient would like to proceed with radiation and will be scheduled for CT simulation.  _____________________________________  Sheral Apley [name], M.D. and  Erline Levine, M.D.

## 2013-06-25 ENCOUNTER — Encounter: Payer: Self-pay | Admitting: Internal Medicine

## 2013-06-25 ENCOUNTER — Encounter: Payer: BC Managed Care – PPO | Admitting: Physical Therapy

## 2013-06-25 NOTE — Progress Notes (Signed)
Put fmla form on nurse's desk °

## 2013-06-26 ENCOUNTER — Other Ambulatory Visit: Payer: BC Managed Care – PPO

## 2013-06-26 ENCOUNTER — Encounter: Payer: Self-pay | Admitting: Internal Medicine

## 2013-06-26 ENCOUNTER — Ambulatory Visit
Admission: RE | Admit: 2013-06-26 | Discharge: 2013-06-26 | Disposition: A | Discharge status: Home / Self Care | Payer: BC Managed Care – PPO | Source: Ambulatory Visit | Attending: Radiation Oncology | Admitting: Radiation Oncology

## 2013-06-26 DIAGNOSIS — C7931 Secondary malignant neoplasm of brain: Secondary | ICD-10-CM

## 2013-06-26 DIAGNOSIS — C7949 Secondary malignant neoplasm of other parts of nervous system: Principal | ICD-10-CM

## 2013-06-26 NOTE — Progress Notes (Signed)
Faxed fmla form to [employer] @ [phone]

## 2013-06-27 ENCOUNTER — Ambulatory Visit
Admission: RE | Admit: 2013-06-27 | Discharge: 2013-06-27 | Disposition: A | Discharge status: Home / Self Care | Payer: BC Managed Care – PPO | Source: Ambulatory Visit | Attending: Radiation Oncology | Admitting: Radiation Oncology

## 2013-06-27 ENCOUNTER — Encounter: Payer: Self-pay | Admitting: Radiation Oncology

## 2013-06-27 ENCOUNTER — Encounter: Payer: BC Managed Care – PPO | Admitting: Physical Therapy

## 2013-06-27 DIAGNOSIS — R059 Cough, unspecified: Secondary | ICD-10-CM | POA: Insufficient documentation

## 2013-06-27 DIAGNOSIS — R000 Tachycardia, unspecified: Secondary | ICD-10-CM | POA: Insufficient documentation

## 2013-06-27 DIAGNOSIS — C159 Malignant neoplasm of esophagus, unspecified: Secondary | ICD-10-CM | POA: Diagnosis not present

## 2013-06-27 DIAGNOSIS — R609 Edema, unspecified: Secondary | ICD-10-CM | POA: Diagnosis not present

## 2013-06-27 DIAGNOSIS — Z7901 Long term (current) use of anticoagulants: Secondary | ICD-10-CM | POA: Diagnosis not present

## 2013-06-27 DIAGNOSIS — Z510 Encounter for antineoplastic radiation therapy: Secondary | ICD-10-CM | POA: Insufficient documentation

## 2013-06-27 DIAGNOSIS — Z79899 Other long term (current) drug therapy: Secondary | ICD-10-CM | POA: Diagnosis not present

## 2013-06-27 DIAGNOSIS — R1310 Dysphagia, unspecified: Secondary | ICD-10-CM | POA: Diagnosis not present

## 2013-06-27 DIAGNOSIS — R05 Cough: Secondary | ICD-10-CM | POA: Insufficient documentation

## 2013-06-27 DIAGNOSIS — R0602 Shortness of breath: Secondary | ICD-10-CM | POA: Insufficient documentation

## 2013-06-27 NOTE — Progress Notes (Signed)
  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  Identity was confirmed.  All relevant records and images related to the planned course of therapy were reviewed.  The patient freely provided informed written consent to proceed with treatment after reviewing the details related to the planned course of therapy. The consent form was witnessed and verified by the simulation staff.  Then, the patient was set-up in a stable reproducible  supine position for radiation therapy.  CT images were obtained.  Surface markings were placed.  The CT images were loaded into the planning software.  Then the target and avoidance structures were contoured.  Treatment planning then occurred.  The radiation prescription was entered and confirmed.  Then, I designed and supervised the construction of a total of 5 medically necessary complex treatment devices.  I have requested : 3D Simulation  I have requested a DVH of the following structures: heart, lungs, spinal cord, and target.    Special Treatment Procedure  This treatment constitutes a Special Treatment Procedure for the following reason: [ Retreatment in a previously radiated area requiring careful monitoring of increased risk of toxicity due to overlap of previous treatment.   Will request Nutrition Consult  PLAN:  The patient will receive 35 Gy in 14 fractions.  ________________________________  Sheral Apley [name], M.D.

## 2013-06-28 ENCOUNTER — Ambulatory Visit (HOSPITAL_BASED_OUTPATIENT_CLINIC_OR_DEPARTMENT_OTHER): Payer: BC Managed Care – PPO

## 2013-06-28 ENCOUNTER — Other Ambulatory Visit (HOSPITAL_BASED_OUTPATIENT_CLINIC_OR_DEPARTMENT_OTHER): Payer: BC Managed Care – PPO

## 2013-06-28 ENCOUNTER — Telehealth: Payer: Self-pay | Admitting: *Deleted

## 2013-06-28 VITALS — BP 113/87 | HR 125 | Temp 97.4°F

## 2013-06-28 DIAGNOSIS — C155 Malignant neoplasm of lower third of esophagus: Secondary | ICD-10-CM

## 2013-06-28 DIAGNOSIS — C7952 Secondary malignant neoplasm of bone marrow: Secondary | ICD-10-CM

## 2013-06-28 DIAGNOSIS — Z5111 Encounter for antineoplastic chemotherapy: Secondary | ICD-10-CM

## 2013-06-28 DIAGNOSIS — C159 Malignant neoplasm of esophagus, unspecified: Secondary | ICD-10-CM

## 2013-06-28 DIAGNOSIS — C7951 Secondary malignant neoplasm of bone: Secondary | ICD-10-CM

## 2013-06-28 LAB — COMPREHENSIVE METABOLIC PANEL (CC13)
ALT: 56 U/L — AB (ref 0–55)
ANION GAP: 12 meq/L — AB (ref 3–11)
AST: 46 U/L — ABNORMAL HIGH (ref 5–34)
Albumin: 3.5 g/dL (ref 3.5–5.0)
Alkaline Phosphatase: 104 U/L (ref 40–150)
BILIRUBIN TOTAL: 0.54 mg/dL (ref 0.20–1.20)
BUN: 4.5 mg/dL — ABNORMAL LOW (ref 7.0–26.0)
CHLORIDE: 98 meq/L (ref 98–109)
CO2: 26 meq/L (ref 22–29)
Calcium: 9.2 mg/dL (ref 8.4–10.4)
Creatinine: 0.8 mg/dL (ref 0.7–1.3)
Glucose: 138 mg/dl (ref 70–140)
Potassium: 3.9 mEq/L (ref 3.5–5.1)
SODIUM: 135 meq/L — AB (ref 136–145)
TOTAL PROTEIN: 6.7 g/dL (ref 6.4–8.3)

## 2013-06-28 LAB — CBC WITH DIFFERENTIAL/PLATELET
BASO%: 1.1 % (ref 0.0–2.0)
Basophils Absolute: 0 10*3/uL (ref 0.0–0.1)
EOS%: 1.2 % (ref 0.0–7.0)
Eosinophils Absolute: 0 10*3/uL (ref 0.0–0.5)
HCT: 34 % — ABNORMAL LOW (ref 38.4–49.9)
HGB: 11.4 g/dL — ABNORMAL LOW (ref 13.0–17.1)
LYMPH#: 0.9 10*3/uL (ref 0.9–3.3)
LYMPH%: 30.2 % (ref 14.0–49.0)
MCH: 29 pg (ref 27.2–33.4)
MCHC: 33.5 g/dL (ref 32.0–36.0)
MCV: 86.5 fL (ref 79.3–98.0)
MONO#: 0.6 10*3/uL (ref 0.1–0.9)
MONO%: 19.1 % — ABNORMAL HIGH (ref 0.0–14.0)
NEUT#: 1.5 10*3/uL (ref 1.5–6.5)
NEUT%: 48.4 % (ref 39.0–75.0)
Platelets: 257 10*3/uL (ref 140–400)
RBC: 3.94 10*6/uL — AB (ref 4.20–5.82)
RDW: 17.4 % — ABNORMAL HIGH (ref 11.0–14.6)
WBC: 3 10*3/uL — ABNORMAL LOW (ref 4.0–10.3)

## 2013-06-28 MED ORDER — SODIUM CHLORIDE 0.9 % IV SOLN
150.0000 mg | Freq: Once | INTRAVENOUS | Status: AC
  Administered 2013-06-28: 150 mg via INTRAVENOUS
  Filled 2013-06-28: qty 5

## 2013-06-28 MED ORDER — SODIUM CHLORIDE 0.9 % IJ SOLN
10.0000 mL | INTRAMUSCULAR | Status: DC | PRN
  Administered 2013-06-28: 10 mL
  Filled 2013-06-28: qty 10

## 2013-06-28 MED ORDER — IRINOTECAN HCL CHEMO INJECTION 100 MG/5ML
65.0000 mg/m2 | Freq: Once | INTRAVENOUS | Status: AC
  Administered 2013-06-28: 150 mg via INTRAVENOUS
  Filled 2013-06-28: qty 7.5

## 2013-06-28 MED ORDER — HEPARIN SOD (PORK) LOCK FLUSH 100 UNIT/ML IV SOLN
500.0000 [IU] | Freq: Once | INTRAVENOUS | Status: AC | PRN
  Administered 2013-06-28: 500 [IU]
  Filled 2013-06-28: qty 5

## 2013-06-28 MED ORDER — PALONOSETRON HCL INJECTION 0.25 MG/5ML
INTRAVENOUS | Status: AC
  Filled 2013-06-28: qty 5

## 2013-06-28 MED ORDER — DEXAMETHASONE SODIUM PHOSPHATE 20 MG/5ML IJ SOLN
12.0000 mg | Freq: Once | INTRAMUSCULAR | Status: AC
  Administered 2013-06-28: 12 mg via INTRAVENOUS

## 2013-06-28 MED ORDER — ATROPINE SULFATE 1 MG/ML IJ SOLN
0.5000 mg | Freq: Once | INTRAMUSCULAR | Status: AC | PRN
  Administered 2013-06-28: 0.5 mg via INTRAVENOUS

## 2013-06-28 MED ORDER — PALONOSETRON HCL INJECTION 0.25 MG/5ML
0.2500 mg | Freq: Once | INTRAVENOUS | Status: AC
  Administered 2013-06-28: 0.25 mg via INTRAVENOUS

## 2013-06-28 MED ORDER — ATROPINE SULFATE 1 MG/ML IJ SOLN
INTRAMUSCULAR | Status: AC
  Filled 2013-06-28: qty 1

## 2013-06-28 MED ORDER — SODIUM CHLORIDE 0.9 % IV SOLN
30.0000 mg/m2 | Freq: Once | INTRAVENOUS | Status: AC
  Administered 2013-06-28: 70 mg via INTRAVENOUS
  Filled 2013-06-28: qty 70

## 2013-06-28 MED ORDER — POTASSIUM CHLORIDE 2 MEQ/ML IV SOLN
Freq: Once | INTRAVENOUS | Status: AC
  Administered 2013-06-28: 11:00:00 via INTRAVENOUS
  Filled 2013-06-28: qty 10

## 2013-06-28 MED ORDER — DEXAMETHASONE SODIUM PHOSPHATE 20 MG/5ML IJ SOLN
INTRAMUSCULAR | Status: AC
  Filled 2013-06-28: qty 5

## 2013-06-28 MED ORDER — SODIUM CHLORIDE 0.9 % IV SOLN
Freq: Once | INTRAVENOUS | Status: AC
  Administered 2013-06-28: 11:00:00 via INTRAVENOUS

## 2013-06-28 NOTE — Telephone Encounter (Signed)
Medical record release paperwork signed by patient to release info to [place] given to [employer] to review.  SLJ

## 2013-06-28 NOTE — Patient Instructions (Signed)
[facility] Discharge Instructions for Patients Receiving Chemotherapy  Today you received the following chemotherapy agents Irinotecan/Cisplatin.  To help prevent nausea and vomiting after your treatment, we encourage you to take your nausea medication as prescribed.   If you develop nausea and vomiting that is not controlled by your nausea medication, call the clinic.   BELOW ARE SYMPTOMS THAT SHOULD BE REPORTED IMMEDIATELY:  *FEVER GREATER THAN 100.5 F  *CHILLS WITH OR WITHOUT FEVER  NAUSEA AND VOMITING THAT IS NOT CONTROLLED WITH YOUR NAUSEA MEDICATION  *UNUSUAL SHORTNESS OF BREATH  *UNUSUAL BRUISING OR BLEEDING  TENDERNESS IN MOUTH AND THROAT WITH OR WITHOUT PRESENCE OF ULCERS  *URINARY PROBLEMS  *BOWEL PROBLEMS  UNUSUAL RASH Items with * indicate a potential emergency and should be followed up as soon as possible.  Feel free to call the clinic you have any questions or concerns. The clinic phone number is [phone].

## 2013-07-02 ENCOUNTER — Ambulatory Visit
Admission: RE | Admit: 2013-07-02 | Discharge: 2013-07-02 | Disposition: A | Discharge status: Home / Self Care | Payer: BC Managed Care – PPO | Source: Ambulatory Visit | Attending: Radiation Oncology | Admitting: Radiation Oncology

## 2013-07-02 ENCOUNTER — Encounter: Payer: Self-pay | Admitting: Radiation Oncology

## 2013-07-02 DIAGNOSIS — Z510 Encounter for antineoplastic radiation therapy: Secondary | ICD-10-CM | POA: Diagnosis not present

## 2013-07-02 NOTE — Progress Notes (Signed)
  Radiation Oncology         [phone] ________________________________  Name: Fred [name] MRN: [id number]  Date: 07/02/2013  DOB: [birth year]  Simulation Verification Note  Status: outpatient  NARRATIVE: The patient was brought to the treatment unit and placed in the planned treatment position. The clinical setup was verified. Then port films were obtained and uploaded to the radiation oncology medical record software.  The treatment beams were carefully compared against the planned radiation fields. The position location and shape of the radiation fields was reviewed. The targeted volume of tissue appears appropriately covered by the radiation beams. Organs at risk appear to be excluded as planned.  Based on my personal review, I approved the simulation verification. The patient's treatment will proceed as planned.  ------------------------------------------------  Thea Silversmith, MD

## 2013-07-03 ENCOUNTER — Ambulatory Visit
Admission: RE | Admit: 2013-07-03 | Discharge: 2013-07-03 | Disposition: A | Discharge status: Home / Self Care | Payer: BC Managed Care – PPO | Source: Ambulatory Visit | Attending: Radiation Oncology | Admitting: Radiation Oncology

## 2013-07-03 DIAGNOSIS — Z510 Encounter for antineoplastic radiation therapy: Secondary | ICD-10-CM | POA: Diagnosis not present

## 2013-07-04 ENCOUNTER — Ambulatory Visit
Admission: RE | Admit: 2013-07-04 | Discharge: 2013-07-04 | Disposition: A | Discharge status: Home / Self Care | Payer: BC Managed Care – PPO | Source: Ambulatory Visit | Attending: Radiation Oncology | Admitting: Radiation Oncology

## 2013-07-04 ENCOUNTER — Encounter: Payer: BC Managed Care – PPO | Admitting: Physical Therapy

## 2013-07-04 ENCOUNTER — Other Ambulatory Visit (HOSPITAL_BASED_OUTPATIENT_CLINIC_OR_DEPARTMENT_OTHER): Payer: BC Managed Care – PPO

## 2013-07-04 ENCOUNTER — Encounter: Payer: Self-pay | Admitting: Radiation Oncology

## 2013-07-04 VITALS — BP 132/89 | HR 131 | Resp 16 | Wt 219.6 lb

## 2013-07-04 DIAGNOSIS — C7951 Secondary malignant neoplasm of bone: Secondary | ICD-10-CM

## 2013-07-04 DIAGNOSIS — C7952 Secondary malignant neoplasm of bone marrow: Secondary | ICD-10-CM

## 2013-07-04 DIAGNOSIS — C155 Malignant neoplasm of lower third of esophagus: Secondary | ICD-10-CM

## 2013-07-04 DIAGNOSIS — C159 Malignant neoplasm of esophagus, unspecified: Secondary | ICD-10-CM

## 2013-07-04 DIAGNOSIS — Z510 Encounter for antineoplastic radiation therapy: Secondary | ICD-10-CM | POA: Diagnosis not present

## 2013-07-04 LAB — CBC WITH DIFFERENTIAL/PLATELET
BASO%: 0.3 % (ref 0.0–2.0)
BASOS ABS: 0 10*3/uL (ref 0.0–0.1)
EOS%: 0.3 % (ref 0.0–7.0)
Eosinophils Absolute: 0 10*3/uL (ref 0.0–0.5)
HEMATOCRIT: 29.7 % — AB (ref 38.4–49.9)
HGB: 10 g/dL — ABNORMAL LOW (ref 13.0–17.1)
LYMPH%: 10.7 % — AB (ref 14.0–49.0)
MCH: 29.2 pg (ref 27.2–33.4)
MCHC: 33.7 g/dL (ref 32.0–36.0)
MCV: 86.7 fL (ref 79.3–98.0)
MONO#: 0.4 10*3/uL (ref 0.1–0.9)
MONO%: 4.6 % (ref 0.0–14.0)
NEUT#: 7.5 10*3/uL — ABNORMAL HIGH (ref 1.5–6.5)
NEUT%: 84.1 % — AB (ref 39.0–75.0)
PLATELETS: 224 10*3/uL (ref 140–400)
RBC: 3.43 10*6/uL — ABNORMAL LOW (ref 4.20–5.82)
RDW: 18 % — ABNORMAL HIGH (ref 11.0–14.6)
WBC: 8.9 10*3/uL (ref 4.0–10.3)
lymph#: 1 10*3/uL (ref 0.9–3.3)

## 2013-07-04 LAB — COMPREHENSIVE METABOLIC PANEL (CC13)
ALBUMIN: 3.3 g/dL — AB (ref 3.5–5.0)
ALK PHOS: 95 U/L (ref 40–150)
ALT: 43 U/L (ref 0–55)
AST: 36 U/L — ABNORMAL HIGH (ref 5–34)
Anion Gap: 14 mEq/L — ABNORMAL HIGH (ref 3–11)
BILIRUBIN TOTAL: 0.56 mg/dL (ref 0.20–1.20)
BUN: 7.9 mg/dL (ref 7.0–26.0)
CO2: 22 mEq/L (ref 22–29)
CREATININE: 0.8 mg/dL (ref 0.7–1.3)
Calcium: 8.8 mg/dL (ref 8.4–10.4)
Chloride: 97 mEq/L — ABNORMAL LOW (ref 98–109)
GLUCOSE: 141 mg/dL — AB (ref 70–140)
Potassium: 3.8 mEq/L (ref 3.5–5.1)
Sodium: 133 mEq/L — ABNORMAL LOW (ref 136–145)
Total Protein: 6.7 g/dL (ref 6.4–8.3)

## 2013-07-04 NOTE — Progress Notes (Signed)
   Department of Radiation Oncology  Phone:  [phone] Fax:        [phone]  Weekly Treatment Note    Name: Fred [name] Date: 07/04/2013 MRN: [id number] DOB: August 15, 1970   Current dose: 5 Gy  Current fraction: 2   MEDICATIONS: Current Outpatient Prescriptions  Medication Sig Dispense Refill  . gabapentin (NEURONTIN) 100 MG capsule Take 1 capsule (100 mg total) by mouth 3 (three) times daily.  90 capsule  1  . lidocaine-prilocaine (EMLA) cream Apply 1 application topically as needed. Apply to port 1 hr before chemo  30 g  0  . mirtazapine (REMERON) 30 MG tablet Take 1 tablet (30 mg total) by mouth at bedtime.  30 tablet  2  . morphine (MS CONTIN) 30 MG 12 hr tablet Take a 30 mg tablet EVER 8 HOURS  90 tablet  0  . omeprazole (PRILOSEC) 20 MG capsule Take 20 mg by mouth daily.      Marland Kitchen oxycodone (OXY-IR) 5 MG capsule Take 1 capsule (5 mg total) by mouth every 6 (six) hours as needed for pain (1-2 tablets every 6 hours as needed).  60 capsule  0  . prochlorperazine (COMPAZINE) 10 MG tablet Take 1 tablet (10 mg total) by mouth every 6 (six) hours as needed for nausea or vomiting.  60 tablet  0   No current facility-administered medications for this encounter.   Facility-Administered Medications Ordered in Other Encounters  Medication Dose Route Frequency Provider Last Rate Last Dose  . sodium chloride 0.9 % injection 10 mL  10 mL Intravenous PRN Curt Bears, MD   10 mL at 04/29/13 1105     ALLERGIES: Review of patient's allergies indicates no known allergies.   LABORATORY DATA:  Lab Results  Component Value Date   WBC 3.0* 06/28/2013   HGB 11.4* 06/28/2013   HCT 34.0* 06/28/2013   MCV 86.5 06/28/2013   PLT 257 06/28/2013   Lab Results  Component Value Date   NA 135* 06/28/2013   K 3.9 06/28/2013   CL 95* 02/11/2013   CO2 26 06/28/2013   Lab Results  Component Value Date   ALT 56* 06/28/2013   AST 46* 06/28/2013   ALKPHOS 104 06/28/2013   BILITOT 0.54 06/28/2013      NARRATIVE: Fred [name] was seen today for weekly treatment management. The chart was checked and the patient's films were reviewed. The patient states that he has been feeling fine with treatment so far. He received his second fraction today. He had a pre-spell of nausea but this has not been a major issue for him. No change in swallowing. He continues to be able to swallow liquids without difficulty.  PHYSICAL EXAMINATION: weight is 219 lb 9.6 oz (99.61 kg). His blood pressure is 132/89 and his pulse is 131. His respiration is 16.        ASSESSMENT: The patient is doing satisfactorily with treatment.  PLAN: We will continue with the patient's radiation treatment as planned. The patient was encouraged to drink a lot of fluid over the weekend.

## 2013-07-04 NOTE — Progress Notes (Signed)
Slow steady decline of weight noted. Heart rate elevated. Reports nausea following treatment but, denies he vomited. Reports eating mostly a liquid diet. Reports difficulty swallowing continues but, isn't worse than prior to starting treatment. Reports shortness of breath with exertion. Denies sores or ulcerations of the mouth. Denies headache, dizziness, or diarrhea.

## 2013-07-08 ENCOUNTER — Ambulatory Visit
Admission: RE | Admit: 2013-07-08 | Discharge: 2013-07-08 | Disposition: A | Discharge status: Home / Self Care | Payer: BC Managed Care – PPO | Source: Ambulatory Visit | Attending: Radiation Oncology | Admitting: Radiation Oncology

## 2013-07-08 DIAGNOSIS — Z510 Encounter for antineoplastic radiation therapy: Secondary | ICD-10-CM | POA: Diagnosis not present

## 2013-07-09 ENCOUNTER — Ambulatory Visit: Payer: BC Managed Care – PPO | Attending: Internal Medicine | Admitting: Physical Therapy

## 2013-07-09 ENCOUNTER — Ambulatory Visit
Admission: RE | Admit: 2013-07-09 | Discharge: 2013-07-09 | Disposition: A | Discharge status: Home / Self Care | Payer: BC Managed Care – PPO | Source: Ambulatory Visit | Attending: Radiation Oncology | Admitting: Radiation Oncology

## 2013-07-09 DIAGNOSIS — M549 Dorsalgia, unspecified: Secondary | ICD-10-CM | POA: Insufficient documentation

## 2013-07-09 DIAGNOSIS — C159 Malignant neoplasm of esophagus, unspecified: Secondary | ICD-10-CM | POA: Insufficient documentation

## 2013-07-09 DIAGNOSIS — Z510 Encounter for antineoplastic radiation therapy: Secondary | ICD-10-CM | POA: Diagnosis not present

## 2013-07-09 DIAGNOSIS — IMO0001 Reserved for inherently not codable concepts without codable children: Secondary | ICD-10-CM | POA: Insufficient documentation

## 2013-07-10 ENCOUNTER — Ambulatory Visit
Admission: RE | Admit: 2013-07-10 | Discharge: 2013-07-10 | Disposition: A | Discharge status: Home / Self Care | Payer: BC Managed Care – PPO | Source: Ambulatory Visit | Attending: Radiation Oncology | Admitting: Radiation Oncology

## 2013-07-10 DIAGNOSIS — Z510 Encounter for antineoplastic radiation therapy: Secondary | ICD-10-CM | POA: Diagnosis not present

## 2013-07-11 ENCOUNTER — Inpatient Hospital Stay (HOSPITAL_COMMUNITY)
Admission: AD | Admit: 2013-07-11 | Discharge: 2013-07-14 | DRG: 375 | Disposition: A | Discharge status: Home / Self Care | Payer: BC Managed Care – PPO | Source: Ambulatory Visit | Attending: Internal Medicine | Admitting: Internal Medicine

## 2013-07-11 ENCOUNTER — Encounter (HOSPITAL_COMMUNITY): Payer: Self-pay

## 2013-07-11 ENCOUNTER — Ambulatory Visit (HOSPITAL_BASED_OUTPATIENT_CLINIC_OR_DEPARTMENT_OTHER): Payer: BC Managed Care – PPO

## 2013-07-11 ENCOUNTER — Ambulatory Visit
Admission: RE | Admit: 2013-07-11 | Discharge: 2013-07-11 | Disposition: A | Discharge status: Home / Self Care | Payer: BC Managed Care – PPO | Source: Ambulatory Visit | Attending: Radiation Oncology | Admitting: Radiation Oncology

## 2013-07-11 ENCOUNTER — Other Ambulatory Visit: Payer: Self-pay

## 2013-07-11 ENCOUNTER — Other Ambulatory Visit: Payer: Self-pay | Admitting: Physician Assistant

## 2013-07-11 ENCOUNTER — Telehealth: Payer: Self-pay | Admitting: *Deleted

## 2013-07-11 ENCOUNTER — Ambulatory Visit (HOSPITAL_BASED_OUTPATIENT_CLINIC_OR_DEPARTMENT_OTHER): Payer: BC Managed Care – PPO | Admitting: Nurse Practitioner

## 2013-07-11 ENCOUNTER — Ambulatory Visit (HOSPITAL_COMMUNITY)
Admission: RE | Admit: 2013-07-11 | Discharge: 2013-07-11 | Disposition: A | Discharge status: Home / Self Care | Payer: BC Managed Care – PPO | Source: Ambulatory Visit | Attending: Internal Medicine | Admitting: Internal Medicine

## 2013-07-11 ENCOUNTER — Encounter: Payer: BC Managed Care – PPO | Admitting: Physical Therapy

## 2013-07-11 VITALS — BP 134/96 | HR 140 | Temp 98.2°F | Resp 20 | Ht 72.0 in | Wt 223.2 lb

## 2013-07-11 DIAGNOSIS — C7951 Secondary malignant neoplasm of bone: Secondary | ICD-10-CM

## 2013-07-11 DIAGNOSIS — C159 Malignant neoplasm of esophagus, unspecified: Secondary | ICD-10-CM | POA: Diagnosis not present

## 2013-07-11 DIAGNOSIS — Z981 Arthrodesis status: Secondary | ICD-10-CM | POA: Diagnosis not present

## 2013-07-11 DIAGNOSIS — E860 Dehydration: Secondary | ICD-10-CM

## 2013-07-11 DIAGNOSIS — R0789 Other chest pain: Secondary | ICD-10-CM

## 2013-07-11 DIAGNOSIS — Z79899 Other long term (current) drug therapy: Secondary | ICD-10-CM | POA: Diagnosis not present

## 2013-07-11 DIAGNOSIS — R000 Tachycardia, unspecified: Secondary | ICD-10-CM | POA: Diagnosis not present

## 2013-07-11 DIAGNOSIS — N135 Crossing vessel and stricture of ureter without hydronephrosis: Secondary | ICD-10-CM | POA: Diagnosis present

## 2013-07-11 DIAGNOSIS — C7949 Secondary malignant neoplasm of other parts of nervous system: Secondary | ICD-10-CM

## 2013-07-11 DIAGNOSIS — R0602 Shortness of breath: Secondary | ICD-10-CM

## 2013-07-11 DIAGNOSIS — J9819 Other pulmonary collapse: Secondary | ICD-10-CM | POA: Diagnosis present

## 2013-07-11 DIAGNOSIS — E46 Unspecified protein-calorie malnutrition: Secondary | ICD-10-CM | POA: Diagnosis present

## 2013-07-11 DIAGNOSIS — C786 Secondary malignant neoplasm of retroperitoneum and peritoneum: Secondary | ICD-10-CM | POA: Diagnosis present

## 2013-07-11 DIAGNOSIS — R630 Anorexia: Secondary | ICD-10-CM

## 2013-07-11 DIAGNOSIS — J910 Malignant pleural effusion: Secondary | ICD-10-CM | POA: Diagnosis present

## 2013-07-11 DIAGNOSIS — C7952 Secondary malignant neoplasm of bone marrow: Secondary | ICD-10-CM

## 2013-07-11 DIAGNOSIS — C155 Malignant neoplasm of lower third of esophagus: Secondary | ICD-10-CM

## 2013-07-11 DIAGNOSIS — C7931 Secondary malignant neoplasm of brain: Secondary | ICD-10-CM

## 2013-07-11 DIAGNOSIS — T451X5A Adverse effect of antineoplastic and immunosuppressive drugs, initial encounter: Secondary | ICD-10-CM | POA: Diagnosis present

## 2013-07-11 DIAGNOSIS — R05 Cough: Secondary | ICD-10-CM | POA: Diagnosis not present

## 2013-07-11 DIAGNOSIS — Z6830 Body mass index (BMI) 30.0-30.9, adult: Secondary | ICD-10-CM | POA: Diagnosis not present

## 2013-07-11 DIAGNOSIS — R0989 Other specified symptoms and signs involving the circulatory and respiratory systems: Secondary | ICD-10-CM | POA: Diagnosis present

## 2013-07-11 DIAGNOSIS — R1310 Dysphagia, unspecified: Secondary | ICD-10-CM

## 2013-07-11 DIAGNOSIS — E8809 Other disorders of plasma-protein metabolism, not elsewhere classified: Secondary | ICD-10-CM

## 2013-07-11 DIAGNOSIS — I10 Essential (primary) hypertension: Secondary | ICD-10-CM | POA: Diagnosis present

## 2013-07-11 DIAGNOSIS — D649 Anemia, unspecified: Secondary | ICD-10-CM

## 2013-07-11 DIAGNOSIS — R609 Edema, unspecified: Secondary | ICD-10-CM | POA: Diagnosis not present

## 2013-07-11 DIAGNOSIS — Z510 Encounter for antineoplastic radiation therapy: Secondary | ICD-10-CM | POA: Diagnosis present

## 2013-07-11 DIAGNOSIS — E43 Unspecified severe protein-calorie malnutrition: Secondary | ICD-10-CM | POA: Diagnosis present

## 2013-07-11 DIAGNOSIS — D6481 Anemia due to antineoplastic chemotherapy: Secondary | ICD-10-CM | POA: Diagnosis present

## 2013-07-11 DIAGNOSIS — C160 Malignant neoplasm of cardia: Secondary | ICD-10-CM | POA: Diagnosis present

## 2013-07-11 DIAGNOSIS — K219 Gastro-esophageal reflux disease without esophagitis: Secondary | ICD-10-CM | POA: Diagnosis present

## 2013-07-11 DIAGNOSIS — E871 Hypo-osmolality and hyponatremia: Secondary | ICD-10-CM | POA: Diagnosis present

## 2013-07-11 DIAGNOSIS — R059 Cough, unspecified: Secondary | ICD-10-CM | POA: Diagnosis not present

## 2013-07-11 DIAGNOSIS — Z7901 Long term (current) use of anticoagulants: Secondary | ICD-10-CM | POA: Diagnosis not present

## 2013-07-11 DIAGNOSIS — C787 Secondary malignant neoplasm of liver and intrahepatic bile duct: Secondary | ICD-10-CM

## 2013-07-11 DIAGNOSIS — R0609 Other forms of dyspnea: Secondary | ICD-10-CM | POA: Diagnosis present

## 2013-07-11 LAB — CBC WITH DIFFERENTIAL/PLATELET
BASO%: 0.4 % (ref 0.0–2.0)
Basophils Absolute: 0 10*3/uL (ref 0.0–0.1)
Basophils Absolute: 0 10*3/uL (ref 0.0–0.1)
Basophils Relative: 0 % (ref 0–1)
EOS ABS: 0.1 10*3/uL (ref 0.0–0.7)
EOS PCT: 4 % (ref 0–5)
EOS%: 2.9 % (ref 0.0–7.0)
Eosinophils Absolute: 0.1 10*3/uL (ref 0.0–0.5)
HCT: 27 % — ABNORMAL LOW (ref 38.4–49.9)
HCT: 25.5 % — ABNORMAL LOW (ref 39.0–52.0)
HEMOGLOBIN: 8.9 g/dL — AB (ref 13.0–17.0)
HGB: 9 g/dL — ABNORMAL LOW (ref 13.0–17.1)
LYMPH%: 15.2 % (ref 14.0–49.0)
Lymphocytes Relative: 20 % (ref 12–46)
Lymphs Abs: 0.5 10*3/uL — ABNORMAL LOW (ref 0.7–4.0)
MCH: 29.7 pg (ref 27.2–33.4)
MCH: 29.8 pg (ref 26.0–34.0)
MCHC: 33.3 g/dL (ref 32.0–36.0)
MCHC: 34.9 g/dL (ref 30.0–36.0)
MCV: 89.3 fL (ref 79.3–98.0)
MCV: 85.3 fL (ref 78.0–100.0)
MONO#: 0.6 10*3/uL (ref 0.1–0.9)
MONO%: 16.9 % — ABNORMAL HIGH (ref 0.0–14.0)
MONOS PCT: 20 % — AB (ref 3–12)
Monocytes Absolute: 0.5 10*3/uL (ref 0.1–1.0)
NEUT#: 2.3 10*3/uL (ref 1.5–6.5)
NEUT%: 64.6 % (ref 39.0–75.0)
Neutro Abs: 1.4 10*3/uL — ABNORMAL LOW (ref 1.7–7.7)
Neutrophils Relative %: 56 % (ref 43–77)
PLATELETS: 238 10*3/uL (ref 140–400)
Platelets: 170 10*3/uL (ref 150–400)
RBC: 3.02 10*6/uL — ABNORMAL LOW (ref 4.20–5.82)
RBC: 2.99 MIL/uL — AB (ref 4.22–5.81)
RDW: 20.7 % — ABNORMAL HIGH (ref 11.0–14.6)
RDW: 18.9 % — ABNORMAL HIGH (ref 11.5–15.5)
WBC: 3.6 10*3/uL — ABNORMAL LOW (ref 4.0–10.3)
WBC: 2.4 10*3/uL — ABNORMAL LOW (ref 4.0–10.5)
lymph#: 0.5 10*3/uL — ABNORMAL LOW (ref 0.9–3.3)

## 2013-07-11 LAB — PRO B NATRIURETIC PEPTIDE: Pro B Natriuretic peptide (BNP): 111.5 pg/mL (ref 0–125)

## 2013-07-11 LAB — COMPREHENSIVE METABOLIC PANEL (CC13)
ALT: 20 U/L (ref 0–55)
ANION GAP: 12 meq/L — AB (ref 3–11)
AST: 27 U/L (ref 5–34)
Albumin: 3 g/dL — ABNORMAL LOW (ref 3.5–5.0)
Alkaline Phosphatase: 93 U/L (ref 40–150)
BILIRUBIN TOTAL: 0.75 mg/dL (ref 0.20–1.20)
BUN: 7.5 mg/dL (ref 7.0–26.0)
CO2: 23 meq/L (ref 22–29)
CREATININE: 0.7 mg/dL (ref 0.7–1.3)
Calcium: 8.8 mg/dL (ref 8.4–10.4)
Chloride: 95 mEq/L — ABNORMAL LOW (ref 98–109)
Glucose: 99 mg/dl (ref 70–140)
Potassium: 4.4 mEq/L (ref 3.5–5.1)
Sodium: 130 mEq/L — ABNORMAL LOW (ref 136–145)
Total Protein: 6.3 g/dL — ABNORMAL LOW (ref 6.4–8.3)

## 2013-07-11 LAB — HEPATIC FUNCTION PANEL
ALBUMIN: 2.7 g/dL — AB (ref 3.5–5.2)
ALT: 16 U/L (ref 0–53)
AST: 25 U/L (ref 0–37)
Alkaline Phosphatase: 91 U/L (ref 39–117)
BILIRUBIN TOTAL: 0.5 mg/dL (ref 0.3–1.2)
Bilirubin, Direct: <0.2 mg/dL (ref 0.0–0.3)
Total Protein: 5.7 g/dL — ABNORMAL LOW (ref 6.0–8.3)

## 2013-07-11 LAB — CK TOTAL AND CKMB (NOT AT ARMC)
CK TOTAL: 37 U/L (ref 7–232)
CK, MB: 1.9 ng/mL (ref 0.0–5.0)

## 2013-07-11 LAB — LACTATE DEHYDROGENASE: LDH: 242 U/L (ref 94–250)

## 2013-07-11 LAB — PROTIME-INR
INR: 1.03 (ref 0.00–1.49)
Prothrombin Time: 13.5 seconds (ref 11.6–15.2)

## 2013-07-11 LAB — MAGNESIUM: Magnesium: 1.2 mg/dL — ABNORMAL LOW (ref 1.5–2.5)

## 2013-07-11 LAB — TROPONIN I: Troponin I: <0.3 ng/mL (ref <0.06)

## 2013-07-11 MED ORDER — IOHEXOL 300 MG/ML  SOLN
100.0000 mL | Freq: Once | INTRAMUSCULAR | Status: AC | PRN
  Administered 2013-07-11: 100 mL via INTRAVENOUS

## 2013-07-11 MED ORDER — OXYCODONE HCL 5 MG PO TABS
5.0000 mg | ORAL_TABLET | Freq: Four times a day (QID) | ORAL | Status: DC | PRN
  Administered 2013-07-11: 5 mg via ORAL
  Administered 2013-07-12 – 2013-07-14 (×3): 10 mg via ORAL
  Filled 2013-07-11: qty 2
  Filled 2013-07-11 (×3): qty 1
  Filled 2013-07-11: qty 2

## 2013-07-11 MED ORDER — ALBUTEROL SULFATE (2.5 MG/3ML) 0.083% IN NEBU
2.5000 mg | INHALATION_SOLUTION | RESPIRATORY_TRACT | Status: DC | PRN
  Administered 2013-07-12: 2.5 mg via RESPIRATORY_TRACT
  Filled 2013-07-11: qty 3

## 2013-07-11 MED ORDER — MORPHINE SULFATE 4 MG/ML IJ SOLN
INTRAMUSCULAR | Status: AC
  Filled 2013-07-11: qty 1

## 2013-07-11 MED ORDER — PANTOPRAZOLE SODIUM 40 MG PO TBEC: 40.0000 mg | DELAYED_RELEASE_TABLET | Freq: Every day | ORAL | Status: DC

## 2013-07-11 MED ORDER — ALBUTEROL SULFATE (2.5 MG/3ML) 0.083% IN NEBU: 2.5000 mg | INHALATION_SOLUTION | Freq: Four times a day (QID) | RESPIRATORY_TRACT | Status: DC

## 2013-07-11 MED ORDER — ENSURE COMPLETE PO LIQD
237.0000 mL | Freq: Two times a day (BID) | ORAL | Status: DC
  Administered 2013-07-12 (×2): 237 mL via ORAL

## 2013-07-11 MED ORDER — OXYCODONE HCL 5 MG PO TABS
5.0000 mg | ORAL_TABLET | Freq: Four times a day (QID) | ORAL | Status: DC | PRN
  Administered 2013-07-11: 5 mg via ORAL
  Filled 2013-07-11: qty 1

## 2013-07-11 MED ORDER — MIRTAZAPINE 30 MG PO TABS: 30.0000 mg | ORAL_TABLET | Freq: Every day | ORAL | Status: DC

## 2013-07-11 MED ORDER — MORPHINE SULFATE 4 MG/ML IJ SOLN
2.0000 mg | Freq: Once | INTRAMUSCULAR | Status: AC
Start: 2013-07-11 — End: 2013-07-11
  Administered 2013-07-11: 2 mg via INTRAVENOUS

## 2013-07-11 MED ORDER — SODIUM CHLORIDE 0.9 % IJ SOLN: 3.0000 mL | Freq: Two times a day (BID) | INTRAMUSCULAR | Status: DC

## 2013-07-11 MED ORDER — IPRATROPIUM-ALBUTEROL 0.5-2.5 (3) MG/3ML IN SOLN
3.0000 mL | Freq: Four times a day (QID) | RESPIRATORY_TRACT | Status: DC
  Administered 2013-07-11 – 2013-07-13 (×6): 3 mL via RESPIRATORY_TRACT
  Filled 2013-07-11 (×6): qty 3

## 2013-07-11 MED ORDER — ACETAMINOPHEN 325 MG PO TABS: 650.0000 mg | ORAL_TABLET | Freq: Four times a day (QID) | ORAL | Status: DC | PRN

## 2013-07-11 MED ORDER — ENOXAPARIN SODIUM 60 MG/0.6ML ~~LOC~~ SOLN
50.0000 mg | SUBCUTANEOUS | Status: DC
  Administered 2013-07-11 – 2013-07-13 (×3): 50 mg via SUBCUTANEOUS
  Filled 2013-07-11 (×4): qty 0.6

## 2013-07-11 MED ORDER — GUAIFENESIN-DM 100-10 MG/5ML PO SYRP: 5.0000 mL | ORAL_SOLUTION | ORAL | Status: DC | PRN

## 2013-07-11 MED ORDER — SODIUM CHLORIDE 0.9 % IV SOLN: INTRAVENOUS | Status: DC

## 2013-07-11 MED ORDER — IPRATROPIUM BROMIDE 0.02 % IN SOLN: 0.5000 mg | Freq: Four times a day (QID) | RESPIRATORY_TRACT | Status: DC

## 2013-07-11 MED ORDER — SODIUM CHLORIDE 0.9 % IV SOLN
4.0000 mg | Freq: Once | INTRAVENOUS | Status: AC
  Administered 2013-07-11: 4 mg via INTRAVENOUS
  Filled 2013-07-11: qty 2

## 2013-07-11 MED ORDER — SODIUM CHLORIDE 0.9 % IV SOLN
Freq: Once | INTRAVENOUS | Status: AC
  Administered 2013-07-11: 16:00:00 via INTRAVENOUS

## 2013-07-11 MED ORDER — HYDRALAZINE HCL 20 MG/ML IJ SOLN
10.0000 mg | Freq: Four times a day (QID) | INTRAMUSCULAR | Status: DC | PRN
  Filled 2013-07-11: qty 0.5

## 2013-07-11 MED ORDER — GABAPENTIN 100 MG PO CAPS
100.0000 mg | ORAL_CAPSULE | Freq: Three times a day (TID) | ORAL | Status: DC
  Administered 2013-07-11 – 2013-07-14 (×9): 100 mg via ORAL
  Filled 2013-07-11 (×10): qty 1

## 2013-07-11 MED ORDER — PROCHLORPERAZINE MALEATE 10 MG PO TABS
10.0000 mg | ORAL_TABLET | Freq: Four times a day (QID) | ORAL | Status: DC | PRN
  Filled 2013-07-11: qty 1

## 2013-07-11 MED ORDER — MORPHINE SULFATE ER 30 MG PO TBCR
30.0000 mg | EXTENDED_RELEASE_TABLET | Freq: Two times a day (BID) | ORAL | Status: DC
  Administered 2013-07-11 – 2013-07-14 (×6): 30 mg via ORAL
  Filled 2013-07-11 (×6): qty 1

## 2013-07-11 MED ORDER — ACETAMINOPHEN 650 MG RE SUPP: 650.0000 mg | Freq: Four times a day (QID) | RECTAL | Status: DC | PRN

## 2013-07-11 NOTE — Patient Instructions (Signed)
Dehydration, Adult Dehydration is when you lose more fluids from the body than you take in. Vital organs like the kidneys, brain, and heart cannot function without a proper amount of fluids and salt. Any loss of fluids from the body can cause dehydration.  CAUSES   Vomiting.  Diarrhea.  Excessive sweating.  Excessive urine output.  Fever. SYMPTOMS  Mild dehydration  Thirst.  Dry lips.  Slightly dry mouth. Moderate dehydration  Very dry mouth.  Sunken eyes.  Skin does not bounce back quickly when lightly pinched and released.  Dark urine and decreased urine production.  Decreased tear production.  Headache. Severe dehydration  Very dry mouth.  Extreme thirst.  Rapid, weak pulse (more than 100 beats per minute at rest).  Cold hands and feet.  Not able to sweat in spite of heat and temperature.  Rapid breathing.  Blue lips.  Confusion and lethargy.  Difficulty being awakened.  Minimal urine production.  No tears. DIAGNOSIS  Your caregiver will diagnose dehydration based on your symptoms and your exam. Blood and urine tests will help confirm the diagnosis. The diagnostic evaluation should also identify the cause of dehydration. TREATMENT  Treatment of mild or moderate dehydration can often be done at home by increasing the amount of fluids that you drink. It is best to drink small amounts of fluid more often. Drinking too much at one time can make vomiting worse. Refer to the home care instructions below. Severe dehydration needs to be treated at the hospital where you will probably be given intravenous (IV) fluids that contain water and electrolytes. HOME CARE INSTRUCTIONS   Ask your caregiver about specific rehydration instructions.  Drink enough fluids to keep your urine clear or pale yellow.  Drink small amounts frequently if you have nausea and vomiting.  Eat as you normally do.  Avoid:  Foods or drinks high in sugar.  Carbonated  drinks.  Juice.  Extremely hot or cold fluids.  Drinks with caffeine.  Fatty, greasy foods.  Alcohol.  Tobacco.  Overeating.  Gelatin desserts.  Wash your hands well to avoid spreading bacteria and viruses.  Only take over-the-counter or prescription medicines for pain, discomfort, or fever as directed by your caregiver.  Ask your caregiver if you should continue all prescribed and over-the-counter medicines.  Keep all follow-up appointments with your caregiver. SEEK MEDICAL CARE IF:  You have abdominal pain and it increases or stays in one area (localizes).  You have a rash, stiff neck, or severe headache.  You are irritable, sleepy, or difficult to awaken.  You are weak, dizzy, or extremely thirsty. SEEK IMMEDIATE MEDICAL CARE IF:   You are unable to keep fluids down or you get worse despite treatment.  You have frequent episodes of vomiting or diarrhea.  You have blood or green matter (bile) in your vomit.  You have blood in your stool or your stool looks black and tarry.  You have not urinated in 6 to 8 hours, or you have only urinated a small amount of very dark urine.  You have a fever.  You faint. MAKE SURE YOU:   Understand these instructions.  Will watch your condition.  Will get help right away if you are not doing well or get worse. Document Released: 12/20/2004 Document Revised: 03/14/2011 Document Reviewed: 08/09/2010 Exit[employer] Patient Information 2015 [employer]. This information is not intended to replace advice given to you by your health care provider. Make sure you discuss any questions you have with your health care   provider.  

## 2013-07-11 NOTE — H&P (Addendum)
[facility] History and Physical  Fred [name] BMW:[id number] DOB: April 22, 1970 DOA: 07/11/2013  Referring physician: Dr. Julien Nordmann PCP: [name], MD   Chief Complaint: Sent from oncology clinic for dyspnea on exertion for past 2-3 days    HPI:  43 year old male with history of esophageal adenocarcinoma with osseous, pulmonary, hepatic and peritoneal metastases undergoing chemotherapy and radiation therapy was sent from [place] for symptoms of progressive shortness of breath on exertion for past 2-3 days. Patient had scheduled CT scan of the chest and abdomen to evaluate response to chemotherapy and short significant bilateral large pleural effusions with compressive atelectasis. Patient reports that for last 2-3 he has been dyspneic on minimal exertion and even trying to get out of bed.  Patient denies headache, dizziness, fever, chills, nausea , vomiting, chest pain, orthopnea or PND, hemoptysis, palpitations,  abdominal pain, bowel or urinary symptoms. He reports poor appetite for past several days and due to his cancer he has only been taking clear liquids. He denies any recent illness.  Patient seen in the oncology clinic and given his progressive dyspnea on exertion and CT findings, hospitalist was consulted for admission with need for bilateral thoracentesis.  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  Review of Systems:  Constitutional: Denies fever, chills, diaphoresis, appetite change and fatigue.  HEENT: Dysphagia +,  Denies photophobia, eye pain, redness, , ear pain, congestion, sore throat, rhinorrhea,  mouth sores,, neck pain, neck stiffness and tinnitus.   Respiratory:  SOB, DOE, denies Cough, chest tightness,  and wheezing.   Cardiovascular: Denies chest pain, palpitations and leg swelling.  Gastrointestinal: Denies nausea, vomiting, abdominal  pain, diarrhea, constipation, blood in stool and abdominal distention.  Genitourinary: Denies dysuria, urgency, frequency, hematuria, flank pain and difficulty urinating.  Endocrine: Denies: hot or cold intolerance,  polyuria, polydipsia. Musculoskeletal:back pain, Denies myalgias,  joint swelling, arthralgias and gait problem.  Skin: Denies pallor, rash and wound.  Neurological: Generalized weakness, Denies dizziness, seizures, syncope,  light-headedness, numbness and headaches.  Hematological: Denies adenopathy.  Psychiatric/Behavioral: Denies confusion, nervousness, sleep disturbance and agitation   Past Medical History  Diagnosis Date  . GERD (gastroesophageal reflux disease)     cannot taste anything  . 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 No pertinent past surgical history. Social History:  reports that he has never smoked. He has quit using smokeless tobacco. His smokeless tobacco use included Chew. He reports that he drinks alcohol. He reports that he does not use illicit drugs.  No Known Allergies  History reviewed. No pertinent family history.  Prior to Admission medications   Medication Sig Start Date End Date Taking? Authorizing Provider  gabapentin (NEURONTIN) 100 MG capsule Take 1 capsule (100 mg total) by mouth 3 (three) times daily. 04/29/13  Yes Curt Bears, MD  morphine (MS CONTIN) 30 MG 12 hr tablet Take a 30 mg tablet EVER 8 HOURS 05/20/13  Yes Adrena E Johnson, PA-C  lidocaine-prilocaine (EMLA) cream Apply 1 application topically as needed. Apply to port 1 hr before chemo 03/13/13   Curt Bears, MD  mirtazapine (REMERON) 30 MG tablet Take 1 tablet (30 mg total) by mouth at bedtime. 04/29/13   Curt Bears, MD  omeprazole (PRILOSEC) 20 MG capsule Take 20 mg by mouth daily. 02/21/13   Historical Provider, MD  oxycodone (  OXY-IR) 5 MG capsule Take 1 capsule (5 mg total) by  mouth every 6 (six) hours as needed for pain (1-2 tablets every 6 hours as needed). 06/17/13   Curt Bears, MD  prochlorperazine (COMPAZINE) 10 MG tablet Take 1 tablet (10 mg total) by mouth every 6 (six) hours as needed for nausea or vomiting. 03/11/13   Curt Bears, MD     Physical Exam:  Filed Vitals:   07/11/13 1740  BP: 147/106  Pulse: 141  Temp: 98.4 F (36.9 C)  TempSrc: Oral  Resp: 32  SpO2: 96%    Constitutional: Vital signs reviewed.   middle aged male in no acute distress HEENT: Pallor present, no icterus, dry oral mucosa, no cervical lymphadenopathy Cardiovascular: S1 and S2 tachycardic, no murmurs rub or gallop Chest: Markedly diminished bilateral breath sounds R>>L, no crackles wheezes or rhonchi Abdominal: Soft. Non-tender, non-distended, bowel sounds are normal, no masses, organomegaly, or guarding present.  Ext: warm, no edema Neurological: A&O x3, non focal  Labs on Admission:  Basic Metabolic Panel:  Recent Labs Lab 07/11/13 1606  NA 130*  K 4.4  CO2 23  GLUCOSE 99  BUN 7.5  CREATININE 0.7  CALCIUM 8.8   Liver Function Tests:  Recent Labs Lab 07/11/13 1606  AST 27  ALT 20  ALKPHOS 93  BILITOT 0.75  PROT 6.3*  ALBUMIN 3.0*   No results found for this basename: LIPASE, AMYLASE,  in the last 168 hours No results found for this basename: AMMONIA,  in the last 168 hours CBC:  Recent Labs Lab 07/11/13 1604  WBC 3.6*  NEUTROABS 2.3  HGB 9.0*  HCT 27.0*  MCV 89.3  PLT 238   Cardiac Enzymes:  Recent Labs Lab 07/11/13 1605 07/11/13 1606  CKTOTAL 37  --   CKMB 1.9  --   TROPONINI  --  <0.30   BNP: No components found with this basename: POCBNP,  CBG: No results found for this basename: GLUCAP,  in the last 168 hours  Radiological Exams on Admission: Ct Chest W Contrast  07/11/2013   CLINICAL DATA:  Esophageal cancer air with metastasis.  EXAM: CT CHEST, ABDOMEN, AND PELVIS WITH CONTRAST  TECHNIQUE: Multidetector CT imaging  of the chest, abdomen and pelvis was performed following the standard protocol during bolus administration of intravenous contrast.  CONTRAST:  174mL OMNIPAQUE IOHEXOL 300 MG/ML  SOLN  COMPARISON:  Chest CT 05/20/2013 and PET-CT 02/07/2013  FINDINGS: CT CHEST FINDINGS  The chest wall is unremarkable and stable. No chest wall lesions or axillary adenopathy. The right Port-A-Cath is stable. There are enlarged left-sided supraclavicular lymph nodes which appears stable. Maximum transverse measurement on image number 4 is 29 mm and was previously 27 mm. The thyroid gland is normal. The lymph nodes noted in the subpectoral region are stable also.  Diffuse osseous metastatic disease is again demonstrated. I do not see any significant interval change when compared to most recent prior CT scan. Stable thoracic spine decompression and fusion.  The heart is normal in size. No pericardial effusion. Significant progression of mediastinal lymphadenopathy with aorticopulmonary window nodal mass measuring a maximum of 2.3 cm. The esophageal mass is difficult to measure but overall appears markedly enlarged. Adjacent adenopathy is noted. There is significant soft tissue surrounding the region of the esophagus and aorta which is worrisome for markedly progressive tumor. There are also large bilateral pleural effusions with significant compressive atelectasis of both lungs. There is an enhancing pleural nodule in the right lower lobe  on image number 35.  No obvious pulmonary nodules in the aerated aspects of both lungs.  CT ABDOMEN AND PELVIS FINDINGS  Hepatic metastatic disease is again demonstrated. The largest lesion in the right hepatic lobe on image number 59 measures 2.7 x 2.2 cm and previously measured 2.3 x 2.0 cm. The lesion at the hepatic dome on image number 52 measures 3.5 x 2.7 cm and previously measured 2.3 x 2.3 cm. Possible small new lesions involving the medial segment of the left hepatic lobe.  The gallbladder is  normal. No common bile duct dilatation. The pancreas is normal. The spleen is normal. The adrenal glands and kidneys are unremarkable and stable. 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  New and progressive peritoneal implants are noted. The Large irregular mass near the spleen is slightly progressive. There is a new right paraspinal mass on image number 68 which measures 2.5 x 2.4 cm. A smaller left paraspinal mass is noted on the left.  The aorta and branch vessels are patent. 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 Small scattered retroperitoneal lymph nodes.  The bladder, prostate gland and seminal vesicles are unremarkable. No pelvic mass or adenopathy. No inguinal mass or adenopathy.  Stable osseous metastatic disease involving the spine and pelvis.  IMPRESSION: 1. Overall stable left supraclavicular lymphadenopathy appear 2. Significant progression of mediastinal lymphadenopathy and tumor surrounding the esophagus and aorta. 3. Large bilateral pleural effusions with significant compressive atelectasis of both lungs. There is an enhancing pleural nodule in the right lower lobe on image number 35. 4. Progressive hepatic metastatic disease. 5. Progressive peritoneal implants in the abdomen. 6. Stable diffuse osseous metastatic disease.   Electronically Signed   By: Kalman Jewels M.D.   On: 07/11/2013 13:33   Ct Abdomen Pelvis W Contrast  07/11/2013   CLINICAL DATA:  Esophageal cancer air with metastasis.  EXAM: CT CHEST, ABDOMEN, AND PELVIS WITH CONTRAST  TECHNIQUE: Multidetector CT imaging of the chest, abdomen and pelvis was performed following the standard protocol during bolus administration of intravenous contrast.  CONTRAST:  170mL OMNIPAQUE IOHEXOL 300 MG/ML  SOLN  COMPARISON:  Chest CT 05/20/2013 and PET-CT 02/07/2013  FINDINGS: CT CHEST FINDINGS  The chest wall is unremarkable and stable. No chest wall lesions or axillary adenopathy. The right Port-A-Cath is stable. There are  enlarged left-sided supraclavicular lymph nodes which appears stable. Maximum transverse measurement on image number 4 is 29 mm and was previously 27 mm. The thyroid gland is normal. The lymph nodes noted in the subpectoral region are stable also.  Diffuse osseous metastatic disease is again demonstrated. I do not see any significant interval change when compared to most recent prior CT scan. Stable thoracic spine decompression and fusion.  The heart is normal in size. No pericardial effusion. Significant progression of mediastinal lymphadenopathy with aorticopulmonary window nodal mass measuring a maximum of 2.3 cm. The esophageal mass is difficult to measure but overall appears markedly enlarged. Adjacent adenopathy is noted. There is significant soft tissue surrounding the region of the esophagus and aorta which is worrisome for markedly progressive tumor. There are also large bilateral pleural effusions with significant compressive atelectasis of both lungs. There is an enhancing pleural nodule in the right lower lobe on image number 35.  No obvious pulmonary nodules in the aerated aspects of both lungs.  CT ABDOMEN AND PELVIS FINDINGS  Hepatic metastatic disease is again demonstrated. The largest lesion in the right hepatic lobe on image number 59 measures 2.7  x 2.2 cm and previously measured 2.3 x 2.0 cm. The lesion at the hepatic dome on image number 52 measures 3.5 x 2.7 cm and previously measured 2.3 x 2.3 cm. Possible small new lesions involving the medial segment of the left hepatic lobe.  The gallbladder is normal. No common bile duct dilatation. The pancreas is normal. The spleen is normal. The adrenal glands and kidneys are unremarkable and stable. 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  New and progressive peritoneal implants are noted. The Large irregular mass near the spleen is slightly progressive. There is a new right paraspinal mass on image number 68 which measures  2.5 x 2.4 cm. A smaller left paraspinal mass is noted on the left.  The aorta and branch vessels are patent. 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 Small scattered retroperitoneal lymph nodes.  The bladder, prostate gland and seminal vesicles are unremarkable. No pelvic mass or adenopathy. No inguinal mass or adenopathy.  Stable osseous metastatic disease involving the spine and pelvis.  IMPRESSION: 1. Overall stable left supraclavicular lymphadenopathy appear 2. Significant progression of mediastinal lymphadenopathy and tumor surrounding the esophagus and aorta. 3. Large bilateral pleural effusions with significant compressive atelectasis of both lungs. There is an enhancing pleural nodule in the right lower lobe on image number 35. 4. Progressive hepatic metastatic disease. 5. Progressive peritoneal implants in the abdomen. 6. Stable diffuse osseous metastatic disease.   Electronically Signed   By: Kalman Jewels M.D.   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 O2 sat maintained on room air. Continue oxygen via nasal cannula when necessary and scheduled duoneb as well as when necessary albuterol neb. Patient is tachycardic with elevated blood pressure on presentation. He needs  therapeutic bilateral thoracenteses which can be done in am. Monitor for respiratory distress.  -Check proBNP and I/O. No signs of infiltrate on imaging. -IR consult placed  for thoracenteses in a.m. N.p.o. after midnight. Patient appears dehydrated on exam. I will place him on gentle hydration. -Patient has esophageal adenocarcinoma with pulmonary, hepatic, osseous and peritoneal metastases. Patient is currently undergoing chemotherapy and radiation therapy. His oncologist Dr. Julien Nordmann will follow  patient while in the hospital. -Resume home pain medications which includes MS Contin 30 mg twice a day and oxycodone 50 mg every 6 hours as needed.      Active Problems: GERD Continue PPI   Elevated blood pressure No history of underlying hypertension. I will place him on when necessary hydralazine.  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  Asymptomatic with normal renal function.  Hyponatremia Possibly dehydration vs SIADH. Will send urine lytes  Diet:clears , NPO after midnight  DVT prophylaxis: sq lovenox, hold in am   Code Status: full code Family Communication: wife at bedside Disposition Plan: home once improved  Zahriyah Joo, [place] [facility] Pager [phone]  Total time spent on admission :60 minutes  If 7PM-7AM, please contact night-coverage [email or web address] Password [email or web address] 07/11/2013, 6:05 PM

## 2013-07-11 NOTE — Telephone Encounter (Signed)
Pt's wife called stating that pt is feeling more discomfort in his chest and increased SOB over the past 2-3 days.  He has been walking but now is using a w/c while at the hospital while he gets his scan and goes to radiology.  He is currently not in acute distress.  She is requesting for pt to be seen.  Per Dr Vista Mink, pt can be see by Selena Lesser. Spoke to [place], she can see pt today at 2pm.

## 2013-07-12 ENCOUNTER — Ambulatory Visit: Payer: BC Managed Care – PPO | Admitting: Physician Assistant

## 2013-07-12 ENCOUNTER — Telehealth: Payer: Self-pay | Admitting: Radiation Oncology

## 2013-07-12 ENCOUNTER — Inpatient Hospital Stay (HOSPITAL_COMMUNITY): Payer: BC Managed Care – PPO

## 2013-07-12 ENCOUNTER — Other Ambulatory Visit: Payer: Self-pay | Admitting: *Deleted

## 2013-07-12 ENCOUNTER — Other Ambulatory Visit: Payer: BC Managed Care – PPO

## 2013-07-12 ENCOUNTER — Ambulatory Visit
Admission: RE | Admit: 2013-07-12 | Discharge: 2013-07-12 | Disposition: A | Discharge status: Home / Self Care | Payer: BC Managed Care – PPO | Source: Ambulatory Visit | Attending: Radiation Oncology | Admitting: Radiation Oncology

## 2013-07-12 ENCOUNTER — Encounter: Payer: Self-pay | Admitting: Nurse Practitioner

## 2013-07-12 ENCOUNTER — Ambulatory Visit: Admission: RE | Admit: 2013-07-12 | Payer: BC Managed Care – PPO | Source: Ambulatory Visit

## 2013-07-12 ENCOUNTER — Ambulatory Visit: Payer: BC Managed Care – PPO

## 2013-07-12 DIAGNOSIS — T451X5A Adverse effect of antineoplastic and immunosuppressive drugs, initial encounter: Secondary | ICD-10-CM

## 2013-07-12 DIAGNOSIS — D649 Anemia, unspecified: Secondary | ICD-10-CM | POA: Insufficient documentation

## 2013-07-12 DIAGNOSIS — R630 Anorexia: Secondary | ICD-10-CM | POA: Insufficient documentation

## 2013-07-12 DIAGNOSIS — R5381 Other malaise: Secondary | ICD-10-CM

## 2013-07-12 DIAGNOSIS — E8809 Other disorders of plasma-protein metabolism, not elsewhere classified: Secondary | ICD-10-CM | POA: Insufficient documentation

## 2013-07-12 DIAGNOSIS — C155 Malignant neoplasm of lower third of esophagus: Secondary | ICD-10-CM

## 2013-07-12 DIAGNOSIS — C7949 Secondary malignant neoplasm of other parts of nervous system: Secondary | ICD-10-CM

## 2013-07-12 DIAGNOSIS — C7951 Secondary malignant neoplasm of bone: Secondary | ICD-10-CM

## 2013-07-12 DIAGNOSIS — D6481 Anemia due to antineoplastic chemotherapy: Secondary | ICD-10-CM

## 2013-07-12 DIAGNOSIS — C159 Malignant neoplasm of esophagus, unspecified: Secondary | ICD-10-CM

## 2013-07-12 DIAGNOSIS — C7800 Secondary malignant neoplasm of unspecified lung: Secondary | ICD-10-CM

## 2013-07-12 DIAGNOSIS — E871 Hypo-osmolality and hyponatremia: Secondary | ICD-10-CM

## 2013-07-12 DIAGNOSIS — E46 Unspecified protein-calorie malnutrition: Secondary | ICD-10-CM | POA: Insufficient documentation

## 2013-07-12 DIAGNOSIS — R1310 Dysphagia, unspecified: Secondary | ICD-10-CM | POA: Insufficient documentation

## 2013-07-12 DIAGNOSIS — C787 Secondary malignant neoplasm of liver and intrahepatic bile duct: Secondary | ICD-10-CM

## 2013-07-12 DIAGNOSIS — R5383 Other fatigue: Secondary | ICD-10-CM

## 2013-07-12 DIAGNOSIS — E860 Dehydration: Secondary | ICD-10-CM

## 2013-07-12 DIAGNOSIS — C7931 Secondary malignant neoplasm of brain: Secondary | ICD-10-CM

## 2013-07-12 DIAGNOSIS — J90 Pleural effusion, not elsewhere classified: Secondary | ICD-10-CM

## 2013-07-12 DIAGNOSIS — C7952 Secondary malignant neoplasm of bone marrow: Secondary | ICD-10-CM

## 2013-07-12 LAB — CBC
HCT: 23.1 % — ABNORMAL LOW (ref 39.0–52.0)
Hemoglobin: 8 g/dL — ABNORMAL LOW (ref 13.0–17.0)
MCH: 30.3 pg (ref 26.0–34.0)
MCHC: 34.6 g/dL (ref 30.0–36.0)
MCV: 87.5 fL (ref 78.0–100.0)
PLATELETS: 176 10*3/uL (ref 150–400)
RBC: 2.64 MIL/uL — AB (ref 4.22–5.81)
RDW: 19.1 % — ABNORMAL HIGH (ref 11.5–15.5)
WBC: 2.9 10*3/uL — AB (ref 4.0–10.5)

## 2013-07-12 LAB — BODY FLUID CELL COUNT WITH DIFFERENTIAL
Eos, Fluid: 5 %
LYMPHS FL: 9 %
Monocyte-Macrophage-Serous Fluid: 9 % — ABNORMAL LOW (ref 50–90)
Neutrophil Count, Fluid: 77 % — ABNORMAL HIGH (ref 0–25)
Total Nucleated Cell Count, Fluid: 1813 cu mm — ABNORMAL HIGH (ref 0–1000)

## 2013-07-12 LAB — SODIUM, URINE, RANDOM

## 2013-07-12 LAB — URINALYSIS, ROUTINE W REFLEX MICROSCOPIC
BILIRUBIN URINE: NEGATIVE
Glucose, UA: NEGATIVE mg/dL
Hgb urine dipstick: NEGATIVE
Ketones, ur: NEGATIVE mg/dL
LEUKOCYTES UA: NEGATIVE
NITRITE: NEGATIVE
PH: 6.5 (ref 5.0–8.0)
Protein, ur: NEGATIVE mg/dL
SPECIFIC GRAVITY, URINE: 1.02 (ref 1.005–1.030)
Urobilinogen, UA: 1 mg/dL (ref 0.0–1.0)

## 2013-07-12 LAB — LACTATE DEHYDROGENASE, PLEURAL OR PERITONEAL FLUID: LD FL: 6348 U/L — AB (ref 3–23)

## 2013-07-12 LAB — ABO/RH: ABO/RH(D): O POS

## 2013-07-12 LAB — CREATININE, URINE, RANDOM: CREATININE, URINE: 84.6 mg/dL

## 2013-07-12 LAB — PREPARE RBC (CROSSMATCH)

## 2013-07-12 LAB — GLUCOSE, SEROUS FLUID

## 2013-07-12 MED ORDER — ENSURE COMPLETE PO LIQD
237.0000 mL | Freq: Three times a day (TID) | ORAL | Status: DC
  Administered 2013-07-12 – 2013-07-14 (×4): 237 mL via ORAL

## 2013-07-12 MED ORDER — FUROSEMIDE 10 MG/ML IJ SOLN
20.0000 mg | Freq: Once | INTRAMUSCULAR | Status: AC
  Administered 2013-07-12: 20 mg via INTRAVENOUS
  Filled 2013-07-12: qty 2

## 2013-07-12 MED ORDER — HYDROMORPHONE HCL PF 1 MG/ML IJ SOLN
1.0000 mg | INTRAMUSCULAR | Status: DC | PRN
  Administered 2013-07-12 – 2013-07-13 (×2): 1 mg via INTRAVENOUS
  Filled 2013-07-12 (×2): qty 1

## 2013-07-12 MED ORDER — SODIUM CHLORIDE 0.9 % IJ SOLN
10.0000 mL | INTRAMUSCULAR | Status: DC | PRN
  Administered 2013-07-14 (×2): 10 mL

## 2013-07-12 NOTE — Assessment & Plan Note (Signed)
Chemo induced anemia.  Will monitor closely.

## 2013-07-12 NOTE — Assessment & Plan Note (Signed)
Increasing bilat pleural effusions noted on scans today. Pt refuses O2 while in [place] today; stating that he does not need it.

## 2013-07-12 NOTE — Assessment & Plan Note (Signed)
Has been receiving cisplatin/irinotecan chemotherapy; as well as radiation therapy.  Given progression of disease per today's restaging scans- will defer tx plan options to Dr. Earlie Server.

## 2013-07-12 NOTE — Progress Notes (Signed)
[place]   Chief Complaint  Patient presents with  . Chest Pain  . Shortness of Breath  . Dehydration    HPI: Fred [name] 43 y.o. male diagnosed with esophageal cancer; and currently undergoing both cisplatin/irinotecan chemotherapy and radiation therapy.    CURRENT THERAPY: Upcoming Treatment Dates - Esophageal Cisplatin / Irinotecan D1,8 q21d Days with orders from any treatment category:  07/12/2013      SCHEDULING COMMUNICATION      fosaprepitant (EMEND) 150 mg in sodium chloride 0.9 % 145 mL IVPB      palonosetron (ALOXI) injection 0.25 mg      Dexamethasone Sodium Phosphate (DECADRON) injection 12 mg      irinotecan (CAMPTOSAR) 150 mg in dextrose 5 % 500 mL chemo infusion      CISplatin (PLATINOL) 70 mg in sodium chloride 0.9 % 250 mL chemo infusion      atropine injection 0.5 mg      sodium chloride 0.9 % injection 10 mL      heparin lock flush 100 unit/mL      heparin lock flush 100 unit/mL      alteplase (CATHFLO ACTIVASE) injection 2 mg      sodium chloride 0.9 % injection 3 mL      0.9 %  sodium chloride infusion      dextrose 5 % and 0.45% NaCl 1,000 mL with potassium chloride 20 mEq, magnesium sulfate 12 mEq infusion      TREATMENT CONDITIONS 07/19/2013      SCHEDULING COMMUNICATION      fosaprepitant (EMEND) 150 mg in sodium chloride 0.9 % 145 mL IVPB      palonosetron (ALOXI) injection 0.25 mg      Dexamethasone Sodium Phosphate (DECADRON) injection 12 mg      irinotecan (CAMPTOSAR) 150 mg in dextrose 5 % 500 mL chemo infusion      CISplatin (PLATINOL) 70 mg in sodium chloride 0.9 % 250 mL chemo infusion      atropine injection 0.5 mg      sodium chloride 0.9 % injection 10 mL      heparin lock flush 100 unit/mL      heparin lock flush 100 unit/mL      alteplase (CATHFLO ACTIVASE) injection 2 mg      sodium chloride 0.9 % injection 3 mL      0.9 %  sodium chloride infusion      dextrose 5 % and 0.45% NaCl 1,000 mL with potassium chloride  20 mEq, magnesium sulfate 12 mEq infusion      TREATMENT CONDITIONS 08/02/2013      SCHEDULING COMMUNICATION      fosaprepitant (EMEND) 150 mg in sodium chloride 0.9 % 145 mL IVPB      palonosetron (ALOXI) injection 0.25 mg      Dexamethasone Sodium Phosphate (DECADRON) injection 12 mg      irinotecan (CAMPTOSAR) 150 mg in dextrose 5 % 500 mL chemo infusion      CISplatin (PLATINOL) 70 mg in sodium chloride 0.9 % 250 mL chemo infusion      atropine injection 0.5 mg      sodium chloride 0.9 % injection 10 mL      heparin lock flush 100 unit/mL      heparin lock flush 100 unit/mL      alteplase (CATHFLO ACTIVASE) injection 2 mg      sodium chloride 0.9 % injection 3 mL      0.9 %  sodium chloride infusion      dextrose 5 % and 0.45% NaCl 1,000 mL with potassium chloride 20 mEq, magnesium sulfate 12 mEq infusion      TREATMENT CONDITIONS   REASON FOR VISIT: C/o worsening shortness of breath with any type of exertion; and well as pain with inspiration for last few days. Denies any fever or chills. C/o continued dysphagia and anorexia; and feels dehydrated adn increasingly weak.  Unable to stand without assist now. Had RT today;and just returned from restaging CT of chest/abd/pelvis prior to this exam.    Past Medical History  Diagnosis Date  . GERD (gastroesophageal reflux disease)     cannot taste anything  . 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     has No Known Allergies.    Medication List         This list is accurate as of: 07/11/13  5:33 PM.  Always use your most recent med list.               gabapentin 100 MG capsule  Commonly known as:  NEURONTIN  Take 1 capsule (100 mg total) by mouth 3 (three) times daily.     lidocaine-prilocaine cream  Commonly known as:  EMLA  Apply 1 application topically as needed. Apply to port 1 hr before chemo     mirtazapine 30 MG tablet  Commonly known as:  REMERON  Take 1 tablet (30 mg total) by mouth at bedtime.     morphine 30 MG 12 hr tablet  Commonly known as:  MS CONTIN  Take a 30 mg tablet EVER 8 HOURS     omeprazole 20 MG capsule  Commonly known as:  PRILOSEC  Take 20 mg by mouth daily.     oxycodone 5 MG capsule  Commonly known as:  OXY-IR  Take 1 capsule (5 mg total) by mouth every 6 (six) hours as needed for pain (1-2 tablets every 6 hours as needed).     prochlorperazine 10 MG tablet  Commonly known as:  COMPAZINE  Take 1 tablet (10 mg total) by mouth every 6 (six) hours as needed for nausea or vomiting.          PHYSICAL EXAMINATION  Filed Vitals:   07/11/13 1430  BP: 134/96  Pulse: 140  Temp: 98.2 F (36.8 C)  Resp: 20    GENERAL:alert, cachectic, ill looking, malnourished and appears very weak and fatigued.  Unable to stand without assist. Also appears pale.  SKIN: no rashes or significant lesions HEAD: Normocephalic OROPHARYNX:lips, buccal mucosa, and tongue normal  NECK: supple, no adenopathy, no bruits, no JVD LYMPH:  no palpable lymphadenopathy LUNGS: coarse sounds heard, decreased breath sounds, no cough, no obvious resp distress noted on exam.  HEART: no murmurs, no gallops and tachycardia ABDOMEN:abdomen soft, non-tender, normal bowel sounds and no masses or organomegaly BACK: No CVA tenderness, Range of motion is normal EXTREMITIES:no edema, no clubbing, no cyanosis  NEURO: alert & oriented x 3 with fluent speech PSYCH: Depressed appearing.  LABORATORY DATA:. CBC  Lab Results  Component  Value Date  WBC 2.9* 07/12/2013   RBC 2.64* 07/12/2013   HGB 8.0* 07/12/2013   HCT 23.1* 07/12/2013   PLT 176 07/12/2013   MCV 87.5 07/12/2013   MCH 30.3 07/12/2013   MCHC 34.6 07/12/2013   RDW 19.1* 07/12/2013   LYMPHSABS 0.5* 07/11/2013   MONOABS 0.5 07/11/2013   EOSABS 0.1 07/11/2013   BASOSABS 0.0 07/11/2013     CMET  Lab Results  Component Value Date   NA 130* 07/11/2013   K 4.4 07/11/2013   CL 95* 02/11/2013   CO2 23 07/11/2013   GLUCOSE 99 07/11/2013   BUN 7.5 07/11/2013   CREATININE 0.7 07/11/2013   CALCIUM 8.8 07/11/2013   PROT 5.7* 07/11/2013   ALBUMIN 2.7* 07/11/2013   AST 25 07/11/2013   ALT 16 07/11/2013   ALKPHOS 91 07/11/2013   BILITOT 0.5 07/11/2013   GFRNONAA >90 02/11/2013   GFRAA >90 02/11/2013   RADIOGRAPHIC STUDIES:CLINICAL DATA: Esophageal cancer air with metastasis.  EXAM:  CT CHEST, ABDOMEN, AND PELVIS WITH CONTRAST  TECHNIQUE:  Multidetector CT imaging of the chest, abdomen and pelvis was  performed following the standard protocol during bolus  administration of intravenous contrast.  CONTRAST: 125mL OMNIPAQUE IOHEXOL 300 MG/ML SOLN  COMPARISON: Chest CT 05/20/2013 and PET-CT 02/07/2013  FINDINGS:  CT CHEST FINDINGS  The chest wall is unremarkable and stable. No chest wall lesions or  axillary adenopathy. The right Port-A-Cath is stable. There are  enlarged left-sided supraclavicular lymph nodes which appears  stable. Maximum transverse measurement on image number 4 is 29 mm  and was previously 27 mm. The thyroid gland is normal. The lymph  nodes noted in the subpectoral region are stable also.  Diffuse osseous metastatic disease is again demonstrated. I do not  see any significant interval change when compared to most recent  prior CT scan. Stable thoracic spine decompression and fusion.  The heart is normal in size. No pericardial effusion. Significant  progression of mediastinal lymphadenopathy with aorticopulmonary  window nodal mass measuring a maximum of 2.3 cm. The  esophageal mass  is difficult to measure but overall appears markedly enlarged.  Adjacent adenopathy is noted. There is significant soft tissue  surrounding the region of the esophagus and aorta which is worrisome  for markedly progressive tumor. There are also large bilateral  pleural effusions with significant compressive atelectasis of both  lungs. There is an enhancing pleural nodule in the right lower lobe  on image number 35.  No obvious pulmonary nodules in the aerated aspects of both lungs.  CT ABDOMEN AND PELVIS FINDINGS  Hepatic metastatic disease is again demonstrated. The largest lesion  in the right hepatic lobe on image number 59 measures 2.7 x 2.2 cm  and previously measured 2.3 x 2.0 cm. The lesion at the hepatic dome  on image number 52 measures 3.5 x 2.7 cm and previously measured 2.3  x 2.3 cm. Possible small new lesions involving the medial segment of  the left hepatic lobe.  The gallbladder is normal. No common bile duct dilatation. The  pancreas is normal. The spleen is normal. The adrenal glands and  kidneys are unremarkable and stable. 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  New and progressive peritoneal implants are noted. The Large  irregular mass near the spleen is slightly progressive. There is a  new right paraspinal mass on image number 68 which measures 2.5 x  2.4 cm. A smaller left paraspinal mass is noted on the left.  The aorta and branch vessels are patent. 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 Small scattered retroperitoneal lymph nodes.  The bladder, prostate gland and seminal vesicles are unremarkable.  No pelvic mass or adenopathy. No inguinal mass or adenopathy.  Stable osseous metastatic disease involving the spine and pelvis.  IMPRESSION:  1. Overall stable left supraclavicular lymphadenopathy appear  2. Significant progression of mediastinal lymphadenopathy and tumor  surrounding the esophagus and aorta.  3. Large  bilateral pleural effusions with significant compressive  atelectasis of both lungs. There is an enhancing pleural nodule in  the right lower lobe on image number 35.  4. Progressive hepatic metastatic disease.  5. Progressive peritoneal implants in the abdomen.  6. Stable diffuse osseous metastatic disease.   EKG: Sinus tach with 1st degree AV block; rate 125.  ASSESSMENT/PLAN:    Esophageal cancer  Assessment & Plan Has been receiving cisplatin/irinotecan chemotherapy; as well as radiation therapy.  Given progression of disease per today's restaging scans- will defer tx plan options to Dr. Earlie Server.    Metastatic cancer to spine  Assessment & Plan Pt suffers with chronic pain due to his bone mets. Take oxycodone PRN.    Dehydration  Assessment & Plan Secondary to chemo and dysphagia. Advised nutrition follow up.    Malignant pleural effusion  Assessment & Plan Increasing bilat pleural effusions noted on scans today. Pt refuses O2 while in [place] today; stating that he does not need it.    Accelerated hypertension  Assessment & Plan BP 134/96- most likely due to increased pain and worsening pleural effusions.   Hypoalbuminemia due to protein-calorie malnutrition  Assessment & Plan Secondary to dysphagia.  Pt encouraged to increase protein intake.  Will order nutrition consult.    Dysphagia  Assessment & Plan Secondary to both cancer dx.  Nutrition follow up will be ordered.    Anorexia  Assessment & Plan Secondary to chemo and dysphagia. Advised pt to try multiple small meals thru out day. Also needs to meet with nutritionist for further eval if possible.    Anemia, unspecified  Assessment & Plan Chemo induced anemia.  Will monitor closely.    Due to patient's complex issues and worsening pleural effusions- patient was admitted to hospital this afternoon. Dr. Earlie Server aware of all.   Patient will also have nutrition follow up while admitted  if possible.   Patient stated understanding of all instructions; and was in agreement with this plan of care. The patient knows to call the clinic with any problems, questions or concerns.   Total time spent with patient was greater than 70 minutes;  with greater than 75 percent of that time spent in face to face counseling regarding review of patient's restaging scans, reviewing his symptoms, arrangement of admission, and coordination of care and followup.  Drue Second, NP 07/12/2013

## 2013-07-12 NOTE — Progress Notes (Signed)
INITIAL NUTRITION ASSESSMENT  DOCUMENTATION CODES Per approved criteria  -Severe malnutrition in the context of chronic illness -Obesity Unspecified  Pt meets criteria for severe MALNUTRITION in the context of chronic illness as evidenced by PO intake <75% for > one month, 18.8% body weight loss in < 6 months.   INTERVENTION: -Continue to recommend Ensure Complete po TID, each supplement provides 350 kcal and 13 grams of protein -Recommend for pt to trial [employer] and [employer] with meals -Provided pt with "High Protein, High Calorie Nutrition Therapy" handout -Provided pt with nutrition supplement coupons -Will continue to monitor  NUTRITION DIAGNOSIS: Inadequate oral intake related to difficulty swallowing as evidenced by PO intake <75% 18.8% body weight loss in < 6 months.   Goal: Pt to meet >/= 90% of their estimated nutrition needs    Monitor:  Total protein/energy intake, labs, weights, swallow profile  Reason for Assessment: MST  43 y.o. male  Admitting Dx: Malignant pleural effusion  ASSESSMENT: 43 year old male with history of esophageal adenocarcinoma with osseous, pulmonary, hepatic and peritoneal metastases undergoing chemotherapy and radiation therapy was sent from [facility] for symptoms of progressive shortness of breath on exertion for past 2-3 days. Patient had scheduled CT scan of the chest and abdomen to evaluate response to chemotherapy and short significant bilateral large pleural effusions with compressive atelectasis  -Pt reported ongoing decreased appetite, and weight loss since cancer dx in 02/2013 -Has lost 50-60 lbs in past 5 months (18.8% body weight loss, severe for time frame) -Diet recall indicates pt consuming largely liquid diets with some soft foods d/t esophageal cancer. Drinks 1-2 Ensure Complete daily, can tolerate soups, some cereals, ice cream/popsicles.  -Encouraged pt to increase to Ensure Complete TID. Discussed  additional protein supplements ([employer], [employer], whey protein powder) for pt to implement both inpatient and once stable for d/c -Provided pt with nutrition education handouts, recipes, and supplement coupons. Educated on ways to incorporate high kcal/protein foods into diets -Current PO intake is minimal, <50%. Was attempting to consume cream soup; however noted the texture to be too thick. -Ensure Complete BID ordered, will increase to TID  Height: Ht Readings from Last 1 Encounters:  07/11/13 6' (1.829 m)    Weight: Wt Readings from Last 1 Encounters:  07/11/13 225 lb 12 oz (102.4 kg)    Ideal Body Weight: 178 lbs  % Ideal Body Weight: 126%  Wt Readings from Last 10 Encounters:  07/11/13 225 lb 12 oz (102.4 kg)  07/11/13 223 lb 3.2 oz (101.243 kg)  07/04/13 219 lb 9.6 oz (99.61 kg)  06/21/13 220 lb 6.4 oz (99.973 kg)  05/22/13 230 lb 14.4 oz (104.736 kg)  04/29/13 234 lb (106.142 kg)  04/08/13 246 lb 14.4 oz (111.993 kg)  04/08/13 247 lb 6.4 oz (112.22 kg)  03/11/13 247 lb 11.2 oz (112.356 kg)  02/15/13 277 lb 9 oz (125.9 kg)    Usual Body Weight: 275-285 lbs  % Usual Body Weight: 82%  BMI:  Body mass index is 30.61 kg/(m^2).  Estimated Nutritional Needs: Kcal: 6237-6283 Protein: 105-120 gram Fluid: >/=2300 ml/daily  Skin: WDL  Diet Order: General  EDUCATION NEEDS: -Education needs addressed   Intake/Output Summary (Last 24 hours) at 07/12/13 1410 Last data filed at 07/12/13 1300  Gross per 24 hour  Intake    280 ml  Output    460 ml  Net   -180 ml    Last BM: 7/09   Labs:   Recent Labs  Lab 07/11/13 1606 07/11/13 1835  NA 130*  --   K 4.4  --   CO2 23  --   BUN 7.5  --   CREATININE 0.7  --   CALCIUM 8.8  --   MG  --  1.2*  GLUCOSE 99  --     CBG (last 3)  No results found for this basename: GLUCAP,  in the last 72 hours  Scheduled Meds: . enoxaparin (LOVENOX) injection  50 mg Subcutaneous Q24H  . feeding  supplement (ENSURE COMPLETE)  237 mL Oral BID BM  . furosemide  20 mg Intravenous Once  . gabapentin  100 mg Oral TID  . ipratropium-albuterol  3 mL Nebulization Q6H  . morphine  30 mg Oral Q12H  . sodium chloride  3 mL Intravenous Q12H    Continuous Infusions:   Past Medical History  Diagnosis Date  . GERD (gastroesophageal reflux disease)     cannot taste anything  . 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 No pertinent past surgical history.  Atlee Abide MS RD LDN Clinical Dietitian [phone]

## 2013-07-12 NOTE — Assessment & Plan Note (Signed)
Secondary to chemo and dysphagia. Advised nutrition follow up.

## 2013-07-12 NOTE — Progress Notes (Signed)
[facility] PROGRESS NOTE   Fred [name] YBO:[id number] DOB: Jul 08, 1970 DOA: 07/11/2013 PCP: [name], MD  HPI/Subjective: Very anxious prior to procedure, seen by medical and radiation oncology.  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  O2 sat maintained on room air. Continue oxygen via nasal cannula when necessary and scheduled duoneb as well as when necessary albuterol neb. Patient is tachycardic with elevated blood pressure on presentation. He needs therapeutic bilateral thoracenteses which can be done in am. Monitor for respiratory distress.  -Check proBNP and I/O. No signs of infiltrate on imaging.  -Patient has esophageal adenocarcinoma with pulmonary, hepatic, osseous and peritoneal metastases. Patient is currently undergoing chemotherapy and radiation therapy. His oncologist Dr. Julien Nordmann will follow patient while in the hospital.  -Resume home pain medications which includes MS Contin 30 mg twice a day and oxycodone 50 mg every 6 hours as needed.  -This is likely malignant pleural effusion, the fluid color is red, await cytology results.  GERD  Continue PPI   Elevated blood pressure  No history of underlying hypertension. I will place him on when necessary hydralazine.   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  Asymptomatic with normal renal function.   Hyponatremia  Possibly dehydration vs SIADH. Will send urine lytes   Code Status: Full code Family Communication: Plan discussed with the patient. Disposition Plan: Remains  inpatient   Consultants:  None  Procedures:  None  Antibiotics:  None   Objective: Filed Vitals:   07/12/13 1154  BP: 133/81  Pulse: 115  Temp:   Resp:     Intake/Output Summary (Last 24 hours) at 07/12/13 1253 Last data filed at 07/12/13 0700  Gross per 24 hour  Intake      0 ml  Output    460 ml  Net   -460 ml   Filed Weights   07/11/13 2026 07/11/13 2047  Weight: 101.243 kg (223 lb 3.2 oz) 102.4 kg (225 lb 12 oz)    Exam: General: Alert and awake, oriented x3, not in any acute distress. HEENT: anicteric sclera, pupils reactive to light and accommodation, EOMI CVS: S1-S2 clear, no murmur rubs or gallops Chest: clear to auscultation bilaterally, no wheezing, rales or rhonchi Abdomen: soft nontender, nondistended, normal bowel sounds, no organomegaly Extremities: no cyanosis, clubbing or edema noted bilaterally Neuro: Cranial nerves II-XII intact, no focal neurological deficits  Data Reviewed: Basic Metabolic Panel:  Recent Labs Lab 07/11/13 1606 07/11/13 1835  NA 130*  --   K 4.4  --   CO2 23  --   GLUCOSE 99  --   BUN 7.5  --   CREATININE 0.7  --   CALCIUM 8.8  --   MG  --  1.2*   Liver Function Tests:  Recent Labs Lab 07/11/13 1606 07/11/13 1835  AST 27 25  ALT 20 16  ALKPHOS 93 91  BILITOT 0.75 0.5  PROT 6.3* 5.7*  ALBUMIN 3.0* 2.7*   No results found for this basename: LIPASE, AMYLASE,  in the last 168 hours No results found for this basename: AMMONIA,  in the last 168 hours CBC:  Recent Labs Lab 07/11/13 1604 07/11/13 1835 07/12/13 0509  WBC 3.6* 2.4* 2.9*  NEUTROABS 2.3 1.4*  --   HGB 9.0* 8.9* 8.0*  HCT 27.0* 25.5* 23.1*  MCV 89.3 85.3 87.5  PLT 238 170 176   Cardiac Enzymes:  Recent Labs Lab 07/11/13 1605 07/11/13 1606  CKTOTAL 37  --   CKMB 1.9  --   TROPONINI  --  <0.30   BNP (last 3 results)  Recent Labs  07/11/13 1830  PROBNP 111.5   CBG: No results found for this basename: GLUCAP,  in the last  168 hours  Micro No results found for this or any previous visit (from the past 240 hour(s)).   Studies: Ct Chest W Contrast  07/11/2013   CLINICAL DATA:  Esophageal cancer air with metastasis.  EXAM: CT CHEST, ABDOMEN, AND PELVIS WITH CONTRAST  TECHNIQUE: Multidetector CT imaging of the chest, abdomen and pelvis was performed following the standard protocol during bolus administration of intravenous contrast.  CONTRAST:  19mL OMNIPAQUE IOHEXOL 300 MG/ML  SOLN  COMPARISON:  Chest CT 05/20/2013 and PET-CT 02/07/2013  FINDINGS: CT CHEST FINDINGS  The chest wall is unremarkable and stable. No chest wall lesions or axillary adenopathy. The right Port-A-Cath is stable. There are enlarged left-sided supraclavicular lymph nodes which appears stable. Maximum transverse measurement on image number 4 is 29 mm and was previously 27 mm. The thyroid gland is normal. The lymph nodes noted in the subpectoral region are stable also.  Diffuse osseous metastatic disease is again demonstrated. I do not see any significant interval change when compared to most recent prior CT scan. Stable thoracic spine decompression and fusion.  The heart is normal in size. No pericardial effusion. Significant progression of mediastinal lymphadenopathy with aorticopulmonary window nodal mass measuring a maximum of 2.3 cm. The esophageal mass is difficult to measure but overall appears markedly enlarged. Adjacent adenopathy is noted. There is significant soft tissue surrounding the region of the esophagus and aorta which is worrisome for markedly progressive tumor. There are also large bilateral pleural effusions with significant compressive atelectasis of both lungs. There is an enhancing pleural nodule in the right lower lobe on image number 35.  No obvious pulmonary nodules in the aerated aspects of both lungs.  CT ABDOMEN AND PELVIS FINDINGS  Hepatic metastatic disease is again demonstrated. The largest lesion in the right hepatic lobe on  image number 59 measures 2.7 x 2.2 cm and previously measured 2.3 x 2.0 cm. The lesion at the hepatic dome on image number 52 measures 3.5 x 2.7 cm and previously measured 2.3 x 2.3 cm. Possible small new lesions involving the medial segment of the left hepatic lobe.  The gallbladder is normal. No common bile duct dilatation. The pancreas is normal. The spleen is normal. The adrenal glands and kidneys are unremarkable and stable. 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  New and progressive peritoneal implants are noted. The Large irregular mass near the spleen is slightly progressive. There is a new right paraspinal mass on image number 68 which measures 2.5 x 2.4 cm. A smaller left paraspinal mass is noted on the left.  The aorta and branch vessels are patent. 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 Small scattered retroperitoneal lymph nodes.  The bladder, prostate gland and seminal vesicles are unremarkable. No pelvic mass or adenopathy. No inguinal mass or adenopathy.  Stable osseous metastatic disease involving the spine and pelvis.  IMPRESSION: 1. Overall stable left supraclavicular lymphadenopathy  appear 2. Significant progression of mediastinal lymphadenopathy and tumor surrounding the esophagus and aorta. 3. Large bilateral pleural effusions with significant compressive atelectasis of both lungs. There is an enhancing pleural nodule in the right lower lobe on image number 35. 4. Progressive hepatic metastatic disease. 5. Progressive peritoneal implants in the abdomen. 6. Stable diffuse osseous metastatic disease.   Electronically Signed   By: Kalman Jewels M.D.   On: 07/11/2013 13:33   Ct Abdomen Pelvis W Contrast  07/11/2013   CLINICAL DATA:  Esophageal cancer air with metastasis.  EXAM: CT CHEST, ABDOMEN, AND PELVIS WITH CONTRAST  TECHNIQUE: Multidetector CT imaging of the chest, abdomen and pelvis was performed following the standard protocol during bolus administration of  intravenous contrast.  CONTRAST:  13mL OMNIPAQUE IOHEXOL 300 MG/ML  SOLN  COMPARISON:  Chest CT 05/20/2013 and PET-CT 02/07/2013  FINDINGS: CT CHEST FINDINGS  The chest wall is unremarkable and stable. No chest wall lesions or axillary adenopathy. The right Port-A-Cath is stable. There are enlarged left-sided supraclavicular lymph nodes which appears stable. Maximum transverse measurement on image number 4 is 29 mm and was previously 27 mm. The thyroid gland is normal. The lymph nodes noted in the subpectoral region are stable also.  Diffuse osseous metastatic disease is again demonstrated. I do not see any significant interval change when compared to most recent prior CT scan. Stable thoracic spine decompression and fusion.  The heart is normal in size. No pericardial effusion. Significant progression of mediastinal lymphadenopathy with aorticopulmonary window nodal mass measuring a maximum of 2.3 cm. The esophageal mass is difficult to measure but overall appears markedly enlarged. Adjacent adenopathy is noted. There is significant soft tissue surrounding the region of the esophagus and aorta which is worrisome for markedly progressive tumor. There are also large bilateral pleural effusions with significant compressive atelectasis of both lungs. There is an enhancing pleural nodule in the right lower lobe on image number 35.  No obvious pulmonary nodules in the aerated aspects of both lungs.  CT ABDOMEN AND PELVIS FINDINGS  Hepatic metastatic disease is again demonstrated. The largest lesion in the right hepatic lobe on image number 59 measures 2.7 x 2.2 cm and previously measured 2.3 x 2.0 cm. The lesion at the hepatic dome on image number 52 measures 3.5 x 2.7 cm and previously measured 2.3 x 2.3 cm. Possible small new lesions involving the medial segment of the left hepatic lobe.  The gallbladder is normal. No common bile duct dilatation. The pancreas is normal. The spleen is normal. The adrenal glands and  kidneys are unremarkable and stable. 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  New and progressive peritoneal implants are noted. The Large irregular mass near the spleen is slightly progressive. There is a new right paraspinal mass on image number 68 which measures 2.5 x 2.4 cm. A smaller left paraspinal mass is noted on the left.  The aorta and branch vessels are patent. 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 Small scattered retroperitoneal lymph nodes.  The bladder, prostate gland and seminal vesicles are unremarkable. No pelvic mass or adenopathy. No inguinal mass or adenopathy.  Stable osseous metastatic disease involving the spine and pelvis.  IMPRESSION: 1. Overall stable left supraclavicular lymphadenopathy appear 2. Significant progression of mediastinal lymphadenopathy and tumor surrounding the esophagus and aorta. 3. Large bilateral pleural effusions with significant compressive atelectasis of both lungs. There is an enhancing pleural nodule in the right lower lobe on image number 35. 4. Progressive  hepatic metastatic disease. 5. Progressive peritoneal implants in the abdomen. 6. Stable diffuse osseous metastatic disease.   Electronically Signed   By: Kalman Jewels M.D.   On: 07/11/2013 13:33   Dg Chest Port 1 View  07/12/2013   CLINICAL DATA:  Status post right-sided thoracentesis  EXAM: PORTABLE CHEST - 1 VIEW  COMPARISON:  CT scan of the chest of July 11, 2013 and of the ultrasound of the thorax of today's date  FINDINGS: There is bilateral hypo inflation. There is a moderate-sized right pleural effusion with smaller left pleural effusion. There is no postprocedure pneumothorax. The cardiac silhouette is enlarged where visualized. There is soft tissue fullness in the right paratracheal region the pulmonary vascularity is prominent bilaterally. The power port catheter tip lies in the region of the mid SVC but is partially obscured by a previous spinal fusion appliances.   IMPRESSION: There is no postprocedure pneumothorax following right-sided thoracentesis. A moderate size right pleural effusion persists.   Electronically Signed   By: David  Martinique   On: 07/12/2013 10:38    Scheduled Meds: . enoxaparin (LOVENOX) injection  50 mg Subcutaneous Q24H  . feeding supplement (ENSURE COMPLETE)  237 mL Oral BID BM  . gabapentin  100 mg Oral TID  . ipratropium-albuterol  3 mL Nebulization Q6H  . morphine  30 mg Oral Q12H  . sodium chloride  3 mL Intravenous Q12H   Continuous Infusions: . sodium chloride         Time spent: 35 minutes    Jene Oravec A  [facility] Pager [phone] If 7PM-7AM, please contact night-coverage at [email or web address], password [facility] 07/12/2013, 12:53 PM  LOS: 1 day

## 2013-07-12 NOTE — Assessment & Plan Note (Signed)
Secondary to dysphagia.  Pt encouraged to increase protein intake.  Will order nutrition consult.

## 2013-07-12 NOTE — Assessment & Plan Note (Signed)
Pt suffers with chronic pain due to his bone mets. Take oxycodone PRN.

## 2013-07-12 NOTE — Assessment & Plan Note (Signed)
BP 134/96- most likely due to increased pain and worsening pleural effusions.

## 2013-07-12 NOTE — Procedures (Signed)
Successful US guided right thoracentesis. Yielded 1.4 liters of dark bloody fluid. Pt tolerated procedure well. No immediate complications.  Specimen was sent for labs. CXR ordered.  Tsosie Billing D PA-C 07/12/2013 10:12 AM

## 2013-07-12 NOTE — Progress Notes (Signed)
Assumed care of patient from previous RN.  I agree with prior assessment and will continue to monitor patient. Owens Shark, Klint Lezcano Cherie

## 2013-07-12 NOTE — Assessment & Plan Note (Signed)
Secondary to both cancer dx.  Nutrition follow up will be ordered.

## 2013-07-12 NOTE — Progress Notes (Signed)
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   MOLECULAR BIOMARKERS: Foundation one: Amplification of PIK3CA, RAF1, CCND1, MYC(equivocal), PRKCl, SOX2, EPHB1, FGF19, FGF3, FGF4, GATA6, TERC and [id number], [id number].   PRIOR THERAPY:  1) status post T8-T10 spinal fusion under the care of Dr. Vertell Limber.  2) status post a stereotactic radiotherapy to the T9 lesion under the care of Dr. [name].  3) Systemic chemotherapy with EOX, (epirubicin, oxaliplatin and Xeloda) every 3 weeks. First dose on 03/18/2013, status post 3 cycles discontinued today secondary to disease progression.   CURRENT THERAPY: systemic chemotherapy with cisplatin 30 mg/M2 and irinotecan 65 mg/M2 days 1 and 8 every 3 weeks. First cycle expected on 05/31/2013. Status post one cycle.   Advanced directives: The patient does not have advanced directives and he was given information package.   Subjective: The patient is seen and examined today. His wife was at the bedside. He was admitted yesterday with significant weakness and fatigue in addition to shortness of breath. Recent restaging scan of the chest, abdomen and pelvis showed evidence for disease progression but there was also large bilateral pleural effusion. He is feeling a little bit better today but continues to have shortness of breath. He is scheduled for bilateral ultrasound-guided thoracentesis today. He has no fever or chills.  Objective: Vital signs in last 24 hours: Temp:  [98.2 F (36.8 C)-98.4 F (36.9 C)] 98.3 F (36.8 C) (07/10 0608) Pulse Rate:  [124-141] 124 (07/10 0608) Resp:  [20-32] 24 (07/10 0608) BP: (133-147)/(91-106) 133/91 mmHg (07/10 0608) SpO2:  [96 %-99 %] 99 % (07/10 0638) Weight:  [223 lb 3.2 oz (101.243 kg)-225 lb 12 oz (102.4 kg)] 225 lb 12 oz (102.4 kg) (07/09 2047)  Intake/Output from previous day: 07/09 0701 - 07/10 0700 In: 0  Out: 460 [Urine:460] Intake/Output this shift:     General appearance: alert, cooperative, fatigued and no distress Resp: diminished breath sounds LLL and RLL and dullness to percussion LLL and RLL Cardio: regular rate and rhythm, S1, S2 normal, no murmur, click, rub or gallop GI: soft, non-tender; bowel sounds normal; no masses,  no organomegaly Extremities: extremities normal, atraumatic, no cyanosis or edema  Lab Results:   Recent Labs  07/11/13 1835 07/12/13 0509  WBC 2.4* 2.9*  HGB 8.9* 8.0*  HCT 25.5* 23.1*  PLT 170 176   BMET  Recent Labs  07/11/13 1606  NA 130*  K 4.4  CO2 23  GLUCOSE 99  BUN 7.5  CREATININE 0.7  CALCIUM 8.8    Studies/Results: Ct Chest W Contrast  07/11/2013   CLINICAL DATA:  Esophageal cancer air with metastasis.  EXAM: CT CHEST, ABDOMEN, AND PELVIS WITH CONTRAST  TECHNIQUE: Multidetector CT imaging of the chest, abdomen and pelvis was performed following the standard protocol during bolus administration of intravenous contrast.  CONTRAST:  177m OMNIPAQUE IOHEXOL 300 MG/ML  SOLN  COMPARISON:  Chest CT 05/20/2013 and PET-CT 02/07/2013  FINDINGS: CT CHEST FINDINGS  The chest wall is unremarkable and stable. No chest wall lesions or axillary adenopathy. The right Port-A-Cath is stable. There are enlarged left-sided supraclavicular lymph nodes which appears stable. Maximum transverse measurement on image number 4 is 29 mm and was previously 27 mm. The thyroid gland is normal. The lymph nodes noted in the subpectoral region are stable also.  Diffuse osseous metastatic disease is again demonstrated. I do not see any significant interval change when compared to most recent prior CT scan. Stable thoracic spine decompression and fusion.  The heart is normal in size. No pericardial effusion. Significant progression of mediastinal lymphadenopathy with aorticopulmonary window nodal mass measuring a maximum of 2.3 cm. The esophageal mass is difficult to measure but overall appears markedly enlarged. Adjacent  adenopathy is noted. There is significant soft tissue surrounding the region of the esophagus and aorta which is worrisome for markedly progressive tumor. There are also large bilateral pleural effusions with significant compressive atelectasis of both lungs. There is an enhancing pleural nodule in the right lower lobe on image number 35.  No obvious pulmonary nodules in the aerated aspects of both lungs.  CT ABDOMEN AND PELVIS FINDINGS  Hepatic metastatic disease is again demonstrated. The largest lesion in the right hepatic lobe on image number 59 measures 2.7 x 2.2 cm and previously measured 2.3 x 2.0 cm. The lesion at the hepatic dome on image number 52 measures 3.5 x 2.7 cm and previously measured 2.3 x 2.3 cm. Possible small new lesions involving the medial segment of the left hepatic lobe.  The gallbladder is normal. No common bile duct dilatation. The pancreas is normal. The spleen is normal. The adrenal glands and kidneys are unremarkable and stable. 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  New and progressive peritoneal implants are noted. The Large irregular mass near the spleen is slightly progressive. There is a new right paraspinal mass on image number 68 which measures 2.5 x 2.4 cm. A smaller left paraspinal mass is noted on the left.  The aorta and branch vessels are patent. 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 Small scattered retroperitoneal lymph nodes.  The bladder, prostate gland and seminal vesicles are unremarkable. No pelvic mass or adenopathy. No inguinal mass or adenopathy.  Stable osseous metastatic disease involving the spine and pelvis.  IMPRESSION: 1. Overall stable left supraclavicular lymphadenopathy appear 2. Significant progression of mediastinal lymphadenopathy and tumor surrounding the esophagus and aorta. 3. Large bilateral pleural effusions with significant compressive atelectasis of both lungs. There is an enhancing pleural nodule in the right lower lobe  on image number 35. 4. Progressive hepatic metastatic disease. 5. Progressive peritoneal implants in the abdomen. 6. Stable diffuse osseous metastatic disease.   Electronically Signed   By: Kalman Jewels M.D.   On: 07/11/2013 13:33   Ct Abdomen Pelvis W Contrast  07/11/2013   CLINICAL DATA:  Esophageal cancer air with metastasis.  EXAM: CT CHEST, ABDOMEN, AND PELVIS WITH CONTRAST  TECHNIQUE: Multidetector CT imaging of the chest, abdomen and pelvis was performed following the standard protocol during bolus administration of intravenous contrast.  CONTRAST:  126m OMNIPAQUE IOHEXOL 300 MG/ML  SOLN  COMPARISON:  Chest CT 05/20/2013 and PET-CT 02/07/2013  FINDINGS: CT CHEST FINDINGS  The chest wall is unremarkable and stable. No chest wall lesions or axillary adenopathy. The right Port-A-Cath is stable. There are enlarged left-sided supraclavicular lymph nodes which appears stable. Maximum transverse measurement on image number 4 is 29 mm and was previously 27 mm. The thyroid gland is normal. The lymph nodes noted in the subpectoral region are stable also.  Diffuse osseous metastatic disease is again demonstrated. I do not see any significant interval change when compared to most recent prior CT scan. Stable thoracic spine decompression and fusion.  The heart is normal in size. No pericardial effusion. Significant progression of mediastinal lymphadenopathy with aorticopulmonary window nodal mass measuring a maximum of 2.3 cm. The esophageal mass is difficult to measure but overall appears markedly enlarged. Adjacent adenopathy is noted. There is  significant soft tissue surrounding the region of the esophagus and aorta which is worrisome for markedly progressive tumor. There are also large bilateral pleural effusions with significant compressive atelectasis of both lungs. There is an enhancing pleural nodule in the right lower lobe on image number 35.  No obvious pulmonary nodules in the aerated aspects of both  lungs.  CT ABDOMEN AND PELVIS FINDINGS  Hepatic metastatic disease is again demonstrated. The largest lesion in the right hepatic lobe on image number 59 measures 2.7 x 2.2 cm and previously measured 2.3 x 2.0 cm. The lesion at the hepatic dome on image number 52 measures 3.5 x 2.7 cm and previously measured 2.3 x 2.3 cm. Possible small new lesions involving the medial segment of the left hepatic lobe.  The gallbladder is normal. No common bile duct dilatation. The pancreas is normal. The spleen is normal. The adrenal glands and kidneys are unremarkable and stable. 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  New and progressive peritoneal implants are noted. The Large irregular mass near the spleen is slightly progressive. There is a new right paraspinal mass on image number 68 which measures 2.5 x 2.4 cm. A smaller left paraspinal mass is noted on the left.  The aorta and branch vessels are patent. 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 Small scattered retroperitoneal lymph nodes.  The bladder, prostate gland and seminal vesicles are unremarkable. No pelvic mass or adenopathy. No inguinal mass or adenopathy.  Stable osseous metastatic disease involving the spine and pelvis.  IMPRESSION: 1. Overall stable left supraclavicular lymphadenopathy appear 2. Significant progression of mediastinal lymphadenopathy and tumor surrounding the esophagus and aorta. 3. Large bilateral pleural effusions with significant compressive atelectasis of both lungs. There is an enhancing pleural nodule in the right lower lobe on image number 35. 4. Progressive hepatic metastatic disease. 5. Progressive peritoneal implants in the abdomen. 6. Stable diffuse osseous metastatic disease.   Electronically Signed   By: Mark  Gallerani M.D.   On: 07/11/2013 13:33    Medications: I have reviewed the patient's current medications.  Assessment/Plan: 1) metastatic distal esophageal adenocarcinoma diagnosed in January of 2015  status post several treatments including stereotactic radiotherapy to T9 lesion in addition to systemic chemotherapy with EOX for 3 cycles with evidence for disease progression. He also had 2 cycles of systemic chemotherapy with cisplatin and irinotecan in the recent scan showed evidence for disease progression. I may consider the patient for second line chemotherapy with docetaxel and Cyramza after discharge from the hospital. 2) shortness of breath: Secondary to bilateral pleural effusion. The patient would benefit from ultrasound guided bilateral thoracenteses today. 3) fatigue and weakness: Secondary to his progressive disease in chemotherapy-induced anemia. He may benefit from 2 units of PRBCs transfusion before discharge. 4) dysphagia: Continue palliative radiotherapy to the distal esophageal mass. Thank you so much for taking good care of Mr. Muhlbauer, I will continue to follow up the patient with you and assist in his management on as-needed basis.    LOS: 1 day    MOHAMED,MOHAMED K. 07/12/2013    

## 2013-07-12 NOTE — Telephone Encounter (Signed)
Floor nurse, Radonna Ricker, caring for patient while on [place] to report that the patient is refusing treatment today despite being stable. She relayed that he stated he "is spent." Informed Elmyra Ricks, Amy, and Faith, RT on linac 2 of this finding. Will assess patient status again Monday, July 13th.

## 2013-07-12 NOTE — Assessment & Plan Note (Signed)
Secondary to chemo and dysphagia. Advised pt to try multiple small meals thru out day. Also needs to meet with nutritionist for further eval if possible.

## 2013-07-12 NOTE — Progress Notes (Signed)
Department of Radiation Oncology  Phone:  [phone] Fax:        [phone]  Weekly Treatment Note    Name: Fred [name] Date: 07/12/2013 MRN: [id number] DOB: [birth year]   Current dose: 14 Gy  Current fraction: 7   MEDICATIONS: No current facility-administered medications for this encounter.   No current outpatient prescriptions on file.   Facility-Administered Medications Ordered in Other Encounters  Medication Dose Route Frequency Provider Last Rate Last Dose  . 0.9 %  sodium chloride infusion   Intravenous Continuous Nishant Dhungel, MD      . acetaminophen (TYLENOL) tablet 650 mg  650 mg Oral Q6H PRN Nishant Dhungel, MD       Or  . acetaminophen (TYLENOL) suppository 650 mg  650 mg Rectal Q6H PRN Nishant Dhungel, MD      . albuterol (PROVENTIL) (2.5 MG/3ML) 0.083% nebulizer solution 2.5 mg  2.5 mg Nebulization Q2H PRN Nishant Dhungel, MD   2.5 mg at 07/12/13 6578  . enoxaparin (LOVENOX) injection 50 mg  50 mg Subcutaneous Q24H Nishant Dhungel, MD   50 mg at 07/11/13 2229  . feeding supplement (ENSURE COMPLETE) (ENSURE COMPLETE) liquid 237 mL  237 mL Oral BID BM Nishant Dhungel, MD      . gabapentin (NEURONTIN) capsule 100 mg  100 mg Oral TID Nishant Dhungel, MD   100 mg at 07/11/13 2228  . guaiFENesin-dextromethorphan (ROBITUSSIN DM) 100-10 MG/5ML syrup 5 mL  5 mL Oral Q4H PRN Nishant Dhungel, MD      . hydrALAZINE (APRESOLINE) injection 10 mg  10 mg Intravenous Q6H PRN Nishant Dhungel, MD      . HYDROmorphone (DILAUDID) injection 1 mg  1 mg Intravenous Q4H PRN Verlee Monte, MD   1 mg at 07/12/13 0924  . ipratropium-albuterol (DUONEB) 0.5-2.5 (3) MG/3ML nebulizer solution 3 mL  3 mL Nebulization Q6H Nishant Dhungel, MD   3 mL at 07/11/13 2101  . morphine (MS CONTIN) 12 hr tablet 30 mg  30 mg Oral Q12H Nishant Dhungel, MD   30 mg at 07/11/13 2228  . oxyCODONE (Oxy IR/ROXICODONE) immediate release tablet 5-10 mg  5-10 mg Oral Q6H PRN Dianne Dun, NP   10 mg at  07/12/13 4696  . prochlorperazine (COMPAZINE) tablet 10 mg  10 mg Oral Q6H PRN Nishant Dhungel, MD      . sodium chloride 0.9 % injection 10 mL  10 mL Intravenous PRN Curt Bears, MD   10 mL at 04/29/13 1105  . sodium chloride 0.9 % injection 10-40 mL  10-40 mL Intracatheter PRN Nishant Dhungel, MD      . sodium chloride 0.9 % injection 3 mL  3 mL Intravenous Q12H Nishant Dhungel, MD         ALLERGIES: Review of patient's allergies indicates no known allergies.   LABORATORY DATA:  Lab Results  Component Value Date   WBC 2.9* 07/12/2013   HGB 8.0* 07/12/2013   HCT 23.1* 07/12/2013   MCV 87.5 07/12/2013   PLT 176 07/12/2013   Lab Results  Component Value Date   NA 130* 07/11/2013   K 4.4 07/11/2013   CL 95* 02/11/2013   CO2 23 07/11/2013   Lab Results  Component Value Date   ALT 16 07/11/2013   AST 25 07/11/2013   ALKPHOS 91 07/11/2013   BILITOT 0.5 07/11/2013     NARRATIVE: Fred [name] was seen today for weekly treatment management. The chart was checked and the patient's films were reviewed. The  patient states he is doing fine with treatment. He is beginning to experience some esophagitis. He still has some Carafate from his prior course of treatment and we discussed using this if needed. His skin is doing well with no changes.  PHYSICAL EXAMINATION: Alert, in no acute distress        ASSESSMENT: The patient is doing satisfactorily with treatment.  PLAN: We will continue with the patient's radiation treatment as planned.

## 2013-07-13 ENCOUNTER — Inpatient Hospital Stay (HOSPITAL_COMMUNITY): Payer: BC Managed Care – PPO

## 2013-07-13 DIAGNOSIS — D649 Anemia, unspecified: Secondary | ICD-10-CM

## 2013-07-13 DIAGNOSIS — R1310 Dysphagia, unspecified: Secondary | ICD-10-CM

## 2013-07-13 DIAGNOSIS — E43 Unspecified severe protein-calorie malnutrition: Secondary | ICD-10-CM | POA: Diagnosis present

## 2013-07-13 LAB — BASIC METABOLIC PANEL
Anion gap: 14 (ref 5–15)
BUN: 12 mg/dL (ref 6–23)
CALCIUM: 8.8 mg/dL (ref 8.4–10.5)
CO2: 26 meq/L (ref 19–32)
CREATININE: 0.79 mg/dL (ref 0.50–1.35)
Chloride: 91 mEq/L — ABNORMAL LOW (ref 96–112)
GFR calc Af Amer: >90 mL/min (ref >90)
GFR calc non Af Amer: >90 mL/min (ref >90)
Glucose, Bld: 103 mg/dL — ABNORMAL HIGH (ref 70–99)
Potassium: 4.9 mEq/L (ref 3.7–5.3)
Sodium: 131 mEq/L — ABNORMAL LOW (ref 137–147)

## 2013-07-13 LAB — CBC
HCT: 30.3 % — ABNORMAL LOW (ref 39.0–52.0)
Hemoglobin: 10.4 g/dL — ABNORMAL LOW (ref 13.0–17.0)
MCH: 29.5 pg (ref 26.0–34.0)
MCHC: 34.3 g/dL (ref 30.0–36.0)
MCV: 86.1 fL (ref 78.0–100.0)
Platelets: 213 10*3/uL (ref 150–400)
RBC: 3.52 MIL/uL — ABNORMAL LOW (ref 4.22–5.81)
RDW: 18.1 % — ABNORMAL HIGH (ref 11.5–15.5)
WBC: 3.7 10*3/uL — ABNORMAL LOW (ref 4.0–10.5)

## 2013-07-13 MED ORDER — IPRATROPIUM-ALBUTEROL 0.5-2.5 (3) MG/3ML IN SOLN
3.0000 mL | Freq: Three times a day (TID) | RESPIRATORY_TRACT | Status: DC
  Administered 2013-07-14: 3 mL via RESPIRATORY_TRACT
  Filled 2013-07-13 (×2): qty 3

## 2013-07-13 MED ORDER — MAGNESIUM SULFATE 40 MG/ML IJ SOLN
2.0000 g | Freq: Once | INTRAMUSCULAR | Status: AC
  Administered 2013-07-13: 2 g via INTRAVENOUS
  Filled 2013-07-13: qty 50

## 2013-07-13 MED ORDER — FUROSEMIDE 10 MG/ML IJ SOLN
40.0000 mg | Freq: Once | INTRAMUSCULAR | Status: AC
  Administered 2013-07-13: 40 mg via INTRAVENOUS
  Filled 2013-07-13: qty 4

## 2013-07-13 MED ORDER — ALBUTEROL SULFATE (2.5 MG/3ML) 0.083% IN NEBU: 2.5000 mg | INHALATION_SOLUTION | RESPIRATORY_TRACT | Status: DC | PRN

## 2013-07-13 NOTE — Progress Notes (Signed)
Subjective: Fred [name] is seen and examined today. His wife was at the bedside. He is feeling a little bit better today after right-sided thoracentesis and transfusion of 2 units of PRBCs yesterday. He continues to have shortness of breath with exertion. He had a good night sleep. He denied having any significant fever or chills. He has no nausea or vomiting.  Objective: Vital signs in last 24 hours: Temp:  [97.9 F (36.6 C)-98.9 F (37.2 C)] 98.9 F (37.2 C) (07/11 0547) Pulse Rate:  [115-139] 124 (07/11 0547) Resp:  [16-20] 20 (07/11 0547) BP: (111-152)/(60-101) 116/79 mmHg (07/11 0547) SpO2:  [95 %-100 %] 97 % (07/11 0829)  Intake/Output from previous day: 07/10 0701 - 07/11 0700 In: 877.5 [P.O.:280; I.V.:250; Blood:347.5] Out: -  Intake/Output this shift:    General appearance: alert, cooperative, fatigued and no distress Resp: diminished breath sounds LLL and RLL and dullness to percussion LLL and RLL Cardio: regular rate and rhythm, S1, S2 normal, no murmur, click, rub or gallop GI: soft, non-tender; bowel sounds normal; no masses,  no organomegaly Extremities: extremities normal, atraumatic, no cyanosis or edema  Lab Results:   Recent Labs  07/12/13 0509 07/13/13 0520  WBC 2.9* 3.7*  HGB 8.0* 10.4*  HCT 23.1* 30.3*  PLT 176 213   BMET  Recent Labs  07/11/13 1606 07/13/13 0520  NA 130* 131*  K 4.4 4.9  CL  --  91*  CO2 23 26  GLUCOSE 99 103*  BUN 7.5 12  CREATININE 0.7 0.79  CALCIUM 8.8 8.8    Studies/Results: Ct Chest W Contrast  07/11/2013   CLINICAL DATA:  Esophageal cancer air with metastasis.  EXAM: CT CHEST, ABDOMEN, AND PELVIS WITH CONTRAST  TECHNIQUE: Multidetector CT imaging of the chest, abdomen and pelvis was performed following the standard protocol during bolus administration of intravenous contrast.  CONTRAST:  166mL OMNIPAQUE IOHEXOL 300 MG/ML  SOLN  COMPARISON:  Chest CT 05/20/2013 and PET-CT 02/07/2013  FINDINGS: CT CHEST FINDINGS  The chest  wall is unremarkable and stable. No chest wall lesions or axillary adenopathy. The right Port-A-Cath is stable. There are enlarged left-sided supraclavicular lymph nodes which appears stable. Maximum transverse measurement on image number 4 is 29 mm and was previously 27 mm. The thyroid gland is normal. The lymph nodes noted in the subpectoral region are stable also.  Diffuse osseous metastatic disease is again demonstrated. I do not see any significant interval change when compared to most recent prior CT scan. Stable thoracic spine decompression and fusion.  The heart is normal in size. No pericardial effusion. Significant progression of mediastinal lymphadenopathy with aorticopulmonary window nodal mass measuring a maximum of 2.3 cm. The esophageal mass is difficult to measure but overall appears markedly enlarged. Adjacent adenopathy is noted. There is significant soft tissue surrounding the region of the esophagus and aorta which is worrisome for markedly progressive tumor. There are also large bilateral pleural effusions with significant compressive atelectasis of both lungs. There is an enhancing pleural nodule in the right lower lobe on image number 35.  No obvious pulmonary nodules in the aerated aspects of both lungs.  CT ABDOMEN AND PELVIS FINDINGS  Hepatic metastatic disease is again demonstrated. The largest lesion in the right hepatic lobe on image number 59 measures 2.7 x 2.2 cm and previously measured 2.3 x 2.0 cm. The lesion at the hepatic dome on image number 52 measures 3.5 x 2.7 cm and previously measured 2.3 x 2.3 cm. Possible small new lesions involving  the medial segment of the left hepatic lobe.  The gallbladder is normal. No common bile duct dilatation. The pancreas is normal. The spleen is normal. The adrenal glands and kidneys are unremarkable and stable. 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  New and progressive peritoneal implants are noted. The Large  irregular mass near the spleen is slightly progressive. There is a new right paraspinal mass on image number 68 which measures 2.5 x 2.4 cm. A smaller left paraspinal mass is noted on the left.  The aorta and branch vessels are patent. 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 Small scattered retroperitoneal lymph nodes.  The bladder, prostate gland and seminal vesicles are unremarkable. No pelvic mass or adenopathy. No inguinal mass or adenopathy.  Stable osseous metastatic disease involving the spine and pelvis.  IMPRESSION: 1. Overall stable left supraclavicular lymphadenopathy appear 2. Significant progression of mediastinal lymphadenopathy and tumor surrounding the esophagus and aorta. 3. Large bilateral pleural effusions with significant compressive atelectasis of both lungs. There is an enhancing pleural nodule in the right lower lobe on image number 35. 4. Progressive hepatic metastatic disease. 5. Progressive peritoneal implants in the abdomen. 6. Stable diffuse osseous metastatic disease.   Electronically Signed   By: Kalman Jewels M.D.   On: 07/11/2013 13:33   Ct Abdomen Pelvis W Contrast  07/11/2013   CLINICAL DATA:  Esophageal cancer air with metastasis.  EXAM: CT CHEST, ABDOMEN, AND PELVIS WITH CONTRAST  TECHNIQUE: Multidetector CT imaging of the chest, abdomen and pelvis was performed following the standard protocol during bolus administration of intravenous contrast.  CONTRAST:  148mL OMNIPAQUE IOHEXOL 300 MG/ML  SOLN  COMPARISON:  Chest CT 05/20/2013 and PET-CT 02/07/2013  FINDINGS: CT CHEST FINDINGS  The chest wall is unremarkable and stable. No chest wall lesions or axillary adenopathy. The right Port-A-Cath is stable. There are enlarged left-sided supraclavicular lymph nodes which appears stable. Maximum transverse measurement on image number 4 is 29 mm and was previously 27 mm. The thyroid gland is normal. The lymph nodes noted in the subpectoral region are stable also.  Diffuse osseous metastatic  disease is again demonstrated. I do not see any significant interval change when compared to most recent prior CT scan. Stable thoracic spine decompression and fusion.  The heart is normal in size. No pericardial effusion. Significant progression of mediastinal lymphadenopathy with aorticopulmonary window nodal mass measuring a maximum of 2.3 cm. The esophageal mass is difficult to measure but overall appears markedly enlarged. Adjacent adenopathy is noted. There is significant soft tissue surrounding the region of the esophagus and aorta which is worrisome for markedly progressive tumor. There are also large bilateral pleural effusions with significant compressive atelectasis of both lungs. There is an enhancing pleural nodule in the right lower lobe on image number 35.  No obvious pulmonary nodules in the aerated aspects of both lungs.  CT ABDOMEN AND PELVIS FINDINGS  Hepatic metastatic disease is again demonstrated. The largest lesion in the right hepatic lobe on image number 59 measures 2.7 x 2.2 cm and previously measured 2.3 x 2.0 cm. The lesion at the hepatic dome on image number 52 measures 3.5 x 2.7 cm and previously measured 2.3 x 2.3 cm. Possible small new lesions involving the medial segment of the left hepatic lobe.  The gallbladder is normal. No common bile duct dilatation. The pancreas is normal. The spleen is normal. The adrenal glands and kidneys are unremarkable and stable. Stable lower pole right renal calculi and chronic  UPJ obstruction.  New and progressive peritoneal implants are noted. The Large irregular mass near the spleen is slightly progressive. There is a new right paraspinal mass on image number 68 which measures 2.5 x 2.4 cm. A smaller left paraspinal mass is noted on the left.  The aorta and branch vessels are patent. 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 Small scattered retroperitoneal lymph nodes.  The bladder, prostate gland and seminal vesicles are unremarkable. No pelvic mass or  adenopathy. No inguinal mass or adenopathy.  Stable osseous metastatic disease involving the spine and pelvis.  IMPRESSION: 1. Overall stable left supraclavicular lymphadenopathy appear 2. Significant progression of mediastinal lymphadenopathy and tumor surrounding the esophagus and aorta. 3. Large bilateral pleural effusions with significant compressive atelectasis of both lungs. There is an enhancing pleural nodule in the right lower lobe on image number 35. 4. Progressive hepatic metastatic disease. 5. Progressive peritoneal implants in the abdomen. 6. Stable diffuse osseous metastatic disease.   Electronically Signed   By: Kalman Jewels M.D.   On: 07/11/2013 13:33   Dg Chest Port 1 View  07/12/2013   CLINICAL DATA:  Status post right-sided thoracentesis  EXAM: PORTABLE CHEST - 1 VIEW  COMPARISON:  CT scan of the chest of July 11, 2013 and of the ultrasound of the thorax of today's date  FINDINGS: There is bilateral hypo inflation. There is a moderate-sized right pleural effusion with smaller left pleural effusion. There is no postprocedure pneumothorax. The cardiac silhouette is enlarged where visualized. There is soft tissue fullness in the right paratracheal region the pulmonary vascularity is prominent bilaterally. The power port catheter tip lies in the region of the mid SVC but is partially obscured by a previous spinal fusion appliances.  IMPRESSION: There is no postprocedure pneumothorax following right-sided thoracentesis. A moderate size right pleural effusion persists.   Electronically Signed   By: David  Martinique   On: 07/12/2013 10:38   US Thoracentesis Asp Pleural Space W/img Guide  07/12/2013   CLINICAL DATA:  Metastatic esophageal cancer, bilateral pleural effusions right greater than left, request for thoracentesis.  EXAM: ULTRASOUND GUIDED RIGHT DIAGNOSTIC AND THERAPEUTIC THORACENTESIS  COMPARISON:  None.  PROCEDURE: An ultrasound guided thoracentesis was thoroughly discussed with the  patient and questions answered. The benefits, risks, alternatives and complications were also discussed. The patient understands and wishes to proceed with the procedure. Written consent was obtained.  Ultrasound was performed to localize and mark an adequate pocket of fluid in the right chest. The area was then prepped and draped in the normal sterile fashion. 1% Lidocaine was used for local anesthesia. Under ultrasound guidance a 19 gauge Yueh catheter was introduced. Thoracentesis was performed. The catheter was removed and a dressing applied.  Complications:  None.  FINDINGS: A total of approximately 1.4 liters of dark bloody fluid was removed. A fluid sample was sent for laboratory analysis.  IMPRESSION: Successful ultrasound guided right thoracentesis yielding 1.4 liters of pleural fluid.  Read By:  Tsosie Billing PA-C   Electronically Signed   By: Aletta Edouard M.D.   On: 07/12/2013 11:11    Medications: I have reviewed the patient's current medications.  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 He also had 2 cycles of systemic chemotherapy with cisplatin and irinotecan in the recent scan showed evidence for disease progression.  I  may consider the patient for second line chemotherapy with docetaxel and Cyramza after discharge from the hospital.  2) shortness of breath: Secondary to bilateral pleural effusion. This is slightly improved after the right-sided thoracentesis. I will arrange for the patient to have left-sided ultrasound guided thoracentesis today. 3) chemotherapy-induced anemia. Improved after 2 units of PRBCs transfusion yesterday.  4) dysphagia: Continue palliative radiotherapy to the distal esophageal mass.  5) disposition: Expected discharged tomorrow if  stable. Thank you so much for taking good care of Fred [name], I will continue to follow up the patient with you and assist in his management on as-needed basis.    LOS: 2 days    Tadan Shill K. 07/13/2013

## 2013-07-13 NOTE — Progress Notes (Signed)
[facility] PROGRESS NOTE   Fred [name] QMG:[id number] DOB: December 27, 1970 DOA: 07/11/2013 PCP: [name], MD  HPI/Subjective: Had his right-sided thoracentesis yesterday with removal of 1.4 L, s/p transfusion of 2 units of packed RBCs. 4 left sided thoracentesis today.  Assessment/Plan: Principal Problem:   Malignant pleural effusion Active Problems:   Esophageal cancer   Metastatic cancer to spine   Accelerated hypertension   Hyponatremia  Malignant pleural effusion -This is likely malignant pleural effusion, the fluid color is red, await cytology results. -I will give 1 dose of IV Lasix, he will probably benefit from discharge on oral Lasix. -Likely needs evaluation by cardiothoracic surgery for Pleurx in the future, oncology please advise.  Metastatic esophageal adenocarcinoma with   CT scan with findings of progressive esophageal malignancy with large bilateral pleural effusion R>L with compressive atelectasis.  O2 sat maintained on room air. Continue oxygen via nasal cannula when necessary and scheduled duoneb as well as when necessary albuterol neb. Patient is tachycardic with elevated blood pressure on presentation. He needs therapeutic bilateral thoracenteses which can be done in am. Monitor for respiratory distress.  -Check proBNP and I/O. No signs of infiltrate on imaging.  -Patient has esophageal adenocarcinoma with pulmonary, hepatic, osseous and peritoneal metastases. Patient is currently undergoing chemotherapy and radiation therapy. His oncologist Dr. Julien Nordmann will follow patient while in the hospital.  -Resume home pain medications which includes MS Contin 30 mg twice a day and oxycodone 50 mg every 6 hours as needed.   GERD  Continue PPI   Elevated blood pressure  No history of underlying hypertension. I will place him on when necessary hydralazine.   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   Asymptomatic with normal renal function.   Hyponatremia  Possibly dehydration vs SIADH. Will send urine lytes   Code Status: Full code Family Communication: Plan discussed with the patient. Disposition Plan: Remains inpatient   Consultants:  None  Procedures:  None  Antibiotics:  None   Objective: Filed Vitals:   07/13/13 1118  BP: 118/92  Pulse:   Temp:   Resp:     Intake/Output Summary (Last 24 hours) at 07/13/13 1126 Last data filed at 07/12/13 2239  Gross per 24 hour  Intake  877.5 ml  Output      0 ml  Net  877.5 ml   Filed Weights   07/11/13 2026 07/11/13 2047  Weight: 101.243 kg (223 lb 3.2 oz) 102.4 kg (225 lb 12 oz)    Exam: General: Alert and awake, oriented x3, not in any acute distress. HEENT: anicteric sclera, pupils reactive to light and accommodation, EOMI CVS: S1-S2 clear, no murmur rubs or gallops Chest: clear to auscultation bilaterally, no wheezing, rales or rhonchi Abdomen: soft nontender, nondistended, normal bowel sounds, no organomegaly Extremities: no cyanosis, clubbing or edema noted bilaterally Neuro: Cranial nerves II-XII intact, no focal neurological deficits  Data Reviewed: Basic Metabolic Panel:  Recent Labs Lab 07/11/13 1606 07/11/13 1835 07/13/13 0520  NA 130*  --  131*  K 4.4  --  4.9  CL  --   --  91*  CO2 23  --  26  GLUCOSE 99  --  103*  BUN 7.5  --  12  CREATININE 0.7  --  0.79  CALCIUM 8.8  --  8.8  MG  --  1.2*  --    Liver Function Tests:  Recent Labs Lab 07/11/13 1606 07/11/13 1835  AST 27 25  ALT 20 16  ALKPHOS 93 91  BILITOT 0.75 0.5  PROT 6.3* 5.7*  ALBUMIN 3.0* 2.7*   No results found for this basename: LIPASE, AMYLASE,  in the last 168 hours No results found for this basename: AMMONIA,  in the last 168 hours CBC:  Recent Labs Lab 07/11/13 1604 07/11/13 1835 07/12/13 0509 07/13/13 0520  WBC 3.6* 2.4* 2.9* 3.7*  NEUTROABS 2.3 1.4*  --   --   HGB 9.0* 8.9* 8.0* 10.4*  HCT  27.0* 25.5* 23.1* 30.3*  MCV 89.3 85.3 87.5 86.1  PLT 238 170 176 213   Cardiac Enzymes:  Recent Labs Lab 07/11/13 1605 07/11/13 1606  CKTOTAL 37  --   CKMB 1.9  --   TROPONINI  --  <0.30   BNP (last 3 results)  Recent Labs  07/11/13 1830  PROBNP 111.5   CBG: No results found for this basename: GLUCAP,  in the last 168 hours  Micro No results found for this or any previous visit (from the past 240 hour(s)).   Studies: Ct Chest W Contrast  07/11/2013   CLINICAL DATA:  Esophageal cancer air with metastasis.  EXAM: CT CHEST, ABDOMEN, AND PELVIS WITH CONTRAST  TECHNIQUE: Multidetector CT imaging of the chest, abdomen and pelvis was performed following the standard protocol during bolus administration of intravenous contrast.  CONTRAST:  143mL OMNIPAQUE IOHEXOL 300 MG/ML  SOLN  COMPARISON:  Chest CT 05/20/2013 and PET-CT 02/07/2013  FINDINGS: CT CHEST FINDINGS  The chest wall is unremarkable and stable. No chest wall lesions or axillary adenopathy. The right Port-A-Cath is stable. There are enlarged left-sided supraclavicular lymph nodes which appears stable. Maximum transverse measurement on image number 4 is 29 mm and was previously 27 mm. The thyroid gland is normal. The lymph nodes noted in the subpectoral region are stable also.  Diffuse osseous metastatic disease is again demonstrated. I do not see any significant interval change when compared to most recent prior CT scan. Stable thoracic spine decompression and fusion.  The heart is normal in size. No pericardial effusion. Significant progression of mediastinal lymphadenopathy with aorticopulmonary window nodal mass measuring a maximum of 2.3 cm. The esophageal mass is difficult to measure but overall appears markedly enlarged. Adjacent adenopathy is noted. There is significant soft tissue surrounding the region of the esophagus and aorta which is worrisome for markedly progressive tumor. There are also large bilateral pleural effusions  with significant compressive atelectasis of both lungs. There is an enhancing pleural nodule in the right lower lobe on image number 35.  No obvious pulmonary nodules in the aerated aspects of both lungs.  CT ABDOMEN AND PELVIS FINDINGS  Hepatic metastatic disease is again demonstrated. The largest lesion in the right hepatic lobe on image number 59 measures 2.7 x 2.2 cm and previously measured 2.3 x 2.0 cm. The lesion at the hepatic dome on image number 52 measures 3.5 x 2.7 cm and previously measured 2.3 x 2.3 cm. Possible small new lesions involving the medial segment of the left hepatic lobe.  The gallbladder is normal. No common bile duct dilatation. The pancreas is normal. The spleen is normal. The adrenal glands and kidneys are unremarkable and stable. 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  New and progressive peritoneal implants are noted. The Large irregular mass near the spleen is slightly progressive. There is a new right paraspinal mass on image number 68 which measures 2.5 x 2.4 cm. A smaller left  paraspinal mass is noted on the left.  The aorta and branch vessels are patent. 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 Small scattered retroperitoneal lymph nodes.  The bladder, prostate gland and seminal vesicles are unremarkable. No pelvic mass or adenopathy. No inguinal mass or adenopathy.  Stable osseous metastatic disease involving the spine and pelvis.  IMPRESSION: 1. Overall stable left supraclavicular lymphadenopathy appear 2. Significant progression of mediastinal lymphadenopathy and tumor surrounding the esophagus and aorta. 3. Large bilateral pleural effusions with significant compressive atelectasis of both lungs. There is an enhancing pleural nodule in the right lower lobe on image number 35. 4. Progressive hepatic metastatic disease. 5. Progressive peritoneal implants in the abdomen. 6. Stable diffuse osseous metastatic disease.   Electronically Signed   By: Kalman Jewels  M.D.   On: 07/11/2013 13:33   Ct Abdomen Pelvis W Contrast  07/11/2013   CLINICAL DATA:  Esophageal cancer air with metastasis.  EXAM: CT CHEST, ABDOMEN, AND PELVIS WITH CONTRAST  TECHNIQUE: Multidetector CT imaging of the chest, abdomen and pelvis was performed following the standard protocol during bolus administration of intravenous contrast.  CONTRAST:  163mL OMNIPAQUE IOHEXOL 300 MG/ML  SOLN  COMPARISON:  Chest CT 05/20/2013 and PET-CT 02/07/2013  FINDINGS: CT CHEST FINDINGS  The chest wall is unremarkable and stable. No chest wall lesions or axillary adenopathy. The right Port-A-Cath is stable. There are enlarged left-sided supraclavicular lymph nodes which appears stable. Maximum transverse measurement on image number 4 is 29 mm and was previously 27 mm. The thyroid gland is normal. The lymph nodes noted in the subpectoral region are stable also.  Diffuse osseous metastatic disease is again demonstrated. I do not see any significant interval change when compared to most recent prior CT scan. Stable thoracic spine decompression and fusion.  The heart is normal in size. No pericardial effusion. Significant progression of mediastinal lymphadenopathy with aorticopulmonary window nodal mass measuring a maximum of 2.3 cm. The esophageal mass is difficult to measure but overall appears markedly enlarged. Adjacent adenopathy is noted. There is significant soft tissue surrounding the region of the esophagus and aorta which is worrisome for markedly progressive tumor. There are also large bilateral pleural effusions with significant compressive atelectasis of both lungs. There is an enhancing pleural nodule in the right lower lobe on image number 35.  No obvious pulmonary nodules in the aerated aspects of both lungs.  CT ABDOMEN AND PELVIS FINDINGS  Hepatic metastatic disease is again demonstrated. The largest lesion in the right hepatic lobe on image number 59 measures 2.7 x 2.2 cm and previously measured 2.3 x 2.0  cm. The lesion at the hepatic dome on image number 52 measures 3.5 x 2.7 cm and previously measured 2.3 x 2.3 cm. Possible small new lesions involving the medial segment of the left hepatic lobe.  The gallbladder is normal. No common bile duct dilatation. The pancreas is normal. The spleen is normal. The adrenal glands and kidneys are unremarkable and stable. 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  New and progressive peritoneal implants are noted. The Large irregular mass near the spleen is slightly progressive. There is a new right paraspinal mass on image number 68 which measures 2.5 x 2.4 cm. A smaller left paraspinal mass is noted on the left.  The aorta and branch vessels are patent. 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 Small scattered retroperitoneal lymph nodes.  The bladder, prostate gland and seminal vesicles are unremarkable. No pelvic mass or adenopathy. No inguinal  mass or adenopathy.  Stable osseous metastatic disease involving the spine and pelvis.  IMPRESSION: 1. Overall stable left supraclavicular lymphadenopathy appear 2. Significant progression of mediastinal lymphadenopathy and tumor surrounding the esophagus and aorta. 3. Large bilateral pleural effusions with significant compressive atelectasis of both lungs. There is an enhancing pleural nodule in the right lower lobe on image number 35. 4. Progressive hepatic metastatic disease. 5. Progressive peritoneal implants in the abdomen. 6. Stable diffuse osseous metastatic disease.   Electronically Signed   By: Kalman Jewels M.D.   On: 07/11/2013 13:33   Dg Chest Port 1 View  07/12/2013   CLINICAL DATA:  Status post right-sided thoracentesis  EXAM: PORTABLE CHEST - 1 VIEW  COMPARISON:  CT scan of the chest of July 11, 2013 and of the ultrasound of the thorax of today's date  FINDINGS: There is bilateral hypo inflation. There is a moderate-sized right pleural effusion with smaller left pleural effusion. There is no  postprocedure pneumothorax. The cardiac silhouette is enlarged where visualized. There is soft tissue fullness in the right paratracheal region the pulmonary vascularity is prominent bilaterally. The power port catheter tip lies in the region of the mid SVC but is partially obscured by a previous spinal fusion appliances.  IMPRESSION: There is no postprocedure pneumothorax following right-sided thoracentesis. A moderate size right pleural effusion persists.   Electronically Signed   By: David  Martinique   On: 07/12/2013 10:38   US Thoracentesis Asp Pleural Space W/img Guide  07/12/2013   CLINICAL DATA:  Metastatic esophageal cancer, bilateral pleural effusions right greater than left, request for thoracentesis.  EXAM: ULTRASOUND GUIDED RIGHT DIAGNOSTIC AND THERAPEUTIC THORACENTESIS  COMPARISON:  None.  PROCEDURE: An ultrasound guided thoracentesis was thoroughly discussed with the patient and questions answered. The benefits, risks, alternatives and complications were also discussed. The patient understands and wishes to proceed with the procedure. Written consent was obtained.  Ultrasound was performed to localize and mark an adequate pocket of fluid in the right chest. The area was then prepped and draped in the normal sterile fashion. 1% Lidocaine was used for local anesthesia. Under ultrasound guidance a 19 gauge Yueh catheter was introduced. Thoracentesis was performed. The catheter was removed and a dressing applied.  Complications:  None.  FINDINGS: A total of approximately 1.4 liters of dark bloody fluid was removed. A fluid sample was sent for laboratory analysis.  IMPRESSION: Successful ultrasound guided right thoracentesis yielding 1.4 liters of pleural fluid.  Read By:  Tsosie Billing PA-C   Electronically Signed   By: Aletta Edouard M.D.   On: 07/12/2013 11:11    Scheduled Meds: . enoxaparin (LOVENOX) injection  50 mg Subcutaneous Q24H  . feeding supplement (ENSURE COMPLETE)  237 mL Oral TID BM  .  gabapentin  100 mg Oral TID  . ipratropium-albuterol  3 mL Nebulization Q6H  . morphine  30 mg Oral Q12H  . sodium chloride  3 mL Intravenous Q12H   Continuous Infusions:       Time spent: 35 minutes    [facility]  [facility] Pager [phone] If 7PM-7AM, please contact night-coverage at [email or web address], password [facility] 07/13/2013, 11:26 AM  LOS: 2 days

## 2013-07-13 NOTE — Procedures (Signed)
US guided therapeutic left thoracentesis performed yielding 850 cc's dark, bloody fluid. No immediate complications. F/u CXR pending. Right pleural effusion is mod to large by limited US today.

## 2013-07-14 ENCOUNTER — Inpatient Hospital Stay (HOSPITAL_COMMUNITY): Payer: BC Managed Care – PPO

## 2013-07-14 DIAGNOSIS — R630 Anorexia: Secondary | ICD-10-CM

## 2013-07-14 LAB — TYPE AND SCREEN
ABO/RH(D): O POS
Antibody Screen: NEGATIVE
Unit division: 0
Unit division: 0

## 2013-07-14 LAB — BASIC METABOLIC PANEL
Anion gap: 13 (ref 5–15)
BUN: 15 mg/dL (ref 6–23)
CALCIUM: 8.8 mg/dL (ref 8.4–10.5)
CO2: 26 mEq/L (ref 19–32)
CREATININE: 0.8 mg/dL (ref 0.50–1.35)
Chloride: 92 mEq/L — ABNORMAL LOW (ref 96–112)
Glucose, Bld: 106 mg/dL — ABNORMAL HIGH (ref 70–99)
Potassium: 5.4 mEq/L — ABNORMAL HIGH (ref 3.7–5.3)
Sodium: 131 mEq/L — ABNORMAL LOW (ref 137–147)

## 2013-07-14 LAB — CBC
HCT: 29.5 % — ABNORMAL LOW (ref 39.0–52.0)
Hemoglobin: 10.1 g/dL — ABNORMAL LOW (ref 13.0–17.0)
MCH: 29.9 pg (ref 26.0–34.0)
MCHC: 34.2 g/dL (ref 30.0–36.0)
MCV: 87.3 fL (ref 78.0–100.0)
PLATELETS: 195 10*3/uL (ref 150–400)
RBC: 3.38 MIL/uL — ABNORMAL LOW (ref 4.22–5.81)
RDW: 18.6 % — AB (ref 11.5–15.5)
WBC: 3.7 10*3/uL — AB (ref 4.0–10.5)

## 2013-07-14 LAB — MAGNESIUM: Magnesium: 1.7 mg/dL (ref 1.5–2.5)

## 2013-07-14 MED ORDER — HEPARIN SOD (PORK) LOCK FLUSH 100 UNIT/ML IV SOLN
500.0000 [IU] | INTRAVENOUS | Status: AC | PRN
  Administered 2013-07-14: 500 [IU]

## 2013-07-14 MED ORDER — OXYCODONE HCL 5 MG PO CAPS: 5.0000 mg | ORAL_CAPSULE | Freq: Four times a day (QID) | ORAL | Status: DC | PRN

## 2013-07-14 MED ORDER — METOPROLOL TARTRATE 25 MG PO TABS: 25.0000 mg | ORAL_TABLET | Freq: Two times a day (BID) | ORAL | Status: AC

## 2013-07-14 NOTE — Discharge Summary (Signed)
Physician Discharge Summary  Fred [name]:[id number] DOB: Nov 10, 1970 DOA: 07/11/2013  PCP: [name], MD  Admit date: 07/11/2013 Discharge date: 07/14/2013  Time spent: 40 minutes  Recommendations for Outpatient Follow-up:  1. Followup with Dr. Julien Nordmann with a week. 2. Needs evaluation for CTS referral for management of malignant pleural effusion.  Discharge Diagnoses:  Principal Problem:   Malignant pleural effusion Active Problems:   Esophageal cancer   Metastatic cancer to spine   Accelerated hypertension   Hyponatremia   Protein-calorie malnutrition, severe   Discharge Condition: Stable  Diet recommendation: Heart healthy  Filed Weights   07/11/13 2026 07/11/13 2047  Weight: 101.243 kg (223 lb 3.2 oz) 102.4 kg (225 lb 12 oz)    History of present illness:  43 year old male with history of esophageal adenocarcinoma with osseous, pulmonary, hepatic and peritoneal metastases undergoing chemotherapy and radiation therapy was sent from [place] for symptoms of progressive shortness of breath on exertion for past 2-3 days. Patient had scheduled CT scan of the chest and abdomen to evaluate response to chemotherapy and short significant bilateral large pleural effusions with compressive atelectasis. Patient reports that for last 2-3 he has been dyspneic on minimal exertion and even trying to get out of bed.  Patient denies headache, dizziness, fever, chills, nausea , vomiting, chest pain, orthopnea or PND, hemoptysis, palpitations, abdominal pain, bowel or urinary symptoms. He reports poor appetite for past several days and due to his cancer he has only been taking clear liquids. He denies any recent illness.  Patient seen in the oncology clinic and given his progressive dyspnea on exertion and CT findings, hospitalist was consulted for admission with need for bilateral thoracentesis.  Of note patient was admitted in February of this year for metastatic spread to the thoracic  spine requiring T9 corpectomy with screw fixation by neurosurgery.  Hospital Course:   Malignant pleural effusion  -This is likely malignant pleural effusion, the fluid color is red, cytology results pending at the time of discharge.  -1 dose of Lasix given while he was in the hospital. -2 thoracentesis procedures done to the right side with total of 3.4 L removed, seems like it's already reaccumulating. -Please see attached pictures. -Patient will need outpatient referral to cardiothoracic surgery for management of his malignant effusion.  Metastatic esophageal adenocarcinoma with  CT scan with findings of progressive esophageal malignancy with large bilateral pleural effusion R>L with compressive atelectasis.  O2 sat maintained on room air. Continue oxygen via nasal cannula when necessary and scheduled duoneb as well as when necessary albuterol neb. Patient is tachycardic with elevated blood pressure on presentation. He needs therapeutic bilateral thoracenteses which can be done in am. Monitor for respiratory distress.  -Check proBNP and I/O. No signs of infiltrate on imaging.  -Patient has esophageal adenocarcinoma with pulmonary, hepatic, osseous and peritoneal metastases. Patient is currently undergoing chemotherapy and radiation therapy. His oncologist Dr. Julien Nordmann will follow patient while in the hospital.  -Resume home pain medications which includes MS Contin 30 mg twice a day and oxycodone 50 mg every 6 hours as needed.   GERD  Continue PPI   Elevated blood pressure  No history of underlying hypertension. I will place him on when necessary hydralazine.   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  Asymptomatic with normal renal function.   Hyponatremia  Possibly dehydration vs SIADH. Will send urine lytes   Tachycardia -Sinus tachycardia likely secondary to the pleural  effusion. -Patient started on metoprolol 25 mg  twice a day can be adjusted as outpatient.   Procedures:  07/14/2013: Right-sided thoracentesis with removal of 2 L of bloody fluid.  07/13/2013: Left-sided thoracentesis with removal of 850 cc of bloody fluid.  07/12/2013: Right-sided thoracentesis with removal of 1.4 L of bloody fluid.  Consultations:  Oncology and interventional radiology  Discharge Exam: Filed Vitals:   07/14/13 1041  BP: 108/72  Pulse:   Temp:   Resp:    General: Alert and awake, oriented x3, not in any acute distress. HEENT: anicteric sclera, pupils reactive to light and accommodation, EOMI CVS: S1-S2 clear, no murmur rubs or gallops Chest: clear to auscultation bilaterally, no wheezing, rales or rhonchi Abdomen: soft nontender, nondistended, normal bowel sounds, no organomegaly Extremities: no cyanosis, clubbing or edema noted bilaterally Neuro: Cranial nerves II-XII intact, no focal neurological deficits  Discharge Instructions You were cared for by a hospitalist during your hospital stay. If you have any questions about your discharge medications or the care you received while you were in the hospital after you are discharged, you can call the unit and asked to speak with the hospitalist on call if the hospitalist that took care of you is not available. Once you are discharged, your primary care physician will handle any further medical issues. Please note that NO REFILLS for any discharge medications will be authorized once you are discharged, as it is imperative that you return to your primary care physician (or establish a relationship with a primary care physician if you do not have one) for your aftercare needs so that they can reassess your need for medications and monitor your lab values.      Discharge Instructions   Increase activity slowly    Complete by:  As directed             Medication List         gabapentin 100 MG capsule  Commonly known as:  NEURONTIN  Take 1 capsule (100 mg  total) by mouth 3 (three) times daily.     lidocaine-prilocaine cream  Commonly known as:  EMLA  Apply 1 application topically as needed. Apply to port 1 hr before chemo     metoprolol tartrate 25 MG tablet  Commonly known as:  LOPRESSOR  Take 1 tablet (25 mg total) by mouth 2 (two) times daily.     oxycodone 5 MG capsule  Commonly known as:  OXY-IR  Take 1 capsule (5 mg total) by mouth every 6 (six) hours as needed for pain (1-2 tablets every 6 hours as needed).       No Known Allergies Follow-up Information   Follow up with [facility] K., MD In 1 week.   Specialty:  Oncology   Contact information:   [address] [place] [address] [phone]        The results of significant diagnostics from this hospitalization (including imaging, microbiology, ancillary and laboratory) are listed below for reference.    Significant Diagnostic Studies: Dg Chest 1 View  07/14/2013   CLINICAL DATA:  Post right-sided thoracentesis, history esophageal cancer  EXAM: CHEST - 1 VIEW  COMPARISON:  07/13/2013; chest CT - 07/11/2013  FINDINGS: Grossly unchanged borderline enlarged cardiac silhouette and mediastinal contours given persistently reduced lung volumes. There is persistent obscuration of the right heart border secondary to heterogeneous airspace opacities. No change to minimal reduction in persistent moderate size right-sided effusion post thoracentesis. Trace left-sided effusion is unchanged No pneumothorax.  Stable position of support apparatus. Unchanged bones including sequela of mid thoracic paraspinal fusion. Enteric contrast is seen within the colon.  IMPRESSION: 1. No change to minimal reduction and persistent at least moderate sized likely malignant right-sided effusion post thoracentesis. No pneumothorax. 2. Grossly unchanged obscuration of the right heart border secondary to heterogeneous opacities - atelectasis versus infiltrate.   Electronically Signed   By: Sandi Mariscal  M.D.   On: 07/14/2013 11:22   Dg Chest 1 View  07/13/2013   CLINICAL DATA:  Status post left thoracentesis  EXAM: CHEST - 1 VIEW  COMPARISON:  07/12/2013  FINDINGS: Cardiac shadow is enlarged but stable. Postsurgical changes are again seen. A right chest wall port is again noted. Increasing right-sided pleural effusion is seen. No pneumothorax following left thoracentesis is noted. The previously seen small effusion has resolved in the interval.  IMPRESSION: Resolution of left-sided pleural effusion following thoracentesis. No pneumothorax is noted.  Increasing recurrent right-sided pleural effusion.   Electronically Signed   By: Inez Catalina M.D.   On: 07/13/2013 12:07   Ct Chest W Contrast  07/11/2013   CLINICAL DATA:  Esophageal cancer air with metastasis.  EXAM: CT CHEST, ABDOMEN, AND PELVIS WITH CONTRAST  TECHNIQUE: Multidetector CT imaging of the chest, abdomen and pelvis was performed following the standard protocol during bolus administration of intravenous contrast.  CONTRAST:  134mL OMNIPAQUE IOHEXOL 300 MG/ML  SOLN  COMPARISON:  Chest CT 05/20/2013 and PET-CT 02/07/2013  FINDINGS: CT CHEST FINDINGS  The chest wall is unremarkable and stable. No chest wall lesions or axillary adenopathy. The right Port-A-Cath is stable. There are enlarged left-sided supraclavicular lymph nodes which appears stable. Maximum transverse measurement on image number 4 is 29 mm and was previously 27 mm. The thyroid gland is normal. The lymph nodes noted in the subpectoral region are stable also.  Diffuse osseous metastatic disease is again demonstrated. I do not see any significant interval change when compared to most recent prior CT scan. Stable thoracic spine decompression and fusion.  The heart is normal in size. No pericardial effusion. Significant progression of mediastinal lymphadenopathy with aorticopulmonary window nodal mass measuring a maximum of 2.3 cm. The esophageal mass is difficult to measure but overall  appears markedly enlarged. Adjacent adenopathy is noted. There is significant soft tissue surrounding the region of the esophagus and aorta which is worrisome for markedly progressive tumor. There are also large bilateral pleural effusions with significant compressive atelectasis of both lungs. There is an enhancing pleural nodule in the right lower lobe on image number 35.  No obvious pulmonary nodules in the aerated aspects of both lungs.  CT ABDOMEN AND PELVIS FINDINGS  Hepatic metastatic disease is again demonstrated. The largest lesion in the right hepatic lobe on image number 59 measures 2.7 x 2.2 cm and previously measured 2.3 x 2.0 cm. The lesion at the hepatic dome on image number 52 measures 3.5 x 2.7 cm and previously measured 2.3 x 2.3 cm. Possible small new lesions involving the medial segment of the left hepatic lobe.  The gallbladder is normal. No common bile duct dilatation. The pancreas is normal. The spleen is normal. The adrenal glands and kidneys are unremarkable and stable. 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  New and progressive peritoneal implants are noted. The Large irregular mass near the spleen is slightly progressive. There is a new right paraspinal mass on image number 68 which measures 2.5 x 2.4 cm. A smaller left paraspinal  mass is noted on the left.  The aorta and branch vessels are patent. 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 Small scattered retroperitoneal lymph nodes.  The bladder, prostate gland and seminal vesicles are unremarkable. No pelvic mass or adenopathy. No inguinal mass or adenopathy.  Stable osseous metastatic disease involving the spine and pelvis.  IMPRESSION: 1. Overall stable left supraclavicular lymphadenopathy appear 2. Significant progression of mediastinal lymphadenopathy and tumor surrounding the esophagus and aorta. 3. Large bilateral pleural effusions with significant compressive atelectasis of both lungs. There is an enhancing  pleural nodule in the right lower lobe on image number 35. 4. Progressive hepatic metastatic disease. 5. Progressive peritoneal implants in the abdomen. 6. Stable diffuse osseous metastatic disease.   Electronically Signed   By: Kalman Jewels M.D.   On: 07/11/2013 13:33   Ct Abdomen Pelvis W Contrast  07/11/2013   CLINICAL DATA:  Esophageal cancer air with metastasis.  EXAM: CT CHEST, ABDOMEN, AND PELVIS WITH CONTRAST  TECHNIQUE: Multidetector CT imaging of the chest, abdomen and pelvis was performed following the standard protocol during bolus administration of intravenous contrast.  CONTRAST:  171mL OMNIPAQUE IOHEXOL 300 MG/ML  SOLN  COMPARISON:  Chest CT 05/20/2013 and PET-CT 02/07/2013  FINDINGS: CT CHEST FINDINGS  The chest wall is unremarkable and stable. No chest wall lesions or axillary adenopathy. The right Port-A-Cath is stable. There are enlarged left-sided supraclavicular lymph nodes which appears stable. Maximum transverse measurement on image number 4 is 29 mm and was previously 27 mm. The thyroid gland is normal. The lymph nodes noted in the subpectoral region are stable also.  Diffuse osseous metastatic disease is again demonstrated. I do not see any significant interval change when compared to most recent prior CT scan. Stable thoracic spine decompression and fusion.  The heart is normal in size. No pericardial effusion. Significant progression of mediastinal lymphadenopathy with aorticopulmonary window nodal mass measuring a maximum of 2.3 cm. The esophageal mass is difficult to measure but overall appears markedly enlarged. Adjacent adenopathy is noted. There is significant soft tissue surrounding the region of the esophagus and aorta which is worrisome for markedly progressive tumor. There are also large bilateral pleural effusions with significant compressive atelectasis of both lungs. There is an enhancing pleural nodule in the right lower lobe on image number 35.  No obvious pulmonary  nodules in the aerated aspects of both lungs.  CT ABDOMEN AND PELVIS FINDINGS  Hepatic metastatic disease is again demonstrated. The largest lesion in the right hepatic lobe on image number 59 measures 2.7 x 2.2 cm and previously measured 2.3 x 2.0 cm. The lesion at the hepatic dome on image number 52 measures 3.5 x 2.7 cm and previously measured 2.3 x 2.3 cm. Possible small new lesions involving the medial segment of the left hepatic lobe.  The gallbladder is normal. No common bile duct dilatation. The pancreas is normal. The spleen is normal. The adrenal glands and kidneys are unremarkable and stable. 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  New and progressive peritoneal implants are noted. The Large irregular mass near the spleen is slightly progressive. There is a new right paraspinal mass on image number 68 which measures 2.5 x 2.4 cm. A smaller left paraspinal mass is noted on the left.  The aorta and branch vessels are patent. 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 Small scattered retroperitoneal lymph nodes.  The bladder, prostate gland and seminal vesicles are unremarkable. No pelvic mass or adenopathy. No inguinal mass  or adenopathy.  Stable osseous metastatic disease involving the spine and pelvis.  IMPRESSION: 1. Overall stable left supraclavicular lymphadenopathy appear 2. Significant progression of mediastinal lymphadenopathy and tumor surrounding the esophagus and aorta. 3. Large bilateral pleural effusions with significant compressive atelectasis of both lungs. There is an enhancing pleural nodule in the right lower lobe on image number 35. 4. Progressive hepatic metastatic disease. 5. Progressive peritoneal implants in the abdomen. 6. Stable diffuse osseous metastatic disease.   Electronically Signed   By: Kalman Jewels M.D.   On: 07/11/2013 13:33   Dg Chest Port 1 View  07/12/2013   CLINICAL DATA:  Status post right-sided thoracentesis  EXAM: PORTABLE CHEST - 1 VIEW   COMPARISON:  CT scan of the chest of July 11, 2013 and of the ultrasound of the thorax of today's date  FINDINGS: There is bilateral hypo inflation. There is a moderate-sized right pleural effusion with smaller left pleural effusion. There is no postprocedure pneumothorax. The cardiac silhouette is enlarged where visualized. There is soft tissue fullness in the right paratracheal region the pulmonary vascularity is prominent bilaterally. The power port catheter tip lies in the region of the mid SVC but is partially obscured by a previous spinal fusion appliances.  IMPRESSION: There is no postprocedure pneumothorax following right-sided thoracentesis. A moderate size right pleural effusion persists.   Electronically Signed   By: David  Martinique   On: 07/12/2013 10:38   US Thoracentesis Asp Pleural Space W/img Guide  07/14/2013   CLINICAL DATA:  Metastatic esophageal carcinoma, recurrent right pleural effusion, dyspnea. Request is made for therapeutic right thoracentesis.  EXAM: ULTRASOUND GUIDED THERAPEUTIC RIGHT THORACENTESIS  COMPARISON:  PRIOR THORACENTESIS ON 07/13/2013  PROCEDURE: An ultrasound guided thoracentesis was thoroughly discussed with the patient and questions answered. The benefits, risks, alternatives and complications were also discussed. The patient understands and wishes to proceed with the procedure. Written consent was obtained.  Ultrasound was performed to localize and mark an adequate pocket of fluid in the right chest. The area was then prepped and draped in the normal sterile fashion. 1% Lidocaine was used for local anesthesia. Under ultrasound guidance a 19 gauge Yueh catheter was introduced. Thoracentesis was performed. The catheter was removed and a dressing applied.  Complications:  None.  FINDINGS: A total of approximately 2 liters of dark, bloody fluid was removed.  IMPRESSION: Successful ultrasound guided therapeutic right thoracentesis yielding 2 liters of pleural fluid.  Read by:  Rowe Robert ,P.A.-C.   Electronically Signed   By: Sandi Mariscal M.D.   On: 07/14/2013 10:54   US Thoracentesis Asp Pleural Space W/img Guide  07/13/2013   CLINICAL DATA:  Metastatic esophageal carcinoma, dyspnea, bilateral pleural effusions, status post right thoracentesis on 07/12/2013. Request is made for therapeutic left thoracentesis  EXAM: ULTRASOUND GUIDED THERAPEUTIC LEFT THORACENTESIS  COMPARISON:  PRIOR THORACENTESIS ON 07/12/2013  PROCEDURE: An ultrasound guided thoracentesis was thoroughly discussed with the patient and questions answered. The benefits, risks, alternatives and complications were also discussed. The patient understands and wishes to proceed with the procedure. Written consent was obtained.  Ultrasound was performed to localize and mark an adequate pocket of fluid in the left chest. The area was then prepped and draped in the normal sterile fashion. 1% Lidocaine was used for local anesthesia. Under ultrasound guidance a 19 gauge Yueh catheter was introduced. Thoracentesis was performed. The catheter was removed and a dressing applied.  Complications:  None.  FINDINGS: A total of approximately 850 cc's of dark, bloody  fluid was removed.  IMPRESSION: Successful ultrasound guided therapeutic left thoracentesis yielding 850 cc's of pleural fluid.  Read by: Rowe Robert ,P.A.-C.   Electronically Signed   By: Sandi Mariscal M.D.   On: 07/13/2013 12:15   US Thoracentesis Asp Pleural Space W/img Guide  07/12/2013   CLINICAL DATA:  Metastatic esophageal cancer, bilateral pleural effusions right greater than left, request for thoracentesis.  EXAM: ULTRASOUND GUIDED RIGHT DIAGNOSTIC AND THERAPEUTIC THORACENTESIS  COMPARISON:  None.  PROCEDURE: An ultrasound guided thoracentesis was thoroughly discussed with the patient and questions answered. The benefits, risks, alternatives and complications were also discussed. The patient understands and wishes to proceed with the procedure. Written consent was  obtained.  Ultrasound was performed to localize and mark an adequate pocket of fluid in the right chest. The area was then prepped and draped in the normal sterile fashion. 1% Lidocaine was used for local anesthesia. Under ultrasound guidance a 19 gauge Yueh catheter was introduced. Thoracentesis was performed. The catheter was removed and a dressing applied.  Complications:  None.  FINDINGS: A total of approximately 1.4 liters of dark bloody fluid was removed. A fluid sample was sent for laboratory analysis.  IMPRESSION: Successful ultrasound guided right thoracentesis yielding 1.4 liters of pleural fluid.  Read By:  Tsosie Billing PA-C   Electronically Signed   By: Aletta Edouard M.D.   On: 07/12/2013 11:11    Microbiology: No results found for this or any previous visit (from the past 240 hour(s)).   Labs: Basic Metabolic Panel:  Recent Labs Lab 07/11/13 1606 07/11/13 1835 07/13/13 0520 07/14/13 0500  NA 130*  --  131* 131*  K 4.4  --  4.9 5.4*  CL  --   --  91* 92*  CO2 23  --  26 26  GLUCOSE 99  --  103* 106*  BUN 7.5  --  12 15  CREATININE 0.7  --  0.79 0.80  CALCIUM 8.8  --  8.8 8.8  MG  --  1.2*  --  1.7   Liver Function Tests:  Recent Labs Lab 07/11/13 1606 07/11/13 1835  AST 27 25  ALT 20 16  ALKPHOS 93 91  BILITOT 0.75 0.5  PROT 6.3* 5.7*  ALBUMIN 3.0* 2.7*   No results found for this basename: LIPASE, AMYLASE,  in the last 168 hours No results found for this basename: AMMONIA,  in the last 168 hours CBC:  Recent Labs Lab 07/11/13 1604 07/11/13 1835 07/12/13 0509 07/13/13 0520 07/14/13 0500  WBC 3.6* 2.4* 2.9* 3.7* 3.7*  NEUTROABS 2.3 1.4*  --   --   --   HGB 9.0* 8.9* 8.0* 10.4* 10.1*  HCT 27.0* 25.5* 23.1* 30.3* 29.5*  MCV 89.3 85.3 87.5 86.1 87.3  PLT 238 170 176 213 195   Cardiac Enzymes:  Recent Labs Lab 07/11/13 1605 07/11/13 1606  CKTOTAL 37  --   CKMB 1.9  --   TROPONINI  --  <0.30   BNP: BNP (last 3 results)  Recent Labs   07/11/13 1830  PROBNP 111.5   CBG: No results found for this basename: GLUCAP,  in the last 168 hours     Signed:  Nashae Maudlin A  [facility] 07/14/2013, 12:38 PM

## 2013-07-14 NOTE — Progress Notes (Signed)
Subjective: Fred [name] is seen and examined today. He is feeling a little bit better after the second thoracentesis yesterday. He still has a lot of fluid on the right side and expected to have another right-sided ultrasound-guided thoracentesis today. He is able to drink and eat liquid diet. His wife was at the bedside and they have a lot of questions about the next step in his treatment. He denied having any significant fever or chills. He has no nausea or vomiting. He continues to be tachycardic.  Objective: Vital signs in last 24 hours: Temp:  [98.2 F (36.8 C)-99 F (37.2 C)] 98.5 F (36.9 C) (07/12 0437) Pulse Rate:  [128-138] 128 (07/12 0437) Resp:  [18-20] 20 (07/12 0437) BP: (107-137)/(65-92) 108/72 mmHg (07/12 1041) SpO2:  [93 %-99 %] 96 % (07/12 1041)  Intake/Output from previous day: 07/11 0701 - 07/12 0700 In: 170 [P.O.:120; IV Piggyback:50] Out: 850  Intake/Output this shift: Total I/O In: -  Out: 250 [Urine:250]  General appearance: alert, cooperative, fatigued and no distress Resp: diminished breath sounds RLL and dullness to percussion RLL Cardio: regular rate and rhythm, S1, S2 normal, no murmur, click, rub or gallop GI: soft, non-tender; bowel sounds normal; no masses,  no organomegaly Extremities: extremities normal, atraumatic, no cyanosis or edema  Lab Results:   Recent Labs  07/13/13 0520 07/14/13 0500  WBC 3.7* 3.7*  HGB 10.4* 10.1*  HCT 30.3* 29.5*  PLT 213 195   BMET  Recent Labs  07/13/13 0520 07/14/13 0500  NA 131* 131*  K 4.9 5.4*  CL 91* 92*  CO2 26 26  GLUCOSE 103* 106*  BUN 12 15  CREATININE 0.79 0.80  CALCIUM 8.8 8.8    Studies/Results: Dg Chest 1 View  07/13/2013   CLINICAL DATA:  Status post left thoracentesis  EXAM: CHEST - 1 VIEW  COMPARISON:  07/12/2013  FINDINGS: Cardiac shadow is enlarged but stable. Postsurgical changes are again seen. A right chest wall port is again noted. Increasing right-sided pleural effusion is seen.  No pneumothorax following left thoracentesis is noted. The previously seen small effusion has resolved in the interval.  IMPRESSION: Resolution of left-sided pleural effusion following thoracentesis. No pneumothorax is noted.  Increasing recurrent right-sided pleural effusion.   Electronically Signed   By: Inez Catalina M.D.   On: 07/13/2013 12:07   US Thoracentesis Asp Pleural Space W/img Guide  07/13/2013   CLINICAL DATA:  Metastatic esophageal carcinoma, dyspnea, bilateral pleural effusions, status post right thoracentesis on 07/12/2013. Request is made for therapeutic left thoracentesis  EXAM: ULTRASOUND GUIDED THERAPEUTIC LEFT THORACENTESIS  COMPARISON:  PRIOR THORACENTESIS ON 07/12/2013  PROCEDURE: An ultrasound guided thoracentesis was thoroughly discussed with the patient and questions answered. The benefits, risks, alternatives and complications were also discussed. The patient understands and wishes to proceed with the procedure. Written consent was obtained.  Ultrasound was performed to localize and mark an adequate pocket of fluid in the left chest. The area was then prepped and draped in the normal sterile fashion. 1% Lidocaine was used for local anesthesia. Under ultrasound guidance a 19 gauge Yueh catheter was introduced. Thoracentesis was performed. The catheter was removed and a dressing applied.  Complications:  None.  FINDINGS: A total of approximately 850 cc's of dark, bloody fluid was removed.  IMPRESSION: Successful ultrasound guided therapeutic left thoracentesis yielding 850 cc's of pleural fluid.  Read by: Rowe Robert ,P.A.-C.   Electronically Signed   By: Sandi Mariscal M.D.   On: 07/13/2013 12:15  Medications: I have reviewed the patient's current medications.  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 He also had 2 cycles of systemic chemotherapy with cisplatin and irinotecan in the recent scan showed evidence for disease progression.  I discussed with the patient and his wife his treatment options including treatment with docetaxel 75 mg/M2 plus/minus Cyramza 10 mg/kg with Neulasta support every 3 weeks. I may consider starting his treatment this coming week. 2) shortness of breath: Secondary to bilateral pleural effusion. This improved after bilateral thoracenteses but the patient is to have a lot of fluid on the right side. We will proceed with another ultrasound guided right thoracentesis today. If he continues to have reaccumulation of the pleural fluid, I may consider referring the patient to thoracic surgery for consideration of Pleurx catheter placement on outpatient basis.  3) chemotherapy-induced anemia. Improved after 2 units of PRBCs transfusion.  4) dysphagia: Continue palliative radiotherapy to the distal esophageal mass.  5) disposition: Expected discharged later today if stable.  Thank you so much for taking good care of Mr. Anglin, I will continue to follow up the patient with you and assist in his management on as-needed basis.    LOS: 3 days    Fred [name] K. 07/14/2013

## 2013-07-14 NOTE — Procedures (Signed)
US guided therapeutic right thoracentesis performed yielding 2 liters dark bloody fluid. No immediate complications. Fu CXR pending.

## 2013-07-15 ENCOUNTER — Ambulatory Visit
Admission: RE | Admit: 2013-07-15 | Discharge: 2013-07-15 | Disposition: A | Discharge status: Home / Self Care | Payer: BC Managed Care – PPO | Source: Ambulatory Visit | Attending: Radiation Oncology | Admitting: Radiation Oncology

## 2013-07-15 ENCOUNTER — Other Ambulatory Visit: Payer: Self-pay | Admitting: Internal Medicine

## 2013-07-15 DIAGNOSIS — Z79899 Other long term (current) drug therapy: Secondary | ICD-10-CM | POA: Diagnosis not present

## 2013-07-15 DIAGNOSIS — R1310 Dysphagia, unspecified: Secondary | ICD-10-CM | POA: Diagnosis not present

## 2013-07-15 DIAGNOSIS — Z7901 Long term (current) use of anticoagulants: Secondary | ICD-10-CM | POA: Diagnosis not present

## 2013-07-15 DIAGNOSIS — R000 Tachycardia, unspecified: Secondary | ICD-10-CM | POA: Diagnosis not present

## 2013-07-15 DIAGNOSIS — Z510 Encounter for antineoplastic radiation therapy: Secondary | ICD-10-CM | POA: Diagnosis not present

## 2013-07-15 DIAGNOSIS — R609 Edema, unspecified: Secondary | ICD-10-CM | POA: Diagnosis not present

## 2013-07-15 DIAGNOSIS — C159 Malignant neoplasm of esophagus, unspecified: Secondary | ICD-10-CM | POA: Diagnosis not present

## 2013-07-15 DIAGNOSIS — R05 Cough: Secondary | ICD-10-CM | POA: Diagnosis not present

## 2013-07-15 DIAGNOSIS — R0602 Shortness of breath: Secondary | ICD-10-CM | POA: Diagnosis not present

## 2013-07-15 DIAGNOSIS — R059 Cough, unspecified: Secondary | ICD-10-CM | POA: Diagnosis not present

## 2013-07-16 ENCOUNTER — Encounter: Payer: BC Managed Care – PPO | Admitting: Physical Therapy

## 2013-07-16 ENCOUNTER — Ambulatory Visit
Admission: RE | Admit: 2013-07-16 | Discharge: 2013-07-16 | Disposition: A | Discharge status: Home / Self Care | Payer: BC Managed Care – PPO | Source: Ambulatory Visit | Attending: Radiation Oncology | Admitting: Radiation Oncology

## 2013-07-16 ENCOUNTER — Telehealth: Payer: Self-pay | Admitting: Internal Medicine

## 2013-07-16 DIAGNOSIS — Z510 Encounter for antineoplastic radiation therapy: Secondary | ICD-10-CM | POA: Diagnosis not present

## 2013-07-16 NOTE — Telephone Encounter (Signed)
added lb/AJ for 7/16 per 7/13 pof. not able to schedule f/u for 240XB as this conflict w/xrt schedule - s/w pt he is aware. pt tx is currently scheduled for 7/17 and there is no availability to move it to 7/16 - message to MM. all future tx appts are on Friday and past tx appts were also on friday.

## 2013-07-17 ENCOUNTER — Ambulatory Visit
Admission: RE | Admit: 2013-07-17 | Discharge: 2013-07-17 | Disposition: A | Discharge status: Home / Self Care | Payer: BC Managed Care – PPO | Source: Ambulatory Visit | Attending: Radiation Oncology | Admitting: Radiation Oncology

## 2013-07-17 DIAGNOSIS — Z510 Encounter for antineoplastic radiation therapy: Secondary | ICD-10-CM | POA: Diagnosis not present

## 2013-07-18 ENCOUNTER — Telehealth: Payer: Self-pay | Admitting: Physician Assistant

## 2013-07-18 ENCOUNTER — Ambulatory Visit
Admission: RE | Admit: 2013-07-18 | Discharge: 2013-07-18 | Disposition: A | Discharge status: Home / Self Care | Payer: BC Managed Care – PPO | Source: Ambulatory Visit | Attending: Radiation Oncology | Admitting: Radiation Oncology

## 2013-07-18 ENCOUNTER — Ambulatory Visit (HOSPITAL_BASED_OUTPATIENT_CLINIC_OR_DEPARTMENT_OTHER): Payer: BC Managed Care – PPO | Admitting: Physician Assistant

## 2013-07-18 ENCOUNTER — Telehealth: Payer: Self-pay | Admitting: *Deleted

## 2013-07-18 ENCOUNTER — Encounter: Payer: Self-pay | Admitting: Radiation Oncology

## 2013-07-18 ENCOUNTER — Encounter: Payer: BC Managed Care – PPO | Admitting: Physical Therapy

## 2013-07-18 ENCOUNTER — Encounter: Payer: Self-pay | Admitting: Physician Assistant

## 2013-07-18 ENCOUNTER — Other Ambulatory Visit (HOSPITAL_BASED_OUTPATIENT_CLINIC_OR_DEPARTMENT_OTHER): Payer: BC Managed Care – PPO

## 2013-07-18 VITALS — BP 136/92 | HR 101 | Temp 97.7°F | Resp 19 | Ht 72.0 in | Wt 221.7 lb

## 2013-07-18 DIAGNOSIS — C159 Malignant neoplasm of esophagus, unspecified: Secondary | ICD-10-CM

## 2013-07-18 DIAGNOSIS — C7952 Secondary malignant neoplasm of bone marrow: Secondary | ICD-10-CM

## 2013-07-18 DIAGNOSIS — C787 Secondary malignant neoplasm of liver and intrahepatic bile duct: Secondary | ICD-10-CM

## 2013-07-18 DIAGNOSIS — R5383 Other fatigue: Secondary | ICD-10-CM

## 2013-07-18 DIAGNOSIS — C155 Malignant neoplasm of lower third of esophagus: Secondary | ICD-10-CM

## 2013-07-18 DIAGNOSIS — R1310 Dysphagia, unspecified: Secondary | ICD-10-CM

## 2013-07-18 DIAGNOSIS — Z510 Encounter for antineoplastic radiation therapy: Secondary | ICD-10-CM | POA: Diagnosis not present

## 2013-07-18 DIAGNOSIS — K769 Liver disease, unspecified: Secondary | ICD-10-CM

## 2013-07-18 DIAGNOSIS — C7951 Secondary malignant neoplasm of bone: Secondary | ICD-10-CM

## 2013-07-18 DIAGNOSIS — C7800 Secondary malignant neoplasm of unspecified lung: Secondary | ICD-10-CM

## 2013-07-18 DIAGNOSIS — R5381 Other malaise: Secondary | ICD-10-CM

## 2013-07-18 DIAGNOSIS — R634 Abnormal weight loss: Secondary | ICD-10-CM

## 2013-07-18 LAB — CBC WITH DIFFERENTIAL/PLATELET
BASO%: 0.7 % (ref 0.0–2.0)
Basophils Absolute: 0 10*3/uL (ref 0.0–0.1)
EOS%: 2.8 % (ref 0.0–7.0)
Eosinophils Absolute: 0.1 10*3/uL (ref 0.0–0.5)
HCT: 27.8 % — ABNORMAL LOW (ref 38.4–49.9)
HGB: 9.4 g/dL — ABNORMAL LOW (ref 13.0–17.1)
LYMPH#: 0.4 10*3/uL — AB (ref 0.9–3.3)
LYMPH%: 11.3 % — ABNORMAL LOW (ref 14.0–49.0)
MCH: 30.1 pg (ref 27.2–33.4)
MCHC: 33.7 g/dL (ref 32.0–36.0)
MCV: 89.3 fL (ref 79.3–98.0)
MONO#: 1.3 10*3/uL — ABNORMAL HIGH (ref 0.1–0.9)
MONO%: 36.4 % — AB (ref 0.0–14.0)
NEUT#: 1.7 10*3/uL (ref 1.5–6.5)
NEUT%: 48.8 % (ref 39.0–75.0)
Platelets: 368 10*3/uL (ref 140–400)
RBC: 3.11 10*6/uL — AB (ref 4.20–5.82)
RDW: 19.2 % — AB (ref 11.0–14.6)
WBC: 3.6 10*3/uL — ABNORMAL LOW (ref 4.0–10.3)

## 2013-07-18 LAB — COMPREHENSIVE METABOLIC PANEL (CC13)
ALBUMIN: 2.4 g/dL — AB (ref 3.5–5.0)
ALK PHOS: 93 U/L (ref 40–150)
ALT: 17 U/L (ref 0–55)
AST: 38 U/L — AB (ref 5–34)
Anion Gap: 11 mEq/L (ref 3–11)
BUN: 6.1 mg/dL — ABNORMAL LOW (ref 7.0–26.0)
CHLORIDE: 95 meq/L — AB (ref 98–109)
CO2: 25 mEq/L (ref 22–29)
Calcium: 8.6 mg/dL (ref 8.4–10.4)
Creatinine: 0.7 mg/dL (ref 0.7–1.3)
Glucose: 90 mg/dl (ref 70–140)
POTASSIUM: 4.4 meq/L (ref 3.5–5.1)
Sodium: 131 mEq/L — ABNORMAL LOW (ref 136–145)
Total Bilirubin: 0.51 mg/dL (ref 0.20–1.20)
Total Protein: 5.9 g/dL — ABNORMAL LOW (ref 6.4–8.3)

## 2013-07-18 NOTE — Telephone Encounter (Signed)
Per pharmacy scheduled appt.

## 2013-07-18 NOTE — Progress Notes (Signed)
  Radiation Oncology         [phone] ________________________________  Name: Fred [name]  MRN: [id number]  Date: 07/18/2013  DOB: 1970-05-20  Weekly Radiation Therapy Management  Current Dose: 25 Gy     Planned Dose:  35 Gy  Narrative . . . . . . . . The patient presents for routine under treatment assessment.                                   The patient is still noting dysphagia to certain foods, but otherwise without complaint.                                 Set-up films were reviewed.                                 The chart was checked. Physical Findings. . . . Weight essentially stable.  No significant changes. Impression . . . . . . . The patient is tolerating radiation. Plan . . . . . . . . . . . . Continue treatment as planned.  ________________________________  Sheral Apley. [name], M.D.

## 2013-07-18 NOTE — Telephone Encounter (Signed)
Pt confirmed labs/ov per 07/16 POF, gave pt AVS.....kJ

## 2013-07-18 NOTE — Progress Notes (Addendum)
[place] [phone]   [phone]  SHARED VISIT PROGRESS NOTE  [name], MD [place] [id number]  DIAGNOSIS: Metastatic esophageal adenocarcinoma with Negative HER-2 diagnosed in January of 2015.  MOLECULAR BIOMARKERS: Foundation one: Amplification of PIK3CA, RAF1, CCND1, MYC(equivocal), PRKCl, SOX2, EPHB1, FGF19, FGF3, FGF4, GATA6, TERC and [id number], [id number].  PRIOR THERAPY:  1) status post T8-T10 spinal fusion under the care of Dr. Vertell Limber.  2) status post a stereotactic radiotherapy to the T9 lesion under the care of Dr. [name]. 3) Systemic chemotherapy with EOX, (epirubicin, oxaliplatin and Xeloda) every 3 weeks. First dose on 03/18/2013, status post 3 cycles discontinued today secondary to disease progression.   4) systemic chemotherapy with cisplatin 30 mg/M2 and irinotecan 65 mg/M2 days 1 and 8 every 3 weeks. Status post 2 cycles   CURRENT THERAPY: systemic chemotherapy with docetaxel 75 mg/m2 and ramucirumab (Cyramza) 10 mg/kg. First cycle given on 07/19/2013. Status post one cycle.   Advanced directives: The patient does not have advanced directives and he was given information package.  INTERVAL HISTORY: Fred [name] 43 y.o. male returns to the clinic today for followup visit accompanied by his daughter, Burman Nieves. Overall he is feeling better since his discharge from the hospital. He feels that he is breathing better. He continues to do his incentive spirometry. He is following primarily a liquid diet due to discomfort/difficulty with swallowing. He is status post bilateral thoracentesis while hospitalized, last performed 07/14/2013. He presents today prior to proceeding with his first cycle of systemic chemotherapy with docetaxel and ramucirumab. He denied having any significant fever or chills, no nausea or vomiting. The patient lost a few more pounds since his last visit and he continues to have  dysphagia especially to solid food.    MEDICAL HISTORY: Past Medical History  Diagnosis Date  . GERD (gastroesophageal reflux disease)     cannot taste anything  . 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  MEDICATIONS:  Current Outpatient Prescriptions  Medication Sig Dispense Refill  . gabapentin (NEURONTIN) 100 MG capsule Take 1 capsule (100 mg total) by mouth 3 (three) times daily.  90 capsule  1  . lidocaine-prilocaine (EMLA) cream Apply 1 application topically as needed. Apply to port 1 hr before chemo  30 g  0  . metoprolol tartrate (LOPRESSOR) 25 MG tablet Take 1 tablet (25 mg total) by mouth 2 (two) times daily.  60 tablet  0  . oxycodone (OXY-IR) 5 MG capsule Take 1 capsule (5 mg total) by mouth every 6 (six) hours as needed for pain (1-2 tablets every 6 hours as needed).  30 capsule  0   No current facility-administered medications for this visit.   Facility-Administered Medications Ordered in Other Visits  Medication Dose Route Frequency Provider Last Rate Last Dose  . sodium chloride 0.9 % injection 10 mL  10 mL Intravenous PRN Curt Bears, MD   10 mL at 04/29/13 1105    REVIEW OF SYSTEMS:  Constitutional: positive for fatigue and weight loss Eyes: negative Ears, nose, mouth, throat, and face: negative Respiratory: negative Cardiovascular: negative Gastrointestinal: positive for dysphagia Genitourinary:negative Integument/breast: negative Hematologic/lymphatic: negative Musculoskeletal:negative Neurological: negative Behavioral/Psych: negative Endocrine: negative Allergic/Immunologic: negative   PHYSICAL EXAMINATION: General appearance: alert, cooperative, fatigued and no distress Head: Normocephalic, without obvious abnormality, atraumatic Neck: no JVD,  supple, symmetrical, trachea midline, thyroid not enlarged, symmetric, no tenderness/mass/nodules  and Palpable small left supraclavicular lymph node Lymph nodes: Palpable small left supraclavicular lymph node Resp: clear to auscultation bilaterally Back: symmetric, no curvature. ROM normal. No CVA tenderness. Cardio: regular rate and rhythm, S1, S2 normal, no murmur, click, rub or gallop GI: soft, non-tender; bowel sounds normal; no masses,  no organomegaly Extremities: extremities normal, atraumatic, no cyanosis or edema Neurologic: Alert and oriented X 3, normal strength and tone. Normal symmetric reflexes. Normal coordination and gait  ECOG PERFORMANCE STATUS: 1 - Symptomatic but completely ambulatory  Blood pressure 136/92, pulse 101, temperature 97.7 F (36.5 C), temperature source Oral, resp. rate 19, height 6' (1.829 m), weight 221 lb 11.2 oz (100.562 kg).  LABORATORY DATA: Lab Results  Component Value Date   WBC 3.6* 07/18/2013   HGB 9.4* 07/18/2013   HCT 27.8* 07/18/2013   MCV 89.3 07/18/2013   PLT 368 07/18/2013      Chemistry      Component Value Date/Time   NA 131* 07/18/2013 0835   NA 131* 07/14/2013 0500   K 4.4 07/18/2013 0835   K 5.4* 07/14/2013 0500   CL 92* 07/14/2013 0500   CO2 25 07/18/2013 0835   CO2 26 07/14/2013 0500   BUN 6.1* 07/18/2013 0835   BUN 15 07/14/2013 0500   CREATININE 0.7 07/18/2013 0835   CREATININE 0.80 07/14/2013 0500      Component Value Date/Time   CALCIUM 8.6 07/18/2013 0835   CALCIUM 8.8 07/14/2013 0500   ALKPHOS 93 07/18/2013 0835   ALKPHOS 91 07/11/2013 1835   AST 38* 07/18/2013 0835   AST 25 07/11/2013 1835   ALT 17 07/18/2013 0835   ALT 16 07/11/2013 1835   BILITOT 0.51 07/18/2013 0835   BILITOT 0.5 07/11/2013 1835       RADIOGRAPHIC STUDIES:  ASSESSMENT AND PLAN: This is a very pleasant and unfortunate 43 years old white male who is recently diagnosed with widely metastatic distal esophageal adenocarcinoma. The patient is status post stabilization of his thoracic vertebrae with resection of tumor from that area followed by stereotactic  radiotherapy. The patient completed a course of systemic chemotherapy with EOX status post 3 cycles and tolerating it fairly well but unfortunately restaging CT scan of the chest, abdomen and pelvis showed evidence for disease progression with new lesions in the liver. He is status post 2 cycles of systemic chemotherapy with cisplatin and irinotecan.he tolerated this relatively well the exception of a few episodes of diarrhea. Unfortunately recent restaging CT scans revealed evidence for disease progression. Patient was discussed with and also seen by Dr. Julien Nordmann. He will proceed with his first cycle of systemic chemotherapy with docetaxel at 75 mg per meter squared and ramucirumab at 10 mg per kilogram with Neulasta support given every 3 weeks as scheduled. When he returns in one week for weekly labs, we will have him obtain a followup chest x-ray to see if he is reaccumulating fluid in either lung that would necessitate a repeat thoracentesis. No followup in one week for a symptom management visit.   For pain management the patient will continue on MS Contin 30 mg every 12 hours in addition to oxycodone for breakthrough pain. He was advised to call immediately if he has any concerning symptoms in the interval. The patient voices understanding of current disease status and treatment options and is in agreement with the current care plan. All questions were answered. The patient knows to call the clinic  with any problems, questions or concerns. We can certainly see the patient much sooner if necessary.  Carlton Adam PA-C  ADDENDUM: Hematology/Oncology Attending:  I had a face to face encounter with the patient. I recommended his care plan. This is a very pleasant 43 years old white male diagnosed with metastatic distal esophageal/ gastroesophageal adenocarcinoma. He is status post 2 regimen of systemic chemotherapy but unfortunately continues to have evidence for disease progression. He was  recently admitted to [facility] with significant dyspnea with bilateral pleural effusion right more than left. He underwent thoracentesis twice to the right side and once to the left side with improvement in his condition. He is here today for evaluation before starting systemic chemotherapy with docetaxel and Cyramza with Neulasta support. He is feeling better today and would like to proceed with the treatment tomorrow as scheduled. I reminded the patient was adverse effect of this chemotherapy and he would like to proceed with treatment as planned. He would come back for followup visit in 3 weeks with the start of cycle #2. For pain management he will continue on his current pain medication with MS Contin and oxycodone. He was advised to call immediately if he has any concerning symptoms in the interval.  Disclaimer: This note was dictated with voice recognition software. Similar sounding words can inadvertently be transcribed and may not be corrected upon review. Eilleen Kempf., MD 07/22/2013

## 2013-07-18 NOTE — Telephone Encounter (Signed)
Per pof scheduled for labs and appts.

## 2013-07-19 ENCOUNTER — Other Ambulatory Visit: Payer: BC Managed Care – PPO

## 2013-07-19 ENCOUNTER — Ambulatory Visit (HOSPITAL_BASED_OUTPATIENT_CLINIC_OR_DEPARTMENT_OTHER): Payer: BC Managed Care – PPO

## 2013-07-19 ENCOUNTER — Ambulatory Visit
Admission: RE | Admit: 2013-07-19 | Discharge: 2013-07-19 | Disposition: A | Discharge status: Home / Self Care | Payer: BC Managed Care – PPO | Source: Ambulatory Visit | Attending: Radiation Oncology | Admitting: Radiation Oncology

## 2013-07-19 ENCOUNTER — Encounter: Payer: BC Managed Care – PPO | Admitting: Radiation Oncology

## 2013-07-19 VITALS — BP 106/74 | HR 94 | Temp 97.9°F | Resp 18

## 2013-07-19 DIAGNOSIS — Z510 Encounter for antineoplastic radiation therapy: Secondary | ICD-10-CM | POA: Diagnosis not present

## 2013-07-19 DIAGNOSIS — Z5111 Encounter for antineoplastic chemotherapy: Secondary | ICD-10-CM

## 2013-07-19 DIAGNOSIS — C7952 Secondary malignant neoplasm of bone marrow: Secondary | ICD-10-CM

## 2013-07-19 DIAGNOSIS — C7951 Secondary malignant neoplasm of bone: Secondary | ICD-10-CM

## 2013-07-19 DIAGNOSIS — Z5112 Encounter for antineoplastic immunotherapy: Secondary | ICD-10-CM

## 2013-07-19 DIAGNOSIS — C155 Malignant neoplasm of lower third of esophagus: Secondary | ICD-10-CM

## 2013-07-19 DIAGNOSIS — C159 Malignant neoplasm of esophagus, unspecified: Secondary | ICD-10-CM

## 2013-07-19 MED ORDER — DEXAMETHASONE SODIUM PHOSPHATE 10 MG/ML IJ SOLN
INTRAMUSCULAR | Status: AC
  Filled 2013-07-19: qty 1

## 2013-07-19 MED ORDER — ONDANSETRON 8 MG/50ML IVPB (CHCC)
8.0000 mg | Freq: Once | INTRAVENOUS | Status: AC
  Administered 2013-07-19: 8 mg via INTRAVENOUS

## 2013-07-19 MED ORDER — HEPARIN SOD (PORK) LOCK FLUSH 100 UNIT/ML IV SOLN
500.0000 [IU] | Freq: Once | INTRAVENOUS | Status: AC | PRN
  Administered 2013-07-19: 500 [IU]
  Filled 2013-07-19: qty 5

## 2013-07-19 MED ORDER — DEXAMETHASONE SODIUM PHOSPHATE 10 MG/ML IJ SOLN
10.0000 mg | Freq: Once | INTRAMUSCULAR | Status: AC
  Administered 2013-07-19: 10 mg via INTRAVENOUS

## 2013-07-19 MED ORDER — SODIUM CHLORIDE 0.9 % IV SOLN
10.0000 mg/kg | Freq: Once | INTRAVENOUS | Status: AC
  Administered 2013-07-19: 1020 mg via INTRAVENOUS
  Filled 2013-07-19: qty 102

## 2013-07-19 MED ORDER — ACETAMINOPHEN 325 MG PO TABS
ORAL_TABLET | ORAL | Status: AC
  Filled 2013-07-19: qty 2

## 2013-07-19 MED ORDER — SODIUM CHLORIDE 0.9 % IJ SOLN
10.0000 mL | INTRAMUSCULAR | Status: DC | PRN
  Administered 2013-07-19: 10 mL
  Filled 2013-07-19: qty 10

## 2013-07-19 MED ORDER — DEXTROSE 5 % IV SOLN
75.0000 mg/m2 | Freq: Once | INTRAVENOUS | Status: AC
  Administered 2013-07-19: 170 mg via INTRAVENOUS
  Filled 2013-07-19: qty 17

## 2013-07-19 MED ORDER — SODIUM CHLORIDE 0.9 % IV SOLN
Freq: Once | INTRAVENOUS | Status: AC
  Administered 2013-07-19: 10:00:00 via INTRAVENOUS

## 2013-07-19 MED ORDER — ONDANSETRON 8 MG/NS 50 ML IVPB
INTRAVENOUS | Status: AC
  Filled 2013-07-19: qty 8

## 2013-07-19 MED ORDER — DIPHENHYDRAMINE HCL 50 MG/ML IJ SOLN
INTRAMUSCULAR | Status: AC
  Filled 2013-07-19: qty 1

## 2013-07-19 MED ORDER — ACETAMINOPHEN 325 MG PO TABS
650.0000 mg | ORAL_TABLET | Freq: Once | ORAL | Status: AC
  Administered 2013-07-19: 650 mg via ORAL

## 2013-07-19 MED ORDER — DIPHENHYDRAMINE HCL 50 MG/ML IJ SOLN
50.0000 mg | Freq: Once | INTRAMUSCULAR | Status: AC
  Administered 2013-07-19: 50 mg via INTRAVENOUS

## 2013-07-19 NOTE — Patient Instructions (Signed)
[facility] Discharge Instructions for Patients Receiving Chemotherapy  Today you received the following chemotherapy agents Cyramza/Docetaxel.   To help prevent nausea and vomiting after your treatment, we encourage you to take your nausea medication as directed.    If you develop nausea and vomiting that is not controlled by your nausea medication, call the clinic.   BELOW ARE SYMPTOMS THAT SHOULD BE REPORTED IMMEDIATELY:  *FEVER GREATER THAN 100.5 F  *CHILLS WITH OR WITHOUT FEVER  NAUSEA AND VOMITING THAT IS NOT CONTROLLED WITH YOUR NAUSEA MEDICATION  *UNUSUAL SHORTNESS OF BREATH  *UNUSUAL BRUISING OR BLEEDING  TENDERNESS IN MOUTH AND THROAT WITH OR WITHOUT PRESENCE OF ULCERS  *URINARY PROBLEMS  *BOWEL PROBLEMS  UNUSUAL RASH Items with * indicate a potential emergency and should be followed up as soon as possible.  Feel free to call the clinic you have any questions or concerns. The clinic phone number is [phone].  Ramucirumab injection What is this medicine? RAMUCIRUMAB (ra mue SIR ue mab) is a chemotherapy drug. It is used to treat stomach cancer or lung cancer. This drug targets a specific protein receptor on cancer cells and stops the cancer cells from growing. This medicine may be used for other purposes; ask your health care provider or pharmacist if you have questions. COMMON BRAND NAME(S): Cyramza What should I tell my health care provider before I take this medicine? They need to know if you have any of these conditions: -bleeding disorders -blood clots -heart disease, including heart failure, heart attack, or chest pain (angina) -high blood pressure -infection (especially a virus infection such as chickenpox, cold sores, or herpes) -protein in your urine -recent surgery -stroke -an unusual or allergic reaction to ramucirumab, other medicines, foods, dyes, or preservatives -pregnant or trying to get pregnant -breast-feeding How should  I use this medicine? This medicine is for infusion into a vein. It is given by a health care professional in a hospital or clinic setting. Talk to your pediatrician regarding the use of this medicine in children. Special care may be needed. Overdosage: If you think you've taken too much of this medicine contact a poison control center or emergency room at once. Overdosage: If you think you have taken too much of this medicine contact a poison control center or emergency room at once. NOTE: This medicine is only for you. Do not share this medicine with others. What if I miss a dose? It is important not to miss your dose. Call your doctor or health care professional if you are unable to keep an appointment. What may interact with this medicine? Interactions have not been studied. This list may not describe all possible interactions. Give your health care provider a list of all the medicines, herbs, non-prescription drugs, or dietary supplements you use. Also tell them if you smoke, drink alcohol, or use illegal drugs. Some items may interact with your medicine. What should I watch for while using this medicine? Your condition will be monitored carefully while you are receiving this medicine. You will need to to check your blood pressure and have your blood and urine tested while you are taking this medicine. Your condition will be monitored carefully while you are receiving this medicine. This medicine may increase your risk to bruise or bleed. Call your doctor or health care professional if you notice any unusual bleeding. This medicine may rarely cause 'gastrointestinal perforation' (holes in the stomach, intestines or colon), a serious side effect requiring surgery to repair. This medicine  should be started at least 28 days following major surgery and the site of the surgery should be totally healed. Check with your doctor before scheduling dental work or surgery while you are receiving this  treatment. Talk to your doctor if you have recently had surgery or if you have a wound that has not healed. Do not become pregnant while taking this medicine or for 3 months after stopping it. Women should inform their doctor if they wish to become pregnant or think they might be pregnant. There is a potential for serious side effects to an unborn child. Talk to your health care professional or pharmacist for more information. What side effects may I notice from receiving this medicine? Side effects that you should report to your doctor or health care professional as soon as possible: -allergic reactions like skin rash, itching or hives, breathing problems, swelling of the face, lips, or tongue -signs of infection - fever or chills, cough, sore throat -chest pain or chest tightness -confusion -dizziness -feeling faint or lightheaded, falls -severe abdominal pain -severe nausea, vomiting -signs and symptoms of bleeding such as bloody or black, tarry stools; red or dark-brown urine; spitting up blood or brown material that looks like coffee grounds; red spots on the skin; unusual bruising or bleeding from the eye, gums, or nose -signs and symptoms of a blood clot such as breathing problems; changes in vision; chest pain; severe, sudden headache; pain, swelling, warmth in the leg; trouble speaking; sudden numbness or weakness of the face, arm or leg -symptoms of a stroke: change in mental awareness, inability to talk or move one side of the body -trouble walking, dizziness, loss of balance or coordination Side effects that usually do not require medical attention (Report these to your doctor or health care professional if they continue or are bothersome.): -constipation -diarrhea -headache -nausea, vomiting -stomach pain This list may not describe all possible side effects. Call your doctor for medical advice about side effects. You may report side effects to FDA at 1-800-FDA-1088. Where should I  keep my medicine? This drug is given in a hospital or clinic and will not be stored at home. NOTE: This sheet is a summary. It may not cover all possible information. If you have questions about this medicine, talk to your doctor, pharmacist, or health care provider.  2015, Elsevier/Gold Standard. (2012-12-18 16:41:25)  Docetaxel injection What is this medicine? DOCETAXEL (doe se TAX el) is a chemotherapy drug. It targets fast dividing cells, like cancer cells, and causes these cells to die. This medicine is used to treat many types of cancers like breast cancer, certain stomach cancers, head and neck cancer, lung cancer, and prostate cancer. This medicine may be used for other purposes; ask your health care provider or pharmacist if you have questions. COMMON BRAND NAME(S): Docefrez, Taxotere What should I tell my health care provider before I take this medicine? They need to know if you have any of these conditions: -infection (especially a virus infection such as chickenpox, cold sores, or herpes) -liver disease -low blood counts, like low white cell, platelet, or red cell counts -an unusual or allergic reaction to docetaxel, polysorbate 80, other chemotherapy agents, other medicines, foods, dyes, or preservatives -pregnant or trying to get pregnant -breast-feeding How should I use this medicine? This drug is given as an infusion into a vein. It is administered in a hospital or clinic by a specially trained health care professional. Talk to your pediatrician regarding the use of this medicine  in children. Special care may be needed. Overdosage: If you think you have taken too much of this medicine contact a poison control center or emergency room at once. NOTE: This medicine is only for you. Do not share this medicine with others. What if I miss a dose? It is important not to miss your dose. Call your doctor or health care professional if you are unable to keep an appointment. What may  interact with this medicine? -cyclosporine -erythromycin -ketoconazole -medicines to increase blood counts like filgrastim, pegfilgrastim, sargramostim -vaccines Talk to your doctor or health care professional before taking any of these medicines: -acetaminophen -aspirin -ibuprofen -ketoprofen -naproxen This list may not describe all possible interactions. Give your health care provider a list of all the medicines, herbs, non-prescription drugs, or dietary supplements you use. Also tell them if you smoke, drink alcohol, or use illegal drugs. Some items may interact with your medicine. What should I watch for while using this medicine? Your condition will be monitored carefully while you are receiving this medicine. You will need important blood work done while you are taking this medicine. This drug may make you feel generally unwell. This is not uncommon, as chemotherapy can affect healthy cells as well as cancer cells. Report any side effects. Continue your course of treatment even though you feel ill unless your doctor tells you to stop. In some cases, you may be given additional medicines to help with side effects. Follow all directions for their use. Call your doctor or health care professional for advice if you get a fever, chills or sore throat, or other symptoms of a cold or flu. Do not treat yourself. This drug decreases your body's ability to fight infections. Try to avoid being around people who are sick. This medicine may increase your risk to bruise or bleed. Call your doctor or health care professional if you notice any unusual bleeding. Be careful brushing and flossing your teeth or using a toothpick because you may get an infection or bleed more easily. If you have any dental work done, tell your dentist you are receiving this medicine. Avoid taking products that contain aspirin, acetaminophen, ibuprofen, naproxen, or ketoprofen unless instructed by your doctor. These medicines  may hide a fever. This medicine contains an alcohol in the product. You may get drowsy or dizzy. Do not drive, use machinery, or do anything that needs mental alertness until you know how this medicine affects you. Do not stand or sit up quickly, especially if you are an older patient. This reduces the risk of dizzy or fainting spells. Avoid alcoholic drinks Do not become pregnant while taking this medicine. Women should inform their doctor if they wish to become pregnant or think they might be pregnant. There is a potential for serious side effects to an unborn child. Talk to your health care professional or pharmacist for more information. Do not breast-feed an infant while taking this medicine. What side effects may I notice from receiving this medicine? Side effects that you should report to your doctor or health care professional as soon as possible: -allergic reactions like skin rash, itching or hives, swelling of the face, lips, or tongue -low blood counts - This drug may decrease the number of white blood cells, red blood cells and platelets. You may be at increased risk for infections and bleeding. -signs of infection - fever or chills, cough, sore throat, pain or difficulty passing urine -signs of decreased platelets or bleeding - bruising, pinpoint red spots on  the skin, black, tarry stools, nosebleeds -signs of decreased red blood cells - unusually weak or tired, fainting spells, lightheadedness -breathing problems -fast or irregular heartbeat -low blood pressure -mouth sores -nausea and vomiting -pain, swelling, redness or irritation at the injection site -pain, tingling, numbness in the hands or feet -swelling of the ankle, feet, hands -weight gain Side effects that usually do not require medical attention (report to your prescriber or health care professional if they continue or are bothersome): -bone pain -complete hair loss including hair on your head, underarms, pubic hair,  eyebrows, and eyelashes -diarrhea -excessive tearing -changes in the color of fingernails -loosening of the fingernails -nausea -muscle pain -red flush to skin -sweating -weak or tired This list may not describe all possible side effects. Call your doctor for medical advice about side effects. You may report side effects to FDA at 1-800-FDA-1088. Where should I keep my medicine? This drug is given in a hospital or clinic and will not be stored at home. NOTE: This sheet is a summary. It may not cover all possible information. If you have questions about this medicine, talk to your doctor, pharmacist, or health care provider.  2015, Elsevier/Gold Standard. (2012-11-15 22:21:02)  Pegfilgrastim injection What is this medicine? PEGFILGRASTIM (peg fil GRA stim) helps the body make more white blood cells. It is used to prevent infection in people with low amounts of white blood cells following cancer treatment. This medicine may be used for other purposes; ask your health care provider or pharmacist if you have questions. COMMON BRAND NAME(S): Neulasta What should I tell my health care provider before I take this medicine? They need to know if you have any of these conditions: -sickle cell disease -an unusual or allergic reaction to pegfilgrastim, filgrastim, E.coli protein, other medicines, foods, dyes, or preservatives -pregnant or trying to get pregnant -breast-feeding How should I use this medicine? This medicine is for injection under the skin. It is usually given by a health care professional in a hospital or clinic setting. If you get this medicine at home, you will be taught how to prepare and give this medicine. Do not shake this medicine. Use exactly as directed. Take your medicine at regular intervals. Do not take your medicine more often than directed. It is important that you put your used needles and syringes in a special sharps container. Do not put them in a trash can. If you  do not have a sharps container, call your pharmacist or healthcare provider to get one. Talk to your pediatrician regarding the use of this medicine in children. While this drug may be prescribed for children who weigh more than 45 kg for selected conditions, precautions do apply Overdosage: If you think you have taken too much of this medicine contact a poison control center or emergency room at once. NOTE: This medicine is only for you. Do not share this medicine with others. What if I miss a dose? If you miss a dose, take it as soon as you can. If it is almost time for your next dose, take only that dose. Do not take double or extra doses. What may interact with this medicine? -lithium -medicines for growth therapy This list may not describe all possible interactions. Give your health care provider a list of all the medicines, herbs, non-prescription drugs, or dietary supplements you use. Also tell them if you smoke, drink alcohol, or use illegal drugs. Some items may interact with your medicine. What should I watch for while  using this medicine? Visit your doctor for regular check ups. You will need important blood work done while you are taking this medicine. What side effects may I notice from receiving this medicine? Side effects that you should report to your doctor or health care professional as soon as possible: -allergic reactions like skin rash, itching or hives, swelling of the face, lips, or tongue -breathing problems -fever -pain, redness, or swelling where injected -shoulder pain -stomach or side pain Side effects that usually do not require medical attention (report to your doctor or health care professional if they continue or are bothersome): -aches, pains -headache -loss of appetite -nausea, vomiting -unusually tired This list may not describe all possible side effects. Call your doctor for medical advice about side effects. You may report side effects to FDA at  1-800-FDA-1088. Where should I keep my medicine? Keep out of the reach of children. Store in a refrigerator between 2 and 8 degrees C (36 and 46 degrees F). Do not freeze. Keep in carton to protect from light. Throw away this medicine if it is left out of the refrigerator for more than 48 hours. Throw away any unused medicine after the expiration date. NOTE: This sheet is a summary. It may not cover all possible information. If you have questions about this medicine, talk to your doctor, pharmacist, or health care provider.  2015, Elsevier/Gold Standard. (2007-07-23 15:41:44)

## 2013-07-20 ENCOUNTER — Ambulatory Visit (HOSPITAL_BASED_OUTPATIENT_CLINIC_OR_DEPARTMENT_OTHER): Payer: BC Managed Care – PPO

## 2013-07-20 VITALS — BP 123/87 | HR 107 | Temp 97.8°F | Resp 18

## 2013-07-20 DIAGNOSIS — C159 Malignant neoplasm of esophagus, unspecified: Secondary | ICD-10-CM

## 2013-07-20 DIAGNOSIS — Z5189 Encounter for other specified aftercare: Secondary | ICD-10-CM

## 2013-07-20 DIAGNOSIS — C7951 Secondary malignant neoplasm of bone: Secondary | ICD-10-CM

## 2013-07-20 DIAGNOSIS — C7952 Secondary malignant neoplasm of bone marrow: Secondary | ICD-10-CM

## 2013-07-20 DIAGNOSIS — C155 Malignant neoplasm of lower third of esophagus: Secondary | ICD-10-CM

## 2013-07-20 MED ORDER — PEGFILGRASTIM INJECTION 6 MG/0.6ML
6.0000 mg | Freq: Once | SUBCUTANEOUS | Status: AC
  Administered 2013-07-20: 6 mg via SUBCUTANEOUS

## 2013-07-20 NOTE — Patient Instructions (Signed)

## 2013-07-22 ENCOUNTER — Encounter (HOSPITAL_COMMUNITY): Payer: Self-pay | Admitting: Internal Medicine

## 2013-07-22 ENCOUNTER — Inpatient Hospital Stay (HOSPITAL_COMMUNITY): Payer: BC Managed Care – PPO

## 2013-07-22 ENCOUNTER — Ambulatory Visit
Admission: RE | Admit: 2013-07-22 | Discharge: 2013-07-22 | Disposition: A | Discharge status: Home / Self Care | Payer: BC Managed Care – PPO | Source: Ambulatory Visit | Attending: Radiation Oncology | Admitting: Radiation Oncology

## 2013-07-22 ENCOUNTER — Other Ambulatory Visit: Payer: Self-pay

## 2013-07-22 ENCOUNTER — Other Ambulatory Visit: Payer: Self-pay | Admitting: *Deleted

## 2013-07-22 ENCOUNTER — Encounter (HOSPITAL_COMMUNITY): Payer: Self-pay

## 2013-07-22 ENCOUNTER — Inpatient Hospital Stay (HOSPITAL_COMMUNITY)
Admission: AD | Admit: 2013-07-22 | Discharge: 2013-07-25 | DRG: 054 | Disposition: A | Discharge status: Home / Self Care | Payer: BC Managed Care – PPO | Source: Ambulatory Visit | Attending: Internal Medicine | Admitting: Internal Medicine

## 2013-07-22 ENCOUNTER — Telehealth: Payer: Self-pay | Admitting: Medical Oncology

## 2013-07-22 ENCOUNTER — Encounter: Payer: Self-pay | Admitting: Radiation Oncology

## 2013-07-22 ENCOUNTER — Telehealth: Payer: Self-pay | Admitting: *Deleted

## 2013-07-22 VITALS — BP 130/84 | HR 138 | Resp 20 | Wt 221.0 lb

## 2013-07-22 DIAGNOSIS — E871 Hypo-osmolality and hyponatremia: Secondary | ICD-10-CM

## 2013-07-22 DIAGNOSIS — C7931 Secondary malignant neoplasm of brain: Secondary | ICD-10-CM

## 2013-07-22 DIAGNOSIS — C7951 Secondary malignant neoplasm of bone: Secondary | ICD-10-CM

## 2013-07-22 DIAGNOSIS — C159 Malignant neoplasm of esophagus, unspecified: Secondary | ICD-10-CM

## 2013-07-22 DIAGNOSIS — T451X5A Adverse effect of antineoplastic and immunosuppressive drugs, initial encounter: Secondary | ICD-10-CM | POA: Diagnosis present

## 2013-07-22 DIAGNOSIS — R630 Anorexia: Secondary | ICD-10-CM

## 2013-07-22 DIAGNOSIS — J910 Malignant pleural effusion: Secondary | ICD-10-CM

## 2013-07-22 DIAGNOSIS — I498 Other specified cardiac arrhythmias: Secondary | ICD-10-CM | POA: Diagnosis present

## 2013-07-22 DIAGNOSIS — C7949 Secondary malignant neoplasm of other parts of nervous system: Principal | ICD-10-CM

## 2013-07-22 DIAGNOSIS — R000 Tachycardia, unspecified: Secondary | ICD-10-CM | POA: Diagnosis not present

## 2013-07-22 DIAGNOSIS — R0902 Hypoxemia: Secondary | ICD-10-CM | POA: Diagnosis present

## 2013-07-22 DIAGNOSIS — R1310 Dysphagia, unspecified: Secondary | ICD-10-CM

## 2013-07-22 DIAGNOSIS — R5381 Other malaise: Secondary | ICD-10-CM | POA: Diagnosis present

## 2013-07-22 DIAGNOSIS — Z87891 Personal history of nicotine dependence: Secondary | ICD-10-CM | POA: Diagnosis not present

## 2013-07-22 DIAGNOSIS — E860 Dehydration: Secondary | ICD-10-CM

## 2013-07-22 DIAGNOSIS — Z981 Arthrodesis status: Secondary | ICD-10-CM

## 2013-07-22 DIAGNOSIS — R059 Cough, unspecified: Secondary | ICD-10-CM | POA: Diagnosis not present

## 2013-07-22 DIAGNOSIS — E8809 Other disorders of plasma-protein metabolism, not elsewhere classified: Secondary | ICD-10-CM | POA: Diagnosis present

## 2013-07-22 DIAGNOSIS — K5900 Constipation, unspecified: Secondary | ICD-10-CM | POA: Diagnosis present

## 2013-07-22 DIAGNOSIS — R05 Cough: Secondary | ICD-10-CM | POA: Diagnosis not present

## 2013-07-22 DIAGNOSIS — I10 Essential (primary) hypertension: Secondary | ICD-10-CM

## 2013-07-22 DIAGNOSIS — R532 Functional quadriplegia: Secondary | ICD-10-CM | POA: Diagnosis present

## 2013-07-22 DIAGNOSIS — E46 Unspecified protein-calorie malnutrition: Secondary | ICD-10-CM | POA: Diagnosis present

## 2013-07-22 DIAGNOSIS — K219 Gastro-esophageal reflux disease without esophagitis: Secondary | ICD-10-CM | POA: Diagnosis present

## 2013-07-22 DIAGNOSIS — J90 Pleural effusion, not elsewhere classified: Secondary | ICD-10-CM

## 2013-07-22 DIAGNOSIS — D6481 Anemia due to antineoplastic chemotherapy: Secondary | ICD-10-CM | POA: Diagnosis present

## 2013-07-22 DIAGNOSIS — E43 Unspecified severe protein-calorie malnutrition: Secondary | ICD-10-CM

## 2013-07-22 DIAGNOSIS — R0789 Other chest pain: Secondary | ICD-10-CM

## 2013-07-22 DIAGNOSIS — R609 Edema, unspecified: Secondary | ICD-10-CM | POA: Diagnosis not present

## 2013-07-22 DIAGNOSIS — C7800 Secondary malignant neoplasm of unspecified lung: Secondary | ICD-10-CM

## 2013-07-22 DIAGNOSIS — R0602 Shortness of breath: Secondary | ICD-10-CM | POA: Diagnosis present

## 2013-07-22 DIAGNOSIS — C7952 Secondary malignant neoplasm of bone marrow: Secondary | ICD-10-CM

## 2013-07-22 DIAGNOSIS — Z510 Encounter for antineoplastic radiation therapy: Secondary | ICD-10-CM | POA: Diagnosis present

## 2013-07-22 DIAGNOSIS — Z79899 Other long term (current) drug therapy: Secondary | ICD-10-CM | POA: Diagnosis not present

## 2013-07-22 DIAGNOSIS — D649 Anemia, unspecified: Secondary | ICD-10-CM

## 2013-07-22 DIAGNOSIS — C787 Secondary malignant neoplasm of liver and intrahepatic bile duct: Secondary | ICD-10-CM

## 2013-07-22 DIAGNOSIS — Z7901 Long term (current) use of anticoagulants: Secondary | ICD-10-CM | POA: Diagnosis not present

## 2013-07-22 LAB — COMPREHENSIVE METABOLIC PANEL
ALT: 26 U/L (ref 0–53)
ANION GAP: 11 (ref 5–15)
AST: 74 U/L — ABNORMAL HIGH (ref 0–37)
Albumin: 2.2 g/dL — ABNORMAL LOW (ref 3.5–5.2)
Alkaline Phosphatase: 150 U/L — ABNORMAL HIGH (ref 39–117)
BILIRUBIN TOTAL: 0.7 mg/dL (ref 0.3–1.2)
BUN: 8 mg/dL (ref 6–23)
CHLORIDE: 96 meq/L (ref 96–112)
CO2: 26 meq/L (ref 19–32)
Calcium: 8.1 mg/dL — ABNORMAL LOW (ref 8.4–10.5)
Creatinine, Ser: 0.63 mg/dL (ref 0.50–1.35)
Glucose, Bld: 93 mg/dL (ref 70–99)
Potassium: 4.1 mEq/L (ref 3.7–5.3)
SODIUM: 133 meq/L — AB (ref 137–147)
Total Protein: 5.7 g/dL — ABNORMAL LOW (ref 6.0–8.3)

## 2013-07-22 LAB — CBC WITH DIFFERENTIAL/PLATELET
BASOS ABS: 0.5 10*3/uL — AB (ref 0.0–0.1)
Basophils Relative: 5 % — ABNORMAL HIGH (ref 0–1)
EOS ABS: 0 10*3/uL (ref 0.0–0.7)
Eosinophils Relative: 0 % (ref 0–5)
HCT: 25.6 % — ABNORMAL LOW (ref 39.0–52.0)
HEMOGLOBIN: 8.7 g/dL — AB (ref 13.0–17.0)
LYMPHS PCT: 2 % — AB (ref 12–46)
Lymphs Abs: 0.2 10*3/uL — ABNORMAL LOW (ref 0.7–4.0)
MCH: 29.8 pg (ref 26.0–34.0)
MCHC: 34 g/dL (ref 30.0–36.0)
MCV: 87.7 fL (ref 78.0–100.0)
Monocytes Absolute: 0.4 10*3/uL (ref 0.1–1.0)
Monocytes Relative: 4 % (ref 3–12)
NEUTROS PCT: 89 % — AB (ref 43–77)
Neutro Abs: 7.9 10*3/uL — ABNORMAL HIGH (ref 1.7–7.7)
Platelets: 343 10*3/uL (ref 150–400)
RBC: 2.92 MIL/uL — ABNORMAL LOW (ref 4.22–5.81)
RDW: 18.4 % — AB (ref 11.5–15.5)
WBC: 9 10*3/uL (ref 4.0–10.5)

## 2013-07-22 LAB — IRON AND TIBC
Iron: 101 ug/dL (ref 42–135)
SATURATION RATIOS: 69 % — AB (ref 20–55)
TIBC: 146 ug/dL — ABNORMAL LOW (ref 215–435)
UIBC: 45 ug/dL — ABNORMAL LOW (ref 125–400)

## 2013-07-22 LAB — VITAMIN B12: VITAMIN B 12: 662 pg/mL (ref 211–911)

## 2013-07-22 LAB — LACTATE DEHYDROGENASE: LDH: 397 U/L — ABNORMAL HIGH (ref 94–250)

## 2013-07-22 LAB — TROPONIN I: Troponin I: <0.3 ng/mL (ref <0.30)

## 2013-07-22 LAB — MAGNESIUM: Magnesium: 1.4 mg/dL — ABNORMAL LOW (ref 1.5–2.5)

## 2013-07-22 LAB — PRO B NATRIURETIC PEPTIDE: Pro B Natriuretic peptide (BNP): 174.1 pg/mL — ABNORMAL HIGH (ref 0–125)

## 2013-07-22 LAB — MRSA PCR SCREENING: MRSA BY PCR: NEGATIVE

## 2013-07-22 LAB — FERRITIN: Ferritin: 2023 ng/mL — ABNORMAL HIGH (ref 22–322)

## 2013-07-22 LAB — FOLATE: FOLATE: 11.9 ng/mL

## 2013-07-22 MED ORDER — SODIUM CHLORIDE 0.9 % IJ SOLN
3.0000 mL | Freq: Two times a day (BID) | INTRAMUSCULAR | Status: DC
  Administered 2013-07-22 – 2013-07-24 (×6): 3 mL via INTRAVENOUS
  Administered 2013-07-25: 10 mL via INTRAVENOUS

## 2013-07-22 MED ORDER — MORPHINE SULFATE ER 30 MG PO TBCR
30.0000 mg | EXTENDED_RELEASE_TABLET | Freq: Two times a day (BID) | ORAL | Status: DC
  Administered 2013-07-22 – 2013-07-25 (×7): 30 mg via ORAL
  Filled 2013-07-22 (×7): qty 1

## 2013-07-22 MED ORDER — SODIUM CHLORIDE 0.9 % IV SOLN
INTRAVENOUS | Status: AC
  Administered 2013-07-22 – 2013-07-23 (×2): via INTRAVENOUS

## 2013-07-22 MED ORDER — IPRATROPIUM BROMIDE 0.02 % IN SOLN: 0.5000 mg | RESPIRATORY_TRACT | Status: DC | PRN

## 2013-07-22 MED ORDER — OXYCODONE HCL 5 MG PO TABS
5.0000 mg | ORAL_TABLET | Freq: Four times a day (QID) | ORAL | Status: DC | PRN
  Administered 2013-07-22 – 2013-07-23 (×2): 10 mg via ORAL
  Filled 2013-07-22 (×3): qty 2

## 2013-07-22 MED ORDER — POLYETHYLENE GLYCOL 3350 17 G PO PACK
17.0000 g | PACK | Freq: Every day | ORAL | Status: DC | PRN
  Filled 2013-07-22: qty 1

## 2013-07-22 MED ORDER — LEVALBUTEROL HCL 0.63 MG/3ML IN NEBU: 0.6300 mg | INHALATION_SOLUTION | RESPIRATORY_TRACT | Status: DC | PRN

## 2013-07-22 MED ORDER — ACETAMINOPHEN 325 MG PO TABS: 650.0000 mg | ORAL_TABLET | Freq: Four times a day (QID) | ORAL | Status: DC | PRN

## 2013-07-22 MED ORDER — ONDANSETRON HCL 4 MG/2ML IJ SOLN: 4.0000 mg | Freq: Four times a day (QID) | INTRAMUSCULAR | Status: DC | PRN

## 2013-07-22 MED ORDER — PANTOPRAZOLE SODIUM 40 MG PO TBEC
40.0000 mg | DELAYED_RELEASE_TABLET | Freq: Every day | ORAL | Status: DC
  Administered 2013-07-23 – 2013-07-24 (×2): 40 mg via ORAL
  Filled 2013-07-22 (×2): qty 1

## 2013-07-22 MED ORDER — ONDANSETRON HCL 4 MG PO TABS: 4.0000 mg | ORAL_TABLET | Freq: Four times a day (QID) | ORAL | Status: DC | PRN

## 2013-07-22 MED ORDER — DOCUSATE SODIUM 100 MG PO CAPS
100.0000 mg | ORAL_CAPSULE | Freq: Two times a day (BID) | ORAL | Status: DC
  Administered 2013-07-22 – 2013-07-25 (×6): 100 mg via ORAL
  Filled 2013-07-22 (×7): qty 1

## 2013-07-22 MED ORDER — LIDOCAINE-PRILOCAINE 2.5-2.5 % EX CREA
1.0000 "application " | TOPICAL_CREAM | CUTANEOUS | Status: DC | PRN
  Filled 2013-07-22: qty 5

## 2013-07-22 MED ORDER — GABAPENTIN 100 MG PO CAPS
100.0000 mg | ORAL_CAPSULE | Freq: Three times a day (TID) | ORAL | Status: DC
  Administered 2013-07-22 – 2013-07-25 (×10): 100 mg via ORAL
  Filled 2013-07-22 (×12): qty 1

## 2013-07-22 MED ORDER — MAGNESIUM SULFATE 4000MG/100ML IJ SOLN
4.0000 g | Freq: Once | INTRAMUSCULAR | Status: AC
  Administered 2013-07-22: 4 g via INTRAVENOUS
  Filled 2013-07-22: qty 100

## 2013-07-22 MED ORDER — METOPROLOL TARTRATE 25 MG PO TABS
25.0000 mg | ORAL_TABLET | Freq: Two times a day (BID) | ORAL | Status: DC
  Administered 2013-07-22 (×2): 25 mg via ORAL
  Filled 2013-07-22 (×2): qty 1

## 2013-07-22 MED ORDER — SORBITOL 70 % SOLN
30.0000 mL | Freq: Every day | Status: DC | PRN
  Filled 2013-07-22: qty 30

## 2013-07-22 MED ORDER — ACETAMINOPHEN 650 MG RE SUPP: 650.0000 mg | Freq: Four times a day (QID) | RECTAL | Status: DC | PRN

## 2013-07-22 MED ORDER — MAGNESIUM CITRATE PO SOLN: 1.0000 | Freq: Once | ORAL | Status: AC | PRN

## 2013-07-22 MED ORDER — ENOXAPARIN SODIUM 40 MG/0.4ML ~~LOC~~ SOLN
40.0000 mg | SUBCUTANEOUS | Status: DC
  Administered 2013-07-22 – 2013-07-24 (×3): 40 mg via SUBCUTANEOUS
  Filled 2013-07-22 (×4): qty 0.4

## 2013-07-22 NOTE — Progress Notes (Addendum)
Asked to see re: recurrent left pleural effusion and dyspnea. Pt w/ metastatic esophageal adenocarcinoma. S/p thoracentesis 7/12. At this time cytology from right sided pleural fluid was + for malignant cells c/w adeno. Re-admitted from radiation Onc on 7/20 w/ decreased breath sounds and progressive dyspnea. In setting of recurrent malignant effusion will likely need Pleur-x cath. Given his current symptom burden elective pleur-x would be a reasonable consideration instead of therapeutic thora. Have spoke w/ Dr Grandville Silos. Suggest CVTS evaluation first to consider this.. We will hold off on formal consultation pending thoracic eval. We are certainly happy to assist in the mean-time should his symptom burden worsen before a pleur-x can be offered or if thoracic surg wants our involvement for repeat thoracentesis prior to consideration. Spoke w/ Dr Inda Merlin:  Of note the pt's current chemo regimen includes Cyramza. This does carry with it a larger bleeding risk & ideally waiting ~ 7d would decrease bleeding risk; however per heme/onc this would not be a contraindication for drainage of pleural fluid if his symptom burden were to require it.   Marni Griffon ACNP-BC [facility] Pager # [phone] OR # [phone] if no answer

## 2013-07-22 NOTE — Consult Note (Signed)
Name: Fred [name] MRN: [id number] DOB: 1970/11/06    ADMISSION DATE:  07/22/2013 CONSULTATION DATE:  7/20  REFERRING MD :  Grandville Silos  PRIMARY SERVICE:  [facility]   CHIEF COMPLAINT: recurrent pleural effusion   BRIEF PATIENT DESCRIPTION:  43 year old male w/ known h/o metastatic esophageal adenocarcinoma recently d/c'd from [facility] on 7/12 for dyspnea in setting of pleural effusions. Underwent bilateral thoracentesis prior to d/c. Cytology was positive for malignant cells c/w adeno. He was re-admitted from rad-onc office on 7/20 w/ re-accumulation of right pleural effusion and associated dyspnea. Thoracic surgery was consulted and felt going ahead w/ pleur-x cath was indicated, however recommended this be set up via out-pt setting and has requested pulmonary to go ahead with therapeutic thoracentesis for acute dyspnea management.   SIGNIFICANT EVENTS / STUDIES:  7/20: readmitted w/ re-accumulation of right effusion.   ANTIBIOTICS:  HISTORY OF PRESENT ILLNESS:   See above   PAST MEDICAL HISTORY :  Past Medical History  Diagnosis Date  . GERD (gastroesophageal reflux disease)     cannot taste anything  . 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 No pertinent past surgical history. Prior to Admission medications   Medication Sig Start Date End Date Taking? Authorizing Provider  gabapentin (NEURONTIN) 100 MG capsule Take 1 capsule (100 mg total) by mouth 3 (three) times daily. 04/29/13  Yes Curt Bears, MD  lidocaine-prilocaine (EMLA) cream Apply 1 application topically as needed (for port access (skin numbing)). Apply to port 1 hr before chemo 03/13/13  Yes Curt Bears, MD  metoprolol tartrate (LOPRESSOR) 25 MG tablet Take 1 tablet (25 mg total) by mouth 2 (two) times daily. 07/14/13  Yes Verlee Monte, MD  morphine (MS CONTIN) 30 MG 12 hr tablet Take 30 mg by mouth every 12 (twelve) hours.   Yes  Historical Provider, MD  oxyCODONE (OXY IR/ROXICODONE) 5 MG immediate release tablet Take 5-10 mg by mouth every 6 (six) hours as needed for severe pain.   Yes Historical Provider, MD   No Known Allergies  FAMILY HISTORY:  History reviewed. No pertinent family history. SOCIAL HISTORY:  reports that he has never smoked. He has quit using smokeless tobacco. His smokeless tobacco use included Chew. He reports that he drinks alcohol. He reports that he does not use illicit drugs.  REVIEW OF SYSTEMS:   Constitutional: Negative for fever, chills, weight loss, malaise/fatigue and diaphoresis.  HENT: Negative for hearing loss, ear pain, nosebleeds, congestion, sore throat, neck pain, tinnitus and ear discharge.   Eyes: Negative for blurred vision, double vision, photophobia, pain, discharge and redness.  Respiratory: Negative for cough, hemoptysis, sputum production, shortness of breath +, + chest tightness w/ inspiration, wheezing and stridor.   Cardiovascular: Negative for chest pain, palpitations, orthopnea, claudication, leg swelling and PND.  Gastrointestinal: Negative for heartburn, nausea, vomiting, abdominal pain, diarrhea, constipation, blood in stool and melena.  Genitourinary: Negative for dysuria, urgency, frequency, hematuria and flank pain.  Musculoskeletal: Negative for myalgias, back pain, joint pain and falls.  Skin: Negative for itching and rash.  Neurological: Negative for dizziness, tingling, tremors, sensory change, speech change, focal weakness, seizures, loss of consciousness, weakness and headaches.  Endo/Heme/Allergies: Negative for environmental allergies and polydipsia. Does not bruise/bleed easily.  SUBJECTIVE:  A little more SOB   VITAL SIGNS: Pulse Rate:  [129-138] 129 (07/20 1200) Resp:  [18-20] 18 (07/20 1200) BP: (110-130)/(80-84) 110/80 mmHg (07/20  1200) SpO2:  [95 %-97 %] 95 % (07/20 1200) Weight:  [100.245 kg (221 lb)] 100.245 kg (221 lb) (07/20 1005) 2  liters  PHYSICAL EXAMINATION: General:  Awake, a little more SOB c/w 7/20 Neuro:  Awake, alert, no focal def  HEENT:  [place], no JVD  Cardiovascular:  rrr Lungs:  Decreased on right. No accessory muscle use  Abdomen:  Soft, + bowel sounds  Musculoskeletal:  Intact  Skin:  Intact    Recent Labs Lab 07/18/13 0835 07/22/13 1159  NA 131* 133*  K 4.4 4.1  CL  --  96  CO2 25 26  BUN 6.1* 8  CREATININE 0.7 0.63  GLUCOSE 90 93    Recent Labs Lab 07/18/13 0835 07/22/13 1159  HGB 9.4* 8.7*  HCT 27.8* 25.6*  WBC 3.6* 9.0  PLT 368 343   Dg Chest 2 View  07/22/2013   CLINICAL DATA:  Shortness of breath.  Metastatic disease.  EXAM: CHEST  2 VIEW  COMPARISON:  Chest x-ray 07/14/2013.  FINDINGS: Power port noted with tip in superior vena cava. Right lower lobe atelectasis and or infiltrate and right-sided pleural effusion again noted. No interim change. No pneumothorax. Heart size stable. No acute osseus abnormality. Prior thoracic spine fusion. Surgical clips right upper quadrant.  IMPRESSION: Stable right lower lobe atelectasis and/or consolidation with stable prominent right pleural effusion.   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  Spoke w/ thoracic surgery, they agree that the pt would benefit from pleur-x drainage system. Ideally setting this up as out-pt would be preferred.   Plan We will go ahead and proceed w/ therapeutic thoracentesis on 7/21. This will also give one more day off the Cyramza which should decrease bleeding risk Thoracics will set up logistics of pleur-x thru the out-pt setting.   Pulmonary and [place] Pager: [phone]  07/22/2013, 2:32 PM  Reviewed above, examined, and agree.  He has recurrent Rt pleural effusion in setting of esophageal cancer.  He has progressive dyspnea, hypoxia,  and chest discomfort related to effusion.  Will proceed with Rt thoracentesis for symptom relief.  He will the f/u with thoracic surgery for pleurx catheter.  Chesley Mires, MD [facility] 07/23/2013, 11:10 AM Pager:  [phone] After 3pm call: [phone]

## 2013-07-22 NOTE — Telephone Encounter (Signed)
Pts wife called and left message with me earlier to get CXR today .In meantime pt went to XRT and seen by rad oncologist and having CXR today.

## 2013-07-22 NOTE — Consult Note (Signed)
Reason for Consult:Recurrent right pleural effusion Referring Physician: Marni Griffon, NP  Fred [name] is an 43 y.o. male.  HPI: 43 yo man with stage IV esophageal cancer who presented with a cc/o shortness of breath.  Fred [name] is a 43 yo male with a history of tobacco abuse who has stage IV esophageal cancer. He is currently undergoing chemotherapy and radiation. He will complete radiation this week. He developed bilateral pleural effusions- right > left. Thoracentesis of the right was performed 3 days just over a week ago with a total of 3.5 liters removed. He also had the left effusion tapped once draining 850 ml. 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 He felt better initially after the thoracentesis but started becoming SOB again less than a week afterwards. He was admitted today with progressive shortness of breath.  Past Medical History  Diagnosis Date  . GERD (gastroesophageal reflux disease)     cannot taste anything  . 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 No pertinent past surgical history.  History reviewed. No pertinent family history.  Social History:  reports that he has never smoked. He has quit using smokeless tobacco. His smokeless tobacco use included Chew. He reports that he drinks alcohol. He reports that he does not use illicit drugs.  Allergies: No Known Allergies  Medications:  Prior to Admission:  Prescriptions prior to admission  Medication Sig Dispense Refill  . gabapentin (NEURONTIN) 100 MG capsule Take 1 capsule (100 mg total) by mouth 3 (three) times daily.  90 capsule  1  . lidocaine-prilocaine (EMLA) cream Apply 1 application topically as needed (for port access (skin numbing)). Apply to port 1 hr before chemo      . metoprolol tartrate (LOPRESSOR) 25 MG tablet Take 1 tablet (25 mg total) by mouth 2 (two) times daily.  60 tablet  0  .  morphine (MS CONTIN) 30 MG 12 hr tablet Take 30 mg by mouth every 12 (twelve) hours.      Fred [name] Kitchen oxyCODONE (OXY IR/ROXICODONE) 5 MG immediate release tablet Take 5-10 mg by mouth every 6 (six) hours as needed for severe pain.      . [DISCONTINUED] lidocaine-prilocaine (EMLA) cream Apply 1 application topically as needed. Apply to port 1 hr before chemo  30 g  0    Results for orders placed during the hospital encounter of 07/22/13 (from the past 48 hour(s))  COMPREHENSIVE METABOLIC PANEL     Status: Abnormal   Collection Time    07/22/13 11:59 AM      Result Value Ref Range   Sodium 133 (*) 137 - 147 mEq/L   Potassium 4.1  3.7 - 5.3 mEq/L   Chloride 96  96 - 112 mEq/L   CO2 26  19 - 32 mEq/L   Glucose, Bld 93  70 - 99 mg/dL   BUN 8  6 - 23 mg/dL   Creatinine, Ser 0.63  0.50 - 1.35 mg/dL   Calcium 8.1 (*) 8.4 - 10.5 mg/dL   Total Protein 5.7 (*) 6.0 - 8.3 g/dL   Albumin 2.2 (*) 3.5 - 5.2 g/dL   AST 74 (*) 0 - 37 U/L   ALT 26  0 - 53 U/L   Alkaline Phosphatase 150 (*) 39 - 117 U/L   Total Bilirubin 0.7  0.3 - 1.2 mg/dL   GFR calc non Af Amer >90  >90  mL/min   GFR calc Af Amer >90  >90 mL/min   Comment: (NOTE)     The eGFR has been calculated using the CKD EPI equation.     This calculation has not been validated in all clinical situations.     eGFR's persistently <90 mL/min signify possible Chronic Kidney     Disease.   Anion gap 11  5 - 15  CBC WITH DIFFERENTIAL     Status: Abnormal   Collection Time    07/22/13 11:59 AM      Result Value Ref Range   WBC 9.0  4.0 - 10.5 K/uL   RBC 2.92 (*) 4.22 - 5.81 MIL/uL   Hemoglobin 8.7 (*) 13.0 - 17.0 g/dL   HCT 25.6 (*) 39.0 - 52.0 %   MCV 87.7  78.0 - 100.0 fL   MCH 29.8  26.0 - 34.0 pg   MCHC 34.0  30.0 - 36.0 g/dL   RDW 18.4 (*) 11.5 - 15.5 %   Platelets 343  150 - 400 K/uL   Neutrophils Relative % 89 (*) 43 - 77 %   Lymphocytes Relative 2 (*) 12 - 46 %   Monocytes Relative 4  3 - 12 %   Eosinophils Relative 0  0 - 5 %   Basophils  Relative 5 (*) 0 - 1 %   Neutro Abs 7.9 (*) 1.7 - 7.7 K/uL   Lymphs Abs 0.2 (*) 0.7 - 4.0 K/uL   Monocytes Absolute 0.4  0.1 - 1.0 K/uL   Eosinophils Absolute 0.0  0.0 - 0.7 K/uL   Basophils Absolute 0.5 (*) 0.0 - 0.1 K/uL   WBC Morphology MILD LEFT SHIFT (1-5% METAS, OCC MYELO, OCC BANDS)     Smear Review LARGE PLATELETS PRESENT    MAGNESIUM     Status: Abnormal   Collection Time    07/22/13 11:59 AM      Result Value Ref Range   Magnesium 1.4 (*) 1.5 - 2.5 mg/dL  LACTATE DEHYDROGENASE     Status: Abnormal   Collection Time    07/22/13 11:59 AM      Result Value Ref Range   LDH 397 (*) 94 - 250 U/L  PRO B NATRIURETIC PEPTIDE     Status: Abnormal   Collection Time    07/22/13 11:59 AM      Result Value Ref Range   Pro B Natriuretic peptide (BNP) 174.1 (*) 0 - 125 pg/mL  TROPONIN I     Status: None   Collection Time    07/22/13 11:59 AM      Result Value Ref Range   Troponin I <0.30  <0.30 ng/mL   Comment:            Due to the release kinetics of cTnI,     a negative result within the first hours     of the onset of symptoms does not rule out     myocardial infarction with certainty.     If myocardial infarction is still suspected,     repeat the test at appropriate intervals.  MRSA PCR SCREENING     Status: None   Collection Time    07/22/13 12:05 PM      Result Value Ref Range   MRSA by PCR NEGATIVE  NEGATIVE   Comment:            The GeneXpert MRSA Assay (FDA     approved for NASAL specimens     only), is  one component of a     comprehensive MRSA colonization     surveillance program. It is not     intended to diagnose MRSA     infection nor to guide or     monitor treatment for     MRSA infections.    Dg Chest 2 View  07/22/2013   CLINICAL DATA:  Shortness of breath.  Metastatic disease.  EXAM: CHEST  2 VIEW  COMPARISON:  Chest x-ray 07/14/2013.  FINDINGS: Power port noted with tip in superior vena cava. Right lower lobe atelectasis and or infiltrate and  right-sided pleural effusion again noted. No interim change. No pneumothorax. Heart size stable. No acute osseus abnormality. Prior thoracic spine fusion. Surgical clips right upper quadrant.  IMPRESSION: Stable right lower lobe atelectasis and/or consolidation with stable prominent right pleural effusion.   Electronically Signed   By: Marcello Moores  Register   On: 07/22/2013 13:38    Review of Systems  Constitutional: Positive for malaise/fatigue. Negative for fever.  Respiratory: Positive for shortness of breath.   Cardiovascular: Positive for chest pain (right sided, pleuritic).  Gastrointestinal:       Dysphagia  Musculoskeletal: Positive for back pain.  All other systems reviewed and are negative.  Blood pressure 125/82, pulse 129, resp. rate 18, SpO2 95.00%. Physical Exam  Constitutional: He is oriented to person, place, and time. He appears well-developed. No distress.  HENT:  Mouth/Throat: No oropharyngeal exudate.  alopecia  Eyes: EOM are normal. Pupils are equal, round, and reactive to light.  Neck: Neck supple. No thyromegaly present.  Cardiovascular: Regular rhythm and normal heart sounds.   tachycardic  Respiratory: He has no wheezes.  Diminished BS right side  GI: Soft. There is no tenderness.  Musculoskeletal: He exhibits edema.  Lymphadenopathy:    He has no cervical adenopathy.  Neurological: He is alert and oriented to person, place, and time. No cranial nerve deficit.  Skin: Skin is warm and dry.    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 He has a large effusion that has recurred rapidly. He would benefit from a thoracentesis to relieve his symptoms in the short run, but needs a long term solution as well.  He would benefit from a pleur-x catheter for long term management of his malignant effusion. It would allow the pleural effusion to be drained on a regular basis until he achieves pleural  symphysis or, alternatively would provide a mechanism for talc pleurodesis should that be necessary.  I described the pleur-x catheter and system with the patient and his wife. They understand the indications, risks, benefits and alternatives. They understand the risks include, but are not limited to bleeding, infection, catheter malposition, as well as long term risks of infection or the catheter becoming nonfunctional. He accepts the risks and agrees to proceed.  I discussed the plan with Marni Griffon. He will do a thoracentesis this afternoon or tomorrow AM. This will provide immediate relief and allow him to complete his radiation therapy. Hopefully, he will be able to go home. I'll then plan to place a pleur-x catheter on Friday as an outpatient.   Fay Bagg C 07/22/2013, 4:19 PM

## 2013-07-22 NOTE — Telephone Encounter (Signed)
Message copied by Cherylynn Ridges on Mon Jul 22, 2013  3:46 PM ------      Message from: Kellie Simmering A      Created: Fri Jul 19, 2013  2:24 PM       1st time Cyrama/Docetaxel. Dr. Julien Nordmann.                   [phone] (H)      [phone] (W)       ------

## 2013-07-22 NOTE — Progress Notes (Signed)
Patient scheduled for a chest xray on Friday. Patient reports a dry cough. Patient explains when he attempts to clear his throat he spits up white frothy phlegm. Reports tightness in chest mostly on the right side. Reports shortness of breath at rest. Heart rate elevated. Denies difficulty swallowing. Patient is concerned his "lungs are filling up again." Unable to hear air exchange right lower lung base.

## 2013-07-22 NOTE — Progress Notes (Signed)
Weekly Management Note:  outpatient Current Dose:  30 Gy  Projected Dose: 35 Gy  Esophageal Cancer  Narrative:  The patient presents for routine under treatment assessment.  CBCT/MVCT images/Port film x-rays were reviewed.  The chart was checked. He wasscheduled for a chest xray on Friday. Patient reports a dry cough. Patient explains when he attempts to clear his throat he spits up white frothy phlegm. Reports tightness in chest mostly on the right side. Reports shortness of breath at rest. Heart rate elevated. Denies difficulty swallowing. Patient is concerned his "lungs are filling up again."  LE edema worse.  Lightheaded. Poor PO intake. Tachycardic.   Physical Findings:  weight is 221 lb (100.245 kg). His blood pressure is 130/84 and his pulse is 138. His respiration is 20 and oxygen saturation is 97%.  In Wheelchair. Stooped forward.  Cannot hear breath sounds in right base.  LE edema. Tachycardia. Pale skin.  CBC    Component Value Date/Time   WBC 3.6* 07/18/2013 0835   WBC 3.7* 07/14/2013 0500   RBC 3.11* 07/18/2013 0835   RBC 3.38* 07/14/2013 0500   HGB 9.4* 07/18/2013 0835   HGB 10.1* 07/14/2013 0500   HCT 27.8* 07/18/2013 0835   HCT 29.5* 07/14/2013 0500   PLT 368 07/18/2013 0835   PLT 195 07/14/2013 0500   MCV 89.3 07/18/2013 0835   MCV 87.3 07/14/2013 0500   MCH 30.1 07/18/2013 0835   MCH 29.9 07/14/2013 0500   MCHC 33.7 07/18/2013 0835   MCHC 34.2 07/14/2013 0500   RDW 19.2* 07/18/2013 0835   RDW 18.6* 07/14/2013 0500   LYMPHSABS 0.4* 07/18/2013 0835   LYMPHSABS 0.5* 07/11/2013 1835   MONOABS 1.3* 07/18/2013 0835   MONOABS 0.5 07/11/2013 1835   EOSABS 0.1 07/18/2013 0835   EOSABS 0.1 07/11/2013 1835   BASOSABS 0.0 07/18/2013 0835   BASOSABS 0.0 07/11/2013 1835     CMP     Component Value Date/Time   NA 131* 07/18/2013 0835   NA 131* 07/14/2013 0500   K 4.4 07/18/2013 0835   K 5.4* 07/14/2013 0500   CL 92* 07/14/2013 0500   CO2 25 07/18/2013 0835   CO2 26 07/14/2013 0500   GLUCOSE 90  07/18/2013 0835   GLUCOSE 106* 07/14/2013 0500   BUN 6.1* 07/18/2013 0835   BUN 15 07/14/2013 0500   CREATININE 0.7 07/18/2013 0835   CREATININE 0.80 07/14/2013 0500   CALCIUM 8.6 07/18/2013 0835   CALCIUM 8.8 07/14/2013 0500   PROT 5.9* 07/18/2013 0835   PROT 5.7* 07/11/2013 1835   ALBUMIN 2.4* 07/18/2013 0835   ALBUMIN 2.7* 07/11/2013 1835   AST 38* 07/18/2013 0835   AST 25 07/11/2013 1835   ALT 17 07/18/2013 0835   ALT 16 07/11/2013 1835   ALKPHOS 93 07/18/2013 0835   ALKPHOS 91 07/11/2013 1835   BILITOT 0.51 07/18/2013 0835   BILITOT 0.5 07/11/2013 1835   GFRNONAA >90 07/14/2013 0500   GFRAA >90 07/14/2013 0500     Impression:  The patient is tolerating radiotherapy but has recurrent pleural effusion on right, I suspect.   Plan:  Continue radiotherapy as planned. Suspect Recurrent R pleural effusion. White opacity in base of lung (right) seen in set-up non-diagnostic CT scan for radiation treatments today.  Suspect dehydration as well due to poor PO intake and lightheadedness. Suspect he would not be safe going home. Calling [facility] to arrange direct admission. May benefit from Pleurx catheter and will need labwork to assess blood counts /  electrolyes.  -----------------------------------  Eppie Gibson, MD

## 2013-07-22 NOTE — Telephone Encounter (Signed)
Called Fred [name] at [phone] number(s).  Message left requesting a return call for chemotherapy follow up.  Awaiting return call from patient.

## 2013-07-22 NOTE — Patient Instructions (Signed)
Follow up in 1 week for a symptom management visit with a repeat CXR to evaluate for reaccumulated pleural effusions

## 2013-07-22 NOTE — H&P (Signed)
[facility] History and Physical  [name] ZOX:[id number] DOB: [birth year] DOA: 07/22/2013  Referring physician: Dr Isidore Moos PCP: [name], MD   Chief Complaint: sob/chest tightness  HPI: Fred [name] is a 43 y.o. male  Unfortunate gentleman with history of metastatic esophageal adenocarcinoma with negative HER-2 diagnosed in January of 2015 status post T8-T11 spinal fusion per Dr. Vertell Limber, status post sterotactic radiation to T9 lesion per Dr. [name], systemic chemotherapy with EOX, systemic chemotherapy with cisplatin and irinotecan currently on systemic febrile with docetaxel and ramucirumab who was recently hospitalized 7/9 to 07/14/2013 for malignant pleural effusion. During this hospitalization patient had 2 thoracenteses procedures done on the right side with a total of 3.4 L removed. Patient presented to radiation oncologist office for routine under treatment assessment with complaints of shortness of breath, dry nonproductive cough, chest tightness right-sided greater than left. Patient was noted to be tachycardic with heart rates in the 130s and a blood pressure 130/84. Nondiagnostic CT scan of the radiation oncologist office showed a white opacity in the right lung base. Patient also with some complaints of lightheadedness with decreased oral intake. We were consulted to admit the patient for further evaluation and management. Patient states he is short of breath on exertion with right-sided chest tightness greater than left ongoing for the past 2 days with associated palpitations, chills, nausea, constipation, generalized weakness, dizziness. Patient does endorse a nonproductive cough which he states has been chronic. Patient denies any recent weight gain. Patient does endorse decreased oral intake. Patient denies any fevers, no diarrhea, no dysuria, no falls, no melena, no hematemesis, no hematochezia.    Review of Systems: As per history of present illness otherwise  negative. Constitutional:  No weight loss, night sweats, Fevers, chills, fatigue.  HEENT:  No headaches, Difficulty swallowing,Tooth/dental problems,Sore throat,  No sneezing, itching, ear ache, nasal congestion, post nasal drip,  Cardio-vascular:  No chest pain, Orthopnea, PND, swelling in lower extremities, anasarca, dizziness, palpitations  GI:  No heartburn, indigestion, abdominal pain, nausea, vomiting, diarrhea, change in bowel habits, loss of appetite  Resp:  No shortness of breath with exertion or at rest. No excess mucus, no productive cough, No non-productive cough, No coughing up of blood.No change in color of mucus.No wheezing.No chest wall deformity  Skin:  no rash or lesions.  GU:  no dysuria, change in color of urine, no urgency or frequency. No flank pain.  Musculoskeletal:  No joint pain or swelling. No decreased range of motion. No back pain.  Psych:  No change in mood or affect. No depression or anxiety. No memory loss.   Past Medical History  Diagnosis Date  . GERD (gastroesophageal reflux disease)     cannot taste anything  . 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 No pertinent past surgical history. Social History:  reports that he has never smoked. He has quit using smokeless tobacco. His smokeless tobacco use included Chew. He reports that he drinks alcohol. He reports that he does not use illicit drugs.  No Known Allergies  History reviewed. No pertinent family history.   Prior to Admission medications   Medication Sig Start Date End Date Taking? Authorizing Provider  gabapentin (NEURONTIN) 100 MG capsule Take 1 capsule (100 mg total) by mouth 3 (three) times daily. 04/29/13  Yes Curt Bears, MD  lidocaine-prilocaine (EMLA) cream Apply 1 application topically as needed (for port access (skin numbing)). Apply  to port 1 hr before chemo 03/13/13  Yes Si Gaul, MD   metoprolol tartrate (LOPRESSOR) 25 MG tablet Take 1 tablet (25 mg total) by mouth 2 (two) times daily. 07/14/13  Yes Clydia Llano, MD  morphine (MS CONTIN) 30 MG 12 hr tablet Take 30 mg by mouth every 12 (twelve) hours.   Yes Historical Provider, MD  oxyCODONE (OXY IR/ROXICODONE) 5 MG immediate release tablet Take 5-10 mg by mouth every 6 (six) hours as needed for severe pain.   Yes Historical Provider, MD   Physical Exam: Filed Vitals:   07/22/13 1200  BP: 110/80  Pulse: 129  Resp: 18  SpO2: 95%    Wt Readings from Last 3 Encounters:  07/22/13 100.245 kg (221 lb)  07/18/13 100.562 kg (221 lb 11.2 oz)  07/11/13 102.4 kg (225 lb 12 oz)    General:  Action. Well-developed well-nourished in no acute cardiopulmonary distress.  Eyes: PERRLA, EOMI, normal lids, irises & conjunctiva ENT: grossly normal hearing, lips & tongue Neck: no LAD, masses or thyromegaly Cardiovascular: Tachycardic, no m/r/g. Nonpitting LE edema. Telemetry: Sinus tachycardia Respiratory: Decreased breath sounds in the right base. No wheezing. Abdomen: soft, ntnd, positive bowel sounds, no rebound, no guarding Skin: no rash or induration seen on limited exam Musculoskeletal: grossly normal tone BUE/BLE Psychiatric: grossly normal mood and affect, speech fluent and appropriate Neurologic: Alert and oriented x3. Cranial nerves II through XII are grossly intact. Sensation is intact. Visual fields are intact. Gait not tested secondary to safety.           Labs on Admission:  Basic Metabolic Panel:  Recent Labs Lab 07/18/13 0835 07/22/13 1159  NA 131* 133*  K 4.4 4.1  CL  --  96  CO2 25 26  GLUCOSE 90 93  BUN 6.1* 8  CREATININE 0.7 0.63  CALCIUM 8.6 8.1*  MG  --  1.4*   Liver Function Tests:  Recent Labs Lab 07/18/13 0835 07/22/13 1159  AST 38* 74*  ALT 17 26  ALKPHOS 93 150*  BILITOT 0.51 0.7  PROT 5.9* 5.7*  ALBUMIN 2.4* 2.2*   No results found for this basename: LIPASE, AMYLASE,  in the  last 168 hours No results found for this basename: AMMONIA,  in the last 168 hours CBC:  Recent Labs Lab 07/18/13 0835 07/22/13 1159  WBC 3.6* 9.0  NEUTROABS 1.7 7.9*  HGB 9.4* 8.7*  HCT 27.8* 25.6*  MCV 89.3 87.7  PLT 368 343   Cardiac Enzymes:  Recent Labs Lab 07/22/13 1159  TROPONINI <0.30    BNP (last 3 results)  Recent Labs  07/11/13 1830 07/22/13 1159  PROBNP 111.5 174.1*   CBG: No results found for this basename: GLUCAP,  in the last 168 hours  Radiological Exams on Admission: No results found.  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 The patient was recently hospitalized with right-sided malignant pleural effusion status post thoracentesis x2. Cytology was positive for metastatic adenocarcinoma. Patient's etiology of  his shortness of breath likely secondary to recurrent malignant pleural effusion. Chest x-ray is pending. Preliminary result shows looking at the films look like a reaccumulation of the pleural effusion. Place patient on oxygen. Comprehensive metabolic profile without phosphatase of 150 albumin of 2.2 AST of 74 protein of 5.7 otherwise within normal limits. LDH was 397. Troponin less than 0.30. Pro BNP was 174. CBC with hemoglobin of 8.7 otherwise was within normal limits.  Will consult with CVTS for Pleurx catheter placement. If patient gets into acute respiratory distress will ask pulmonary for a  therapeutic thoracentesis. Discussed with  Marni Griffon, nurse practitioner with pulmonary.  #2 chest tightness Likely secondary to recurrent malignant pleural effusion. We'll cycle cardiac enzymes every 6 hours x3. Check EKG. Follow.  #3 sinus tachycardia Likely secondary to probable recurrent malignant pleural effusion. Will check a TSH. Cycle cardiac enzymes every 6 hours x3. Check a EKG. We'll place on home regimen of beta blocker. Gentle hydration. Follow.  #4 hypertension Stable. Continue home regimen of metoprolol.  #6 protein calorie malnutrition  #7 metastatic esophageal adenocarcinoma 1) status post T8-T10 spinal fusion under the care of Dr. Vertell Limber.  2) status post a stereotactic radiotherapy to the T9 lesion under the care of Dr. [name].  3) Systemic chemotherapy with EOX, (epirubicin, oxaliplatin and Xeloda) every 3 weeks. First dose on 03/18/2013, status post 3 cycles discontinued today secondary to disease progression.  4) systemic chemotherapy with cisplatin 30 mg/M2 and irinotecan 65 mg/M2 days 1 and 8 every 3 weeks. Status post 2 cycles  CURRENT THERAPY: systemic chemotherapy with docetaxel 75 mg/m2 and ramucirumab (Cyramza) 10 mg/kg. First cycle given on 07/19/2013. Status post one cycle.  Patient last chemotherapy was about 4 days ago. Will inform oncology of admission.  #8 dehydration Gentle hydration.  #9 prophylaxis PPI for GI prophylaxis. Lovenox for DVT prophylaxis.   Code Status: Full DVT Prophylaxis: Lovenox Family Communication: Updated patient and wife at bedside. Disposition Plan: Admit to SDU  Time spent: [place] MD [facility] Pager [phone]  **Disclaimer: This note may have been dictated with voice recognition software. Similar sounding words can inadvertently be transcribed and this note may contain transcription errors which may not have been corrected upon publication of note.**

## 2013-07-23 ENCOUNTER — Ambulatory Visit
Admission: RE | Admit: 2013-07-23 | Discharge: 2013-07-23 | Disposition: A | Discharge status: Home / Self Care | Payer: BC Managed Care – PPO | Source: Ambulatory Visit | Attending: Radiation Oncology | Admitting: Radiation Oncology

## 2013-07-23 ENCOUNTER — Inpatient Hospital Stay (HOSPITAL_COMMUNITY): Payer: BC Managed Care – PPO

## 2013-07-23 ENCOUNTER — Encounter (HOSPITAL_COMMUNITY): Payer: Self-pay

## 2013-07-23 ENCOUNTER — Ambulatory Visit: Payer: BC Managed Care – PPO

## 2013-07-23 DIAGNOSIS — C7951 Secondary malignant neoplasm of bone: Secondary | ICD-10-CM

## 2013-07-23 DIAGNOSIS — C7952 Secondary malignant neoplasm of bone marrow: Secondary | ICD-10-CM

## 2013-07-23 DIAGNOSIS — C787 Secondary malignant neoplasm of liver and intrahepatic bile duct: Secondary | ICD-10-CM

## 2013-07-23 DIAGNOSIS — I10 Essential (primary) hypertension: Secondary | ICD-10-CM

## 2013-07-23 DIAGNOSIS — J910 Malignant pleural effusion: Secondary | ICD-10-CM

## 2013-07-23 DIAGNOSIS — D649 Anemia, unspecified: Secondary | ICD-10-CM

## 2013-07-23 DIAGNOSIS — C155 Malignant neoplasm of lower third of esophagus: Secondary | ICD-10-CM

## 2013-07-23 DIAGNOSIS — I498 Other specified cardiac arrhythmias: Secondary | ICD-10-CM

## 2013-07-23 LAB — URINALYSIS, ROUTINE W REFLEX MICROSCOPIC
Glucose, UA: NEGATIVE mg/dL
HGB URINE DIPSTICK: NEGATIVE
Ketones, ur: NEGATIVE mg/dL
Leukocytes, UA: NEGATIVE
Nitrite: NEGATIVE
Protein, ur: NEGATIVE mg/dL
SPECIFIC GRAVITY, URINE: 1.021 (ref 1.005–1.030)
Urobilinogen, UA: 1 mg/dL (ref 0.0–1.0)
pH: 6 (ref 5.0–8.0)

## 2013-07-23 LAB — GLUCOSE, CAPILLARY: Glucose-Capillary: 111 mg/dL — ABNORMAL HIGH (ref 70–99)

## 2013-07-23 LAB — CBC
HCT: 24.2 % — ABNORMAL LOW (ref 39.0–52.0)
HEMOGLOBIN: 7.9 g/dL — AB (ref 13.0–17.0)
MCH: 29 pg (ref 26.0–34.0)
MCHC: 32.6 g/dL (ref 30.0–36.0)
MCV: 89 fL (ref 78.0–100.0)
Platelets: 278 10*3/uL (ref 150–400)
RBC: 2.72 MIL/uL — ABNORMAL LOW (ref 4.22–5.81)
RDW: 18.8 % — ABNORMAL HIGH (ref 11.5–15.5)
WBC: 5.3 10*3/uL (ref 4.0–10.5)

## 2013-07-23 LAB — BASIC METABOLIC PANEL
Anion gap: 10 (ref 5–15)
BUN: 8 mg/dL (ref 6–23)
CHLORIDE: 97 meq/L (ref 96–112)
CO2: 27 mEq/L (ref 19–32)
CREATININE: 0.58 mg/dL (ref 0.50–1.35)
Calcium: 7.9 mg/dL — ABNORMAL LOW (ref 8.4–10.5)
GFR calc non Af Amer: >90 mL/min (ref >90)
Glucose, Bld: 92 mg/dL (ref 70–99)
POTASSIUM: 3.7 meq/L (ref 3.7–5.3)
SODIUM: 134 meq/L — AB (ref 137–147)

## 2013-07-23 LAB — TSH: TSH: 1.02 u[IU]/mL (ref 0.350–4.500)

## 2013-07-23 LAB — PREPARE RBC (CROSSMATCH)

## 2013-07-23 MED ORDER — ENSURE COMPLETE PO LIQD
237.0000 mL | Freq: Three times a day (TID) | ORAL | Status: DC
  Administered 2013-07-23 – 2013-07-25 (×3): 237 mL via ORAL

## 2013-07-23 MED ORDER — BIOTENE DRY MOUTH MT LIQD
15.0000 mL | Freq: Two times a day (BID) | OROMUCOSAL | Status: DC
  Administered 2013-07-25: 15 mL via OROMUCOSAL

## 2013-07-23 MED ORDER — LIDOCAINE HCL 1 % IJ SOLN
INTRAMUSCULAR | Status: AC
  Administered 2013-07-23: 11:00:00
  Filled 2013-07-23: qty 20

## 2013-07-23 MED ORDER — METOPROLOL TARTRATE 50 MG PO TABS
50.0000 mg | ORAL_TABLET | Freq: Two times a day (BID) | ORAL | Status: DC
  Administered 2013-07-23 – 2013-07-25 (×5): 50 mg via ORAL
  Filled 2013-07-23: qty 1
  Filled 2013-07-23 (×3): qty 2
  Filled 2013-07-23 (×2): qty 1

## 2013-07-23 MED ORDER — DIPHENHYDRAMINE HCL 25 MG PO CAPS
25.0000 mg | ORAL_CAPSULE | Freq: Once | ORAL | Status: AC
  Administered 2013-07-23: 25 mg via ORAL
  Filled 2013-07-23: qty 1

## 2013-07-23 MED ORDER — ACETAMINOPHEN 325 MG PO TABS
650.0000 mg | ORAL_TABLET | Freq: Once | ORAL | Status: AC
  Administered 2013-07-23: 650 mg via ORAL
  Filled 2013-07-23: qty 2

## 2013-07-23 MED ORDER — FUROSEMIDE 10 MG/ML IJ SOLN
20.0000 mg | Freq: Once | INTRAMUSCULAR | Status: AC
  Administered 2013-07-23: 20 mg via INTRAVENOUS
  Filled 2013-07-23: qty 2

## 2013-07-23 NOTE — Evaluation (Signed)
SLP Cancellation Note  Patient Details Name: Fred [name] MRN: [id number] DOB: Jun 25, 1970   Cancelled treatment:       Reason Eval/Treat Not Completed: Patient at procedure or test/unavailable (pt with Critical care service NP having procedure, will reattempt eval another time)   Claudie Fisherman, [facility] [place]

## 2013-07-23 NOTE — Progress Notes (Signed)
[facility] PROGRESS NOTE  ANURAG SCARFO ZOX:[id number] DOB: 05/24/1970 DOA: 07/22/2013 PCP: [name], MD  Assessment/Plan: #1 recurrent malignant pleural effusion Status post Right thoracentesis with 1200 mm of port wine appearing pleural fluid per critical care today. Patient with clinical improvement. Patient has been seen by cardiothoracic surgery and patient has been scheduled for outpatient Pleurx catheter on Friday, 07/26/2013.  #2 symptomatic anemia Likely secondary to chemotherapy and radiation. Patient with no overt bleeding. Hemoglobin currently at 7.9. Will transfuse 2 units packed red blood cells. Follow H&H.  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  #4 debility PT/OT.  #5 sinus tachycardia Likely secondary to problems #1 and 2. TSH within normal limits and 1.020. Improved with increased dose beta blocker. Patient to be transfused 2 units packed red blood cells. Follow.  #6 protein calorie malnutrition  #7 dehydration Gentle hydration.  #8 hypertension Continue beta blocker.  #9 prophylaxis PPI for GI prophylaxis. Lovenox for DVT prophylaxis.   Code Status: Full Family Communication: Updated patient and wife at bedside. Disposition Plan: Home hopefully tomorrow or the next day.   Consultants:  Pulmonary and critical care medicine: Dr. Halford Chessman 07/22/2013  Cardiothoracic surgery: Dr. Roxan Hockey 07/22/2013  Procedures:  Thoracentesis 07/23/2013 per Marni Griffon, NP  Antibiotics:  None  HPI/Subjective: Patient states breathing has improved post thoracentesis. No complaints.  Objective: Filed Vitals:   07/23/13 1200  BP: 103/80  Pulse: 97  Temp:   Resp: 20    Intake/Output Summary (Last 24 hours) at 07/23/13 1224 Last data filed at 07/23/13 1200  Gross per 24 hour  Intake 1757.5 ml  Output    300 ml  Net 1457.5 ml   Filed Weights   07/23/13 0400  Weight: 100.699 kg  (222 lb)    Exam:   General:  NAD  Cardiovascular: RRR no m/r/g  Respiratory: Some bibasilar coarse breath sounds. Decreased breath sounds in the right base.  Abdomen: Soft/NT/ND/+BS  Musculoskeletal: No clubbing or cyanosis. Nonpitting edema.  Data Reviewed: Basic Metabolic Panel:  Recent Labs Lab 07/18/13 0835 07/22/13 1159 07/23/13 0415  NA 131* 133* 134*  K 4.4 4.1 3.7  CL  --  96 97  CO2 $Re'25 26 27  'usX$ GLUCOSE 90 93 92  BUN 6.1* 8 8  CREATININE 0.7 0.63 0.58  CALCIUM 8.6 8.1* 7.9*  MG  --  1.4*  --    Liver Function Tests:  Recent Labs Lab 07/18/13 0835 07/22/13 1159  AST 38* 74*  ALT 17 26  ALKPHOS 93 150*  BILITOT 0.51 0.7  PROT 5.9* 5.7*  ALBUMIN 2.4* 2.2*   No results found for this basename: LIPASE, AMYLASE,  in the last 168 hours No results found for this basename: AMMONIA,  in the last 168 hours CBC:  Recent Labs Lab 07/18/13 0835 07/22/13 1159 07/23/13 0415  WBC 3.6* 9.0 5.3  NEUTROABS 1.7 7.9*  --   HGB 9.4* 8.7* 7.9*  HCT 27.8* 25.6* 24.2*  MCV 89.3 87.7 89.0  PLT 368 343 278   Cardiac Enzymes:  Recent Labs Lab 07/22/13 1159 07/22/13 1820 07/22/13 2242  TROPONINI <0.30 <0.30 <0.30   BNP (last 3 results)  Recent Labs  07/11/13 1830 07/22/13 1159  PROBNP 111.5 174.1*   CBG:  Recent Labs Lab 07/23/13 0746  GLUCAP 111*    Recent Results (from the past 240 hour(s))  MRSA PCR SCREENING     Status: None   Collection Time  07/22/13 12:05 PM      Result Value Ref Range Status   MRSA by PCR NEGATIVE  NEGATIVE Final   Comment:            The GeneXpert MRSA Assay (FDA     approved for NASAL specimens     only), is one component of a     comprehensive MRSA colonization     surveillance program. It is not     intended to diagnose MRSA     infection nor to guide or     monitor treatment for     MRSA infections.     Studies: Dg Chest 2 View  07/22/2013   CLINICAL DATA:  Shortness of breath.  Metastatic disease.   EXAM: CHEST  2 VIEW  COMPARISON:  Chest x-ray 07/14/2013.  FINDINGS: Power port noted with tip in superior vena cava. Right lower lobe atelectasis and or infiltrate and right-sided pleural effusion again noted. No interim change. No pneumothorax. Heart size stable. No acute osseus abnormality. Prior thoracic spine fusion. Surgical clips right upper quadrant.  IMPRESSION: Stable right lower lobe atelectasis and/or consolidation with stable prominent right pleural effusion.   Electronically Signed   By: Marcello Moores  Register   On: 07/22/2013 13:38   Dg Chest Port 1 View  07/23/2013   CLINICAL DATA:  Status post thoracentesis.  EXAM: PORTABLE CHEST - 1 VIEW  COMPARISON:  07/22/2013  FINDINGS: The power port is stable. There is a persistent moderate to large right pleural effusion with overlying atelectasis. No pneumothorax. The left lung is clear. No definite left-sided pleural effusion.  IMPRESSION: Stable moderate to large right pleural effusion with overlying atelectasis. No pneumothorax.   Electronically Signed   By: Kalman Jewels M.D.   On: 07/23/2013 11:39    Scheduled Meds: . docusate sodium  100 mg Oral BID  . enoxaparin (LOVENOX) injection  40 mg Subcutaneous Q24H  . gabapentin  100 mg Oral TID  . metoprolol tartrate  50 mg Oral BID  . morphine  30 mg Oral Q12H  . pantoprazole  40 mg Oral Daily  . sodium chloride  3 mL Intravenous Q12H   Continuous Infusions: . 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 If 7PM-7AM, please contact night-coverage at [email or web address], password [facility] 07/23/2013, 12:24 PM  LOS: 1 day

## 2013-07-23 NOTE — Progress Notes (Signed)
Fred [name]   DOB:Nov 29, 1970   NW#:[id number]   MVH#:[id number]  Subjective: Fred [name] is a 43 year old male with a history of  Metastatic esophageal adenocarcinoma listed below, status post cycle 1 with docetaxel and ramucirumab on 07/19/2013 with Neulasta on day 2, as well as undergoing radiation, admitted on 07/22/2013 with increasing shortness of breath, fatigue, and right sided, pleuritic chest pain.he denies any fever, chills or night sweats. He denies any cardiac complaints, nausea, vomiting, bowel changes, or urinary symptoms.he had no confusion. The patient has chronic dysphagia. Of note, the patient had a recent thoracentesis on 7/12 due to increasing pleural effusion, which per cytology was malignant.On presentation,a new chest x-ray demonstrated recurrent right-sided pleural effusion.TCTS to see the patient in consultation regarding Pleurx catheter insertion due to recurrent infusion.  We have been kindly informed of the patient's admission to help in the management from the oncological standpoint.  Oncological History:  DIAGNOSIS: Metastatic esophageal adenocarcinoma with Negative HER-2 diagnosed in January of 2015.   MOLECULAR BIOMARKERS: Foundation one: Amplification of PIK3CA, RAF1, CCND1, MYC(equivocal), PRKCl, SOX2, EPHB1, FGF19, FGF3, FGF4, GATA6, TERC and [id number], UV25D66.   PRIOR THERAPY:  1) status post T8-T10 spinal fusion under the care of Dr. Vertell Limber.  2) status post a stereotactic radiotherapy to the T9 lesion under the care of Dr. [name].  3) Systemic chemotherapy with EOX, (epirubicin, oxaliplatin and Xeloda) every 3 weeks. First dose on 03/18/2013, status post 3 cycles discontinued today secondary to disease progression.  4) systemic chemotherapy with cisplatin 30 mg/M2 and irinotecan 65 mg/M2 days 1 and 8 every 3 weeks. Status post 2 cycles   CURRENT THERAPY: systemic chemotherapy with docetaxel 75 mg/m2 and ramucirumab (Cyramza) 10 mg/kg. First cycle given on 07/19/2013.  Status post one cycle.     Scheduled Meds: . docusate sodium  100 mg Oral BID  . enoxaparin (LOVENOX) injection  40 mg Subcutaneous Q24H  . gabapentin  100 mg Oral TID  . metoprolol tartrate  50 mg Oral BID  . morphine  30 mg Oral Q12H  . pantoprazole  40 mg Oral Daily  . sodium chloride  3 mL Intravenous Q12H   Continuous Infusions: . sodium chloride 75 mL/hr at 07/23/13 0623   PRN Meds:.acetaminophen, acetaminophen, ipratropium, levalbuterol, lidocaine-prilocaine, ondansetron (ZOFRAN) IV, ondansetron, oxyCODONE, polyethylene glycol, sorbitol   Objective:  Filed Vitals:   07/23/13 0800  BP:   Pulse:   Temp: 98.1 F (36.7 C)  Resp:       Intake/Output Summary (Last 24 hours) at 07/23/13 4403 Last data filed at 07/23/13 0600  Gross per 24 hour  Intake 1307.5 ml  Output    300 ml  Net 1007.5 ml     GENERAL:alert, no distress and comfortable SKIN: skin color, texture, turgor are normal, no rashes or significant lesions. Alopecia EYES: normal, conjunctiva are pink and non-injected, sclera clear OROPHARYNX:no exudate, no erythema and lips, buccal mucosa, and tongue normal  NECK: supple, thyroid normal size, non-tender, without nodularity LYMPH: palpable shotty left supraclavicular lymph node. no palpable lymphadenopathy in the cervical, axillary or inguinal LUNGS:decreased breath sounds at the bases, right greater than left HEART:tachycardic,regular rate & rhythm and no murmurs and no lower extremity edema ABDOMEN:abdomen soft, non-tender and normal bowel sounds Musculoskeletal:no cyanosis of digits and no clubbing  PSYCH: alert & oriented x 3 with fluent speech NEURO: no focal motor/sensory deficits    CBG (last 3)   Recent Labs  07/23/13 0746  GLUCAP 111*  Labs:   Recent Labs Lab 07/18/13 0835 07/22/13 1159 07/23/13 0415  WBC 3.6* 9.0 5.3  HGB 9.4* 8.7* 7.9*  HCT 27.8* 25.6* 24.2*  PLT 368 343 278  MCV 89.3 87.7 89.0  MCH 30.1 29.8 29.0  MCHC  33.7 34.0 32.6  RDW 19.2* 18.4* 18.8*  LYMPHSABS 0.4* 0.2*  --   MONOABS 1.3* 0.4  --   EOSABS 0.1 0.0  --   BASOSABS 0.0 0.5*  --      Chemistries:    Recent Labs Lab 07/18/13 0835 07/22/13 1159 07/23/13 0415  NA 131* 133* 134*  K 4.4 4.1 3.7  CL  --  96 97  CO2 $Re'25 26 27  'LyR$ GLUCOSE 90 93 92  BUN 6.1* 8 8  CREATININE 0.7 0.63 0.58  CALCIUM 8.6 8.1* 7.9*  MG  --  1.4*  --   AST 38* 74*  --   ALT 17 26  --   ALKPHOS 93 150*  --   BILITOT 0.51 0.7  --     GFR The CrCl is unknown because both a height and weight (above a minimum accepted value) are required for this calculation.  Liver Function Tests:  Recent Labs Lab 07/18/13 0835 07/22/13 1159  AST 38* 74*  ALT 17 26  ALKPHOS 93 150*  BILITOT 0.51 0.7  PROT 5.9* 5.7*  ALBUMIN 2.4* 2.2*    Urine Studies     Component Value Date/Time   COLORURINE AMBER* 07/23/2013 0037   APPEARANCEUR CLOUDY* 07/23/2013 0037   LABSPEC 1.021 07/23/2013 0037   PHURINE 6.0 07/23/2013 0037   GLUCOSEU NEGATIVE 07/23/2013 0037   HGBUR NEGATIVE 07/23/2013 0037   BILIRUBINUR SMALL* 07/23/2013 0037   KETONESUR NEGATIVE 07/23/2013 0037   PROTEINUR NEGATIVE 07/23/2013 0037   UROBILINOGEN 1.0 07/23/2013 0037   NITRITE NEGATIVE 07/23/2013 0037   LEUKOCYTESUR NEGATIVE 07/23/2013 0037      Cardiac Enzymes:  Recent Labs Lab 07/22/13 1159 07/22/13 1820 07/22/13 2242  TROPONINI <0.30 <0.30 <0.30   CBG:  Recent Labs Lab 07/23/13 0746  GLUCAP 111*     Recent Labs  07/22/13 1820  TSH 1.020   Microbiology Recent Results (from the past 240 hour(s))  MRSA PCR SCREENING     Status: None   Collection Time    07/22/13 12:05 PM      Result Value Ref Range Status   MRSA by PCR NEGATIVE  NEGATIVE Final   Comment:            The GeneXpert MRSA Assay (FDA     approved for NASAL specimens     only), is one component of a     comprehensive MRSA colonization     surveillance program. It is not     intended to diagnose MRSA      infection nor to guide or     monitor treatment for     MRSA infections.       Imaging Studies:  Dg Chest 2 View  07/22/2013   CLINICAL DATA:  Shortness of breath.  Metastatic disease.  EXAM: CHEST  2 VIEW  COMPARISON:  Chest x-ray 07/14/2013.  FINDINGS: Power port noted with tip in superior vena cava. Right lower lobe atelectasis and or infiltrate and right-sided pleural effusion again noted. No interim change. No pneumothorax. Heart size stable. No acute osseus abnormality. Prior thoracic spine fusion. Surgical clips right upper quadrant.  IMPRESSION: Stable right lower lobe atelectasis and/or consolidation with stable prominent right pleural effusion.  Electronically Signed   By: Marcello Moores  Register   On: 07/22/2013 13:38    Assessment/Plan: 43 y.o.  1. Metastatic esophageal adenocarcinoma with Negative HER-2 diagnosed in January of 2015.   most recent chemotherapy with cycle 1 of docetaxel 75 mg/m2 and ramucirumab (Cyramza) 10 mg/kg. First cycle given on 07/19/2013,with Neulasta on day 2 Next chemotherapy is scheduled for 08/09/13 if clinically stable Radiation to the esophagus to be completed soon  2. Malignant bilateral pleural effusion Status post right thoracentesis on 07/14/2013 with recurrence. TCTS to see the patient regarding Pleurx catheter insertion to prevent further hospitalizations regarding this issue.  3.Symptomatic anemia Recommend transfusion of 2 units of blood for hemoglobin of 7.9  4.Debilitation Secondary to weight loss, poor oral intake, as well as symptomatic anemia a malignant pleural effusion Recommend OT PT consultation to help with ambulation  5. DVT prophylaxis Lovenox  6. Full Code   **Disclaimer: This note was dictated with voice recognition software. Similar sounding words can inadvertently be transcribed and this note may contain transcription errors which may not have been corrected upon publication of note.** Sharene Butters E, PA-C 07/23/2013   8:21 AM  ADDENDUM:  Hematology/Oncology Attending:  The patient is seen and examined today. I agree with the above note. This is a very pleasant 43 years old white male with metastatic distal esophageal/gastroesophageal adenocarcinoma currently undergoing second line chemotherapy with docetaxel and Cyramza status post 1 cycle given on 07/19/2013. He was admitted yesterday with worsening dyspnea secondary to recurrent right pleural effusion. The patient was admitted to the medical ICU for evaluation. He is expected to have right-sided thoracentesis today.  He would have a followup appointment with a cardiothoracic surgeon soon after discharge for consideration of Pleurx catheter placement. He is currently at risk for delayed healing of surgical wound secondary to her recent treatment with Cyramza which has antiangiogenic effects and any elective procedure such as Pleurx catheter placement should be delayed for at least a week unless the patient is very symptomatic. The patient has chemotherapy-induced anemia and he may benefit from 2 units of PRBCs transfusion before discharge. Thank you for taking good care of Mr. Machuca. I will continue to follow the patient with you and assist in his management an as-needed basis.

## 2013-07-23 NOTE — Progress Notes (Signed)
PT Cancellation Note  Patient Details Name: Fred [name] MRN: [id number] DOB: 01/31/1970   Cancelled Treatment:    Reason Eval/Treat Not Completed: Patient at procedure or test/unavailable, in procedure in AM, radiation in PM. PT will return  Tomorrow for EVal.   Claretha Cooper 07/23/2013, 3:25 PM [name] PT [phone]

## 2013-07-23 NOTE — Progress Notes (Signed)
INITIAL NUTRITION ASSESSMENT  Pt meets criteria for severe MALNUTRITION in the context of chronic illness as evidenced by <75% estimated energy intake with 9.7% weight loss in the past 3 months.  DOCUMENTATION CODES Per approved criteria  -Severe malnutrition in the context of chronic illness   INTERVENTION: - Ensure Complete TID - Educated pt and wife on high calorie/protein diet therapy, provided handouts, and RD contact information. Gave them [facility] RD contact information.  - RD to continue to monitor   NUTRITION DIAGNOSIS: Increased nutrient needs related to metastatic esophageal adenocarcinoma as evidenced by MD notes.   Goal: Pt to consume >90% of meals/supplements  Monitor:  Weights, labs, intake   Reason for Assessment: Malnutrition screening tool   43 y.o. male  Admitting Dx: Malignant pleural effusion  ASSESSMENT: Pt with history of metastatic esophageal adenocarcinoma getting outpatient radiation and chemotherapy (s/p 1 cycle of chemotherapy), who was recently hospitalized 7/9 to 07/14/2013 for malignant pleural effusion. Patient presented to radiation oncologist office for routine under treatment assessment with complaints of shortness of breath, dry nonproductive cough, chest tightness right-sided greater than left. Patient was noted to be tachycardic. Found to have current pleural effusion  - Met with pt and wife who report pt has been consuming a lot of soft foods likes soups, beef broth, chocolate milk, peaches, jello, cereal with whole milk, and yogurts - Pt states if he tries to eat more solid food, he feels like the food is getting stuck in his esophagus and has to swallow a few times to feel like the food is going down  - Drinks 2 Ensure Plus/day - Weight down 22 pounds in the past 3 months  Alk phos and AST elevated    Height: Ht Readings from Last 1 Encounters:  07/18/13 6' (1.829 m)    Weight: Wt Readings from Last 1 Encounters:   07/23/13 222 lb (100.699 kg)    Ideal Body Weight: 178 lbs   % Ideal Body Weight: 125%  Wt Readings from Last 10 Encounters:  07/23/13 222 lb (100.699 kg)  07/22/13 221 lb (100.245 kg)  07/18/13 221 lb 11.2 oz (100.562 kg)  07/11/13 225 lb 12 oz (102.4 kg)  07/11/13 223 lb 3.2 oz (101.243 kg)  07/04/13 219 lb 9.6 oz (99.61 kg)  06/21/13 220 lb 6.4 oz (99.973 kg)  05/22/13 230 lb 14.4 oz (104.736 kg)  04/29/13 234 lb (106.142 kg)  04/08/13 246 lb 14.4 oz (111.993 kg)    Usual Body Weight: 246 lbs in April 2015   % Usual Body Weight: 90%  BMI:  Body mass index is 30.1 kg/(m^2). Class I obesity   Estimated Nutritional Needs: Kcal: 2300-2500  Protein: 105-120 gram  Fluid: >/=2300 ml/daily   Skin: +1 generalized edema, +1 RLE, LLE edema  Diet Order: Criss Rosales  EDUCATION NEEDS: -Education needs addressed -  discussed and provided handouts on high calorie/protein diet with RD contact information    Intake/Output Summary (Last 24 hours) at 07/23/13 1152 Last data filed at 07/23/13 1100  Gross per 24 hour  Intake 1682.5 ml  Output    300 ml  Net 1382.5 ml    Last BM: 7/21  Labs:   Recent Labs Lab 07/18/13 0835 07/22/13 1159 07/23/13 0415  NA 131* 133* 134*  K 4.4 4.1 3.7  CL  --  96 97  CO2 $Re'25 26 27  'cCz$ BUN 6.1* 8 8  CREATININE 0.7 0.63 0.58  CALCIUM 8.6 8.1* 7.9*  MG  --  1.4*  --   GLUCOSE 90 93 92    CBG (last 3)   Recent Labs  07/23/13 0746  GLUCAP 111*    Scheduled Meds: . docusate sodium  100 mg Oral BID  . enoxaparin (LOVENOX) injection  40 mg Subcutaneous Q24H  . gabapentin  100 mg Oral TID  . metoprolol tartrate  50 mg Oral BID  . morphine  30 mg Oral Q12H  . pantoprazole  40 mg Oral Daily  . sodium chloride  3 mL Intravenous Q12H    Continuous Infusions: . sodium chloride 75 mL/hr at 07/23/13 0177    Past Medical History  Diagnosis Date  . GERD (gastroesophageal reflux disease)     cannot taste anything  . Presumed to spinal  cord metastasis to T9 and possibly T5 01/31/2013  . 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 No pertinent past surgical history.  Carlis Stable MS, [place], LDN [phone] Pager [phone] Weekend/After Hours Pager

## 2013-07-23 NOTE — Procedures (Signed)
Thoracentesis Procedure Note  Pre-operative Diagnosis: effusion   Post-operative Diagnosis: same  Indications: dyspnea   Procedure Details  Consent: Informed consent was obtained. Risks of the procedure were discussed including: infection, bleeding, pain, pneumothorax.  Under sterile conditions the patient was positioned. Betadine solution and sterile drapes were utilized.  1% buffered lidocaine was used to anesthetize the pleural space which was identified via real time [place]. Noted to be loculated laterally w/ fibrinous stranding. Fluid was obtained without any difficulties and minimal blood loss.  A dressing was applied to the wound and wound care instructions were provided.     Findings 1200 ml of bloody/ port wine appearing pleural fluid was obtained. No samples sent as this was for therapeutic purpose only.  Complications:  None; patient tolerated the procedure well.          Condition: stable  Plan A follow up chest x-ray was ordered. Bed Rest for 0 hours. Tylenol 650 mg. for pain.  Attending Attestation: I was present and scrubbed for the entire procedure.  Chesley Mires, MD [facility] Pulmonary/Critical Care 07/23/2013, 12:02 PM Pager:  [phone] After 3pm call: [phone]

## 2013-07-23 NOTE — Progress Notes (Signed)
[facility] Therapy Treatment Record Phone [phone]   Radiation Therapy was administered to Fred [name] on: 07/23/2013  3:04 PM and was treatment # 13 out of a planned course of 14 treatments.

## 2013-07-23 NOTE — Progress Notes (Signed)
OT Cancellation Note  Patient Details Name: Fred [name] MRN: [id number] DOB: 1970-08-14   Cancelled Treatment:    Reason Eval/Treat Not Completed: Patient at procedure or test/ unavailable . Per nursing getting ready for thoracentesis. Will check back later time.  Jules Schick [phone] 07/23/2013, 10:26 AM

## 2013-07-23 NOTE — Evaluation (Signed)
Clinical/Bedside Swallow Evaluation Patient Details  Name: Fred [name] MRN: [id number] Date of Birth: Nov 29, 1970  Today's Date: 07/23/2013 Time: 1215-1244 SLP Time Calculation (min): 29 min  Past Medical History:  Past Medical History  Diagnosis Date  . GERD (gastroesophageal reflux disease)     cannot taste anything  . 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 Metastatic cancer to spine 02/14/2013   Past Surgical History: History reviewed. No pertinent past surgical history. HPI:  43 yo male adm to [facility] with shortness of breath - - diagnosed with bilateral pleural effusion s/p thoracentesis today.  Pt has stage IV esophageal cancer and is undergoing chemoradiation.  CXR 7/20 with stable right LLL ATX and/or consolidation with stable right pleural effusion.  Swallow evaluation ordered.    Assessment / Plan / Recommendation Clinical Impression  Pt presents with functional oropharyngeal swallow based on clinical evaluation.  He reports h/o becoming "choked" on food/drink when he senses esophagus is full.  Pt is masticating foods completely and uses liquid washes at times to facilitate clerance.  Pt does admit swallow has improved with radiation tx as it is hopefully decreasing distal esophageal tumor.  He has been trying solid foods that he masticates completely.   SLP provided education for xerostomia and mucositis/esophagitis compensations and dysphagia mitigation strategies.  All education completed and pt managing his esophageal dysphagia well. Recommend continue diet as tolerated as pt is acutely aware of his dysphagia.   Thanks for this referral.     Aspiration Risk  Mild    Diet Recommendation Dysphagia 3 (Mechanical Soft);Thin liquid (as tolerated)   Liquid Administration via: Cup Medication Administration:  (crush if large and problematic and not contraindicated ) Supervision: Patient able to self feed Compensations: Slow rate;Follow solids with  liquid (start meal with liquids, consume small amounts throughout the day) Postural Changes and/or Swallow Maneuvers: Seated upright 90 degrees;Upright 30-60 min after meal    Other  Recommendations Oral Care Recommendations: Oral care BID   Follow Up Recommendations  None    Frequency and Duration   n/a     Pertinent Vitals/Pain Afebrile, decreased    Swallow Study Prior Functional Status   see [place] Date of Onset: 07/23/13 HPI: 43 yo male adm to [facility] with shortness of breath - - diagnosed with bilateral pleural effusion s/p thoracentesis today.  Pt has stage IV esophageal cancer and is undergoing chemoradiation.  CXR 7/20 with stable right LLL ATX and/or consolidation with stable right pleural effusion.  Swallow evaluation ordered.  Type of Study: Bedside swallow evaluation Diet Prior to this Study: Dysphagia 3 (soft);Thin liquids Temperature Spikes Noted: No Respiratory Status: Nasal cannula History of Recent Intubation: No Behavior/Cognition: Alert;Cooperative;Pleasant mood Oral Cavity - Dentition: Adequate natural dentition Self-Feeding Abilities: Able to feed self Baseline Vocal Quality: Clear Volitional Cough: Other (Comment) (did not test pt just had thoracentesis today) Volitional Swallow: Able to elicit    Oral/Motor/Sensory Function Overall Oral Motor/Sensory Function: Appears within functional limits for tasks assessed   Ice Chips Ice chips: Not tested (pt reports not consuming ice since chemo tx)   Thin Liquid Thin Liquid: Within functional limits Presentation: Cup Other Comments: minimal delayed swallow initiation    Nectar Thick Nectar Thick Liquid: Not tested   Honey Thick Honey Thick Liquid: Not tested   Puree Puree: Within functional limits Presentation: Self Fed;Spoon   Solid   GO    Solid: Not tested  Luanna Salk, [facility] SLP [phone]

## 2013-07-24 ENCOUNTER — Ambulatory Visit
Admission: RE | Admit: 2013-07-24 | Discharge: 2013-07-24 | Disposition: A | Discharge status: Home / Self Care | Payer: BC Managed Care – PPO | Source: Ambulatory Visit | Attending: Radiation Oncology | Admitting: Radiation Oncology

## 2013-07-24 ENCOUNTER — Encounter: Payer: Self-pay | Admitting: Radiation Oncology

## 2013-07-24 ENCOUNTER — Ambulatory Visit: Payer: BC Managed Care – PPO

## 2013-07-24 DIAGNOSIS — C155 Malignant neoplasm of lower third of esophagus: Secondary | ICD-10-CM

## 2013-07-24 LAB — TYPE AND SCREEN
ABO/RH(D): O POS
Antibody Screen: NEGATIVE
UNIT DIVISION: 0
Unit division: 0

## 2013-07-24 LAB — CBC
HEMATOCRIT: 27.4 % — AB (ref 39.0–52.0)
Hemoglobin: 9.4 g/dL — ABNORMAL LOW (ref 13.0–17.0)
MCH: 29.7 pg (ref 26.0–34.0)
MCHC: 34.3 g/dL (ref 30.0–36.0)
MCV: 86.4 fL (ref 78.0–100.0)
Platelets: 201 10*3/uL (ref 150–400)
RBC: 3.17 MIL/uL — AB (ref 4.22–5.81)
RDW: 17.6 % — ABNORMAL HIGH (ref 11.5–15.5)
WBC: 2.9 10*3/uL — AB (ref 4.0–10.5)

## 2013-07-24 LAB — BASIC METABOLIC PANEL
Anion gap: 10 (ref 5–15)
BUN: 9 mg/dL (ref 6–23)
CHLORIDE: 97 meq/L (ref 96–112)
CO2: 27 meq/L (ref 19–32)
CREATININE: 0.56 mg/dL (ref 0.50–1.35)
Calcium: 7.9 mg/dL — ABNORMAL LOW (ref 8.4–10.5)
GFR calc Af Amer: >90 mL/min (ref >90)
GFR calc non Af Amer: >90 mL/min (ref >90)
Glucose, Bld: 81 mg/dL (ref 70–99)
Potassium: 3.8 mEq/L (ref 3.7–5.3)
Sodium: 134 mEq/L — ABNORMAL LOW (ref 137–147)

## 2013-07-24 LAB — GLUCOSE, CAPILLARY: Glucose-Capillary: 80 mg/dL (ref 70–99)

## 2013-07-24 LAB — URINE CULTURE
CULTURE: NO GROWTH
Colony Count: NO GROWTH

## 2013-07-24 MED ORDER — GI COCKTAIL ~~LOC~~
30.0000 mL | Freq: Three times a day (TID) | ORAL | Status: DC | PRN
  Filled 2013-07-24: qty 30

## 2013-07-24 MED ORDER — MORPHINE SULFATE (CONCENTRATE) 10 MG /0.5 ML PO SOLN
10.0000 mg | ORAL | Status: DC | PRN
  Administered 2013-07-24: 10 mg via ORAL
  Filled 2013-07-24: qty 0.5

## 2013-07-24 MED ORDER — SUCRALFATE 1 GM/10ML PO SUSP
1.0000 g | Freq: Three times a day (TID) | ORAL | Status: DC
  Administered 2013-07-24 – 2013-07-25 (×4): 1 g via ORAL
  Filled 2013-07-24 (×6): qty 10

## 2013-07-24 MED ORDER — PANTOPRAZOLE SODIUM 40 MG PO PACK
40.0000 mg | PACK | Freq: Two times a day (BID) | ORAL | Status: DC
  Administered 2013-07-24 – 2013-07-25 (×2): 40 mg via ORAL
  Filled 2013-07-24 (×4): qty 20

## 2013-07-24 NOTE — Progress Notes (Signed)
Patient ID: Fred [name], male   DOB: 1970-02-02, 43 y.o.   MRN: [id number]  [facility] PROGRESS NOTE  KEWAN MCNEASE YFV:[id number] DOB: April 19, 1970 DOA: 07/22/2013 PCP: [name], MD  Brief narrative: 43 y.o. 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 Vertell Limber, status post sterotactic radiation to T9 lesion per Dr. [name], systemic chemotherapy with EOX, systemic chemotherapy with cisplatin and irinotecan currently on systemic tx with docetaxel and ramucirumab who was recently hospitalized 7/9 to 07/14/2013 for malignant pleural effusion. During this hospitalization patient had 2 thoracenteses procedures done on the right side with a total of 3.4 L removed. This time, patient presented to radiation oncologist office for assessment of shortness of breath, dry nonproductive cough, chest tightness. Patient was noted to be tachycardic with heart rates in the 130s and a blood pressure 130/84.   Assessment and Plan: Recurrent malignant pleural effusion   Status post Right thoracentesis with 1200 mm of port wine appearing pleural fluid per critical care today.   Patient with clinical improvement. Patient has been seen by cardiothoracic surgery   patient has been scheduled for outpatient Pleurx catheter on Friday, 07/26/2013.  Symptomatic anemia   Likely secondary to chemotherapy and radiation.   Patient with no overt bleeding. Hemoglobin 7.9 --> 9.4 after 2 U PRBC transfusion July 21st, 2015  Metastatic esophageal adenocarcinoma with negative HER-2 diagnosed in January of 2015   Per oncology.  Functional quadriplegia    PT/OT.  Sinus tachycardia   Likely secondary to problems #1 and 2.   TSH within normal limits and 1.020.   Improved with increased dose beta blocker. Protein calorie malnutrition .   Advance diet as pt able to tolerate  Hypertension   Continue beta blocker.  #9  prophylaxis   PPI for GI prophylaxis. Lovenox for DVT prophylaxis.   Code Status: Full  Family Communication: Updated patient and wife at bedside.  Disposition Plan: Home hopefully tomorrow or the next day.   Consultants:  Pulmonary and critical care medicine: Dr. Halford Chessman 07/22/2013  Cardiothoracic surgery: Dr. Roxan Hockey 07/22/2013 Procedures:  Thoracentesis 07/23/2013 per Marni Griffon, NP Antibiotics:  None  HPI/Subjective: No events overnight.   Objective: Filed Vitals:   07/24/13 1132 07/24/13 1200 07/24/13 1300 07/24/13 1437  BP:  107/76 109/84 130/75  Pulse:  87 100 112  Temp: 98.6 F (37 C)   98.6 F (37 C)  TempSrc: Oral   Oral  Resp:  $Remo'18 21 18  'WgYJL$ Weight:      SpO2:  100% 100% 100%    Intake/Output Summary (Last 24 hours) at 07/24/13 1645 Last data filed at 07/24/13 0800  Gross per 24 hour  Intake    610 ml  Output    600 ml  Net     10 ml    Exam:   General:  Pt is alert, follows commands appropriately, not in acute distress  Cardiovascular: Regular rhythm, tachycardic, S1/S2, no murmurs, no rubs, no gallops  Respiratory: Clear to auscultation bilaterally, no wheezing, no crackles, no rhonchi  Abdomen: Soft, non tender, non distended, bowel sounds present, no guarding  Extremities: No edema, pulses DP and PT palpable bilaterally   Data Reviewed: Basic Metabolic Panel:  Recent Labs Lab 07/18/13 0835 07/22/13 1159 07/23/13 0415 07/24/13 0352  NA 131* 133* 134* 134*  K 4.4 4.1 3.7 3.8  CL  --  96 97 97  CO2 $Re'25 26 27 27  'tGX$ GLUCOSE  90 93 92 81  BUN 6.1* $Remove'8 8 9  'wHZBraP$ CREATININE 0.7 0.63 0.58 0.56  CALCIUM 8.6 8.1* 7.9* 7.9*  MG  --  1.4*  --   --    Liver Function Tests:  Recent Labs Lab 07/18/13 0835 07/22/13 1159  AST 38* 74*  ALT 17 26  ALKPHOS 93 150*  BILITOT 0.51 0.7  PROT 5.9* 5.7*  ALBUMIN 2.4* 2.2*   CBC:  Recent Labs Lab 07/18/13 0835 07/22/13 1159 07/23/13 0415 07/24/13 0352  WBC 3.6* 9.0 5.3 2.9*  NEUTROABS 1.7 7.9*   --   --   HGB 9.4* 8.7* 7.9* 9.4*  HCT 27.8* 25.6* 24.2* 27.4*  MCV 89.3 87.7 89.0 86.4  PLT 368 343 278 201   Cardiac Enzymes:  Recent Labs Lab 07/22/13 1159 07/22/13 1820 07/22/13 2242  TROPONINI <0.30 <0.30 <0.30   CBG:  Recent Labs Lab 07/23/13 0746 07/24/13 0802  GLUCAP 111* 80    Recent Results (from the past 240 hour(s))  MRSA PCR SCREENING     Status: None   Collection Time    07/22/13 12:05 PM      Result Value Ref Range Status   MRSA by PCR NEGATIVE  NEGATIVE Final   Comment:            The GeneXpert MRSA Assay (FDA     approved for NASAL specimens     only), is one component of a     comprehensive MRSA colonization     surveillance program. It is not     intended to diagnose MRSA     infection nor to guide or     monitor treatment for     MRSA infections.  URINE CULTURE     Status: None   Collection Time    07/23/13 12:37 AM      Result Value Ref Range Status   Specimen Description URINE, RANDOM   Final   Special Requests NONE   Final   Culture  Setup Time     Final   Value: 07/23/2013 04:11     Performed at [place]     Final   Value: NO GROWTH     Performed at [facility]   Culture     Final   Value: NO GROWTH     Performed at [facility]   Report Status 07/24/2013 FINAL   Final     Scheduled Meds: . antiseptic oral rinse  15 mL Mouth Rinse BID  . docusate sodium  100 mg Oral BID  . enoxaparin (LOVENOX) injection  40 mg Subcutaneous Q24H  . feeding supplement (ENSURE COMPLETE)  237 mL Oral TID BM  . gabapentin  100 mg Oral TID  . metoprolol tartrate  50 mg Oral BID  . morphine  30 mg Oral Q12H  . pantoprazole  40 mg Oral Daily  . sodium chloride  3 mL Intravenous Q12H   Continuous Infusions:    Faye Ramsay, MD  [facility] Pager [phone]  If 7PM-7AM, please contact night-coverage [email or web address] Password [email or web address] 07/24/2013, 4:45 PM   LOS: 2 days

## 2013-07-24 NOTE — Progress Notes (Signed)
[place] [facility] Dept Therapy Treatment Record Phone [phone]   Radiation Therapy was administered to Fred [name] on: 07/24/2013  10:20 AM and was treatment # 14 out of a planned course of 14 treatments.

## 2013-07-24 NOTE — Progress Notes (Signed)
OT Cancellation Note  Patient Details Name: DAXTYN ROTTENBERG MRN: [id number] DOB: February 11, 1970   Cancelled Treatment:    Reason Eval/Treat Not Completed: Other (comment).  Fatiqued this pm.  Will check back tomorrow.  Maclovia Uher 07/24/2013, 4:55 PM Lesle Chris, OTR/L [phone] 07/24/2013

## 2013-07-24 NOTE — Progress Notes (Signed)
Report was given to nurse on 3rd floor. Patient in no acute distress, no pain, and vital signs stable.

## 2013-07-24 NOTE — Progress Notes (Signed)
Department of Radiation Oncology  Phone:  [phone] Fax:        [phone]  INPATIENT   Weekly Treatment Note    Name: Fred [name] Date: 07/24/2013 MRN: [id number] DOB: 06-22-1970   Current dose: 35 Gy  Current fraction: 14   MEDICATIONS: No current facility-administered medications for this encounter.   No current outpatient prescriptions on file.   Facility-Administered Medications Ordered in Other Encounters  Medication Dose Route Frequency Provider Last Rate Last Dose  . acetaminophen (TYLENOL) tablet 650 mg  650 mg Oral Q6H PRN Eugenie Filler, MD       Or  . acetaminophen (TYLENOL) suppository 650 mg  650 mg Rectal Q6H PRN Eugenie Filler, MD      . antiseptic oral rinse (BIOTENE) solution 15 mL  15 mL Mouth Rinse BID Irine Seal V, MD      . docusate sodium (COLACE) capsule 100 mg  100 mg Oral BID Eugenie Filler, MD   100 mg at 07/24/13 0925  . enoxaparin (LOVENOX) injection 40 mg  40 mg Subcutaneous Q24H Eugenie Filler, MD   40 mg at 07/23/13 1857  . feeding supplement (ENSURE COMPLETE) (ENSURE COMPLETE) liquid 237 mL  237 mL Oral TID BM Toribio Harbour, RD   237 mL at 07/23/13 2123  . gabapentin (NEURONTIN) capsule 100 mg  100 mg Oral TID Eugenie Filler, MD   100 mg at 07/24/13 0925  . ipratropium (ATROVENT) nebulizer solution 0.5 mg  0.5 mg Nebulization Q2H PRN Eugenie Filler, MD      . levalbuterol Penne Lash) nebulizer solution 0.63 mg  0.63 mg Nebulization Q2H PRN Eugenie Filler, MD      . lidocaine-prilocaine (EMLA) cream 1 application  1 application Topical PRN Irine Seal V, MD      . metoprolol tartrate (LOPRESSOR) tablet 50 mg  50 mg Oral BID Eugenie Filler, MD   50 mg at 07/24/13 0925  . morphine (MS CONTIN) 12 hr tablet 30 mg  30 mg Oral Q12H Eugenie Filler, MD   30 mg at 07/24/13 0925  . ondansetron (ZOFRAN) tablet 4 mg  4 mg Oral Q6H PRN Eugenie Filler, MD       Or  . ondansetron [facility]) injection 4 mg  4 mg  Intravenous Q6H PRN Irine Seal V, MD      . oxyCODONE (Oxy IR/ROXICODONE) immediate release tablet 5-10 mg  5-10 mg Oral Q6H PRN Eugenie Filler, MD   10 mg at 07/23/13 1112  . pantoprazole (PROTONIX) EC tablet 40 mg  40 mg Oral Daily Eugenie Filler, MD   40 mg at 07/24/13 6789  . polyethylene glycol (MIRALAX / GLYCOLAX) packet 17 g  17 g Oral Daily PRN Irine Seal V, MD      . sodium chloride 0.9 % injection 10 mL  10 mL Intravenous PRN Curt Bears, MD   10 mL at 04/29/13 1105  . sodium chloride 0.9 % injection 3 mL  3 mL Intravenous Q12H Eugenie Filler, MD   3 mL at 07/23/13 2125  . sorbitol 70 % solution 30 mL  30 mL Oral Daily PRN Eugenie Filler, MD         ALLERGIES: Review of patient's allergies indicates no known allergies.   LABORATORY DATA:  Lab Results  Component Value Date   WBC 2.9* 07/24/2013   HGB 9.4* 07/24/2013   HCT 27.4* 07/24/2013   MCV 86.4 07/24/2013  PLT 201 07/24/2013   Lab Results  Component Value Date   NA 134* 07/24/2013   K 3.8 07/24/2013   CL 97 07/24/2013   CO2 27 07/24/2013   Lab Results  Component Value Date   ALT 26 07/22/2013   AST 74* 07/22/2013   ALKPHOS 150* 07/22/2013   BILITOT 0.7 07/22/2013     NARRATIVE: Fred [name] was seen today for weekly treatment management. The chart was checked and the patient's films were reviewed. The patient states he has done well with treatment. He has noticed that he has been able to be more solid food and is which is a significant improvement for him. He is very pleased with this and finished his final fraction today. No major difficulties.  PHYSICAL EXAMINATION: Alert, in no acute distress     ASSESSMENT: The patient did satisfactorily with treatment.   PLAN: The patient will follow-up in our clinic in 1 month.

## 2013-07-24 NOTE — Progress Notes (Signed)
Flutter valve has been given to pt. Pt knows and understands how to use.

## 2013-07-24 NOTE — Progress Notes (Signed)
OT Cancellation Note  Patient Details Name: HARALAMBOS YEATTS MRN: [id number] DOB: 01-May-1970   Cancelled Treatment:    Reason Eval/Treat Not Completed: Other (comment).  Pt just moved up from ICU/SDU.  Will attempt to see him this afternoon if schedule permits.    Thor Nannini 07/24/2013, 2:42 PM Lesle Chris, OTR/L [phone] 07/24/2013

## 2013-07-24 NOTE — Progress Notes (Signed)
PT Cancellation Note  Patient Details Name: Fred [name] MRN: [id number] DOB: [birth year]   Cancelled Treatment:    Reason Eval/Treat Not Completed: Fatigue/lethargy limiting ability to participate (pt just showered and back to bed, too fatigued at this time)   Dalante Minus,KATHrine E 07/24/2013, 3:56 PM Carmelia Bake, PT, DPT 07/24/2013 Pager: [phone]

## 2013-07-25 LAB — CBC
HEMATOCRIT: 27.7 % — AB (ref 39.0–52.0)
Hemoglobin: 9.6 g/dL — ABNORMAL LOW (ref 13.0–17.0)
MCH: 29.7 pg (ref 26.0–34.0)
MCHC: 34.7 g/dL (ref 30.0–36.0)
MCV: 85.8 fL (ref 78.0–100.0)
Platelets: 212 10*3/uL (ref 150–400)
RBC: 3.23 MIL/uL — ABNORMAL LOW (ref 4.22–5.81)
RDW: 17.7 % — ABNORMAL HIGH (ref 11.5–15.5)
WBC: 4.2 10*3/uL (ref 4.0–10.5)

## 2013-07-25 LAB — BASIC METABOLIC PANEL
Anion gap: 9 (ref 5–15)
Anion gap: 10 (ref 5–15)
BUN: 9 mg/dL (ref 6–23)
BUN: 9 mg/dL (ref 6–23)
CHLORIDE: 96 meq/L (ref 96–112)
CHLORIDE: 99 meq/L (ref 96–112)
CO2: 28 mEq/L (ref 19–32)
CO2: 26 meq/L (ref 19–32)
CREATININE: 0.52 mg/dL (ref 0.50–1.35)
Calcium: 8.2 mg/dL — ABNORMAL LOW (ref 8.4–10.5)
Calcium: 7.6 mg/dL — ABNORMAL LOW (ref 8.4–10.5)
Creatinine, Ser: 0.55 mg/dL (ref 0.50–1.35)
GFR calc non Af Amer: >90 mL/min (ref >90)
GFR calc non Af Amer: >90 mL/min (ref >90)
GLUCOSE: 99 mg/dL (ref 70–99)
Glucose, Bld: 75 mg/dL (ref 70–99)
Potassium: 4 mEq/L (ref 3.7–5.3)
Potassium: 4.1 mEq/L (ref 3.7–5.3)
Sodium: 133 mEq/L — ABNORMAL LOW (ref 137–147)
Sodium: 135 mEq/L — ABNORMAL LOW (ref 137–147)

## 2013-07-25 LAB — GLUCOSE, CAPILLARY: Glucose-Capillary: 78 mg/dL (ref 70–99)

## 2013-07-25 MED ORDER — PROMETHAZINE HCL 25 MG/ML IJ SOLN: 25.0000 mg | INTRAMUSCULAR | Status: DC | PRN

## 2013-07-25 NOTE — Discharge Summary (Signed)
Physician Discharge Summary  Fred [name] EXH:[id number] DOB: 08-May-1970 DOA: 07/22/2013  PCP: [name], MD  Admit date: 07/22/2013 Discharge date: 07/25/2013  Recommendations for Outpatient Follow-up:  1. Pt will need to follow up with PCP in 2-3 weeks post discharge 2. Please obtain BMP to evaluate electrolytes and kidney function 3. Please also check CBC to evaluate Hg and Hct levels 4. 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 Vertell Limber, status post sterotactic radiation to T9 lesion per Dr. [name], systemic chemotherapy with EOX, systemic chemotherapy with cisplatin and irinotecan currently on systemic tx with docetaxel and ramucirumab who was recently hospitalized 7/9 to 07/14/2013 for malignant pleural effusion. During this hospitalization patient had 2 thoracenteses procedures done on the right side with a total of 3.4 L removed. This time, patient presented to radiation oncologist office for assessment of shortness of breath, dry nonproductive cough, chest tightness. Patient was noted to be tachycardic with heart rates in the 130s and a blood pressure 130/84.   Assessment and Plan:  Recurrent malignant pleural effusion  Status post Right thoracentesis with 1200  mm of port wine appearing pleural fluid per critical care today.  Pt reports more nausea this AM and still not able to tolerate adequate PO intake  patient has been scheduled for outpatient Pleurx catheter on Friday, 07/26/2013.  Symptomatic anemia  Likely secondary to chemotherapy and radiation.  Patient with no overt bleeding. Hemoglobin 7.9 --> 9.4 after 2 U PRBC transfusion July 21st, 2015  Hg remains stable  Metastatic esophageal adenocarcinoma with negative HER-2 diagnosed in January of 2015  Per oncology.  Functional quadriplegia  PT/OT if pt able to tolerate  Sinus tachycardia  Likely secondary to problems #1 and 2.  TSH within normal limits and 1.020.  Improved with increased dose beta blocker. Protein calorie malnutrition .  Advance diet as pt able to tolerate  Hypertension  Continue beta blocker.   Code Status: Full  Family Communication: Updated patient at bedside.   Consultants:  Pulmonary and critical care medicine: Dr. Halford Chessman 07/22/2013  Cardiothoracic surgery: Dr. Roxan Hockey 07/22/2013 Procedures:  Thoracentesis 07/23/2013 per Marni Griffon, NP Antibiotics:  None  Discharge Exam: Filed Vitals:   07/25/13 1405  BP: 111/76  Pulse: 102  Temp: 98.4 F (36.9 C)  Resp: 20   Filed Vitals:   07/24/13 2135 07/25/13 0519 07/25/13 0939 07/25/13 1405  BP: 121/75 118/71 119/71 111/76  Pulse: 129 105 110 102  Temp: 98.9 F (37.2 C) 98.3 F (36.8 C)  98.4 F (36.9 C)  TempSrc: Oral Oral  Oral  Resp: $Remo'20 19  20  'htdCr$ Height:      Weight:  98.939 kg (218 lb 1.9 oz)    SpO2: 98% 99%  99%    Please see earlier progress note physical  exam.   Discharge Instructions  Discharge Instructions   Diet - low sodium heart healthy    Complete by:  As directed      Increase activity slowly    Complete by:  As directed             Medication List         gabapentin 100 MG capsule  Commonly known as:  NEURONTIN  Take 1 capsule (100 mg total) by mouth 3 (three) times  daily.     lidocaine-prilocaine cream  Commonly known as:  EMLA  Apply 1 application topically as needed (for port access (skin numbing)). Apply to port 1 hr before chemo     metoprolol tartrate 25 MG tablet  Commonly known as:  LOPRESSOR  Take 1 tablet (25 mg total) by mouth 2 (two) times daily.     morphine 30 MG 12 hr tablet  Commonly known as:  MS CONTIN  Take 30 mg by mouth every 12 (twelve) hours.     oxyCODONE 5 MG immediate release tablet  Commonly known as:  Oxy IR/ROXICODONE  Take 5-10 mg by mouth every 6 (six) hours as needed for severe pain.           Follow-up Information   Schedule an appointment as soon as possible for a visit with [name], MD.   Specialty:  Family Medicine   Contact information:   [address] [place] [address] [phone]       Call Faye Ramsay, MD. (As needed, If symptoms worsen, call my cell phone [phone])    Specialty:  Internal Medicine   Contact information:   [address] [name] [address] [phone]        The results of significant diagnostics from this hospitalization (including imaging, microbiology, ancillary and laboratory) are listed below for reference.     Microbiology: Recent Results (from the past 240 hour(s))  MRSA PCR SCREENING     Status: None   Collection Time    07/22/13 12:05 PM      Result Value Ref Range Status   MRSA by PCR NEGATIVE  NEGATIVE Final   Comment:            The GeneXpert MRSA Assay (FDA     approved for NASAL specimens     only), is one component of a     comprehensive MRSA colonization     surveillance program. It is not     intended to diagnose MRSA     infection nor to guide or     monitor treatment for     MRSA infections.  URINE CULTURE     Status: None   Collection Time    07/23/13 12:37 AM      Result Value Ref Range Status   Specimen Description URINE, RANDOM   Final   Special Requests NONE   Final   Culture   Setup Time     Final   Value: 07/23/2013 04:11     Performed at [place]     Final   Value: NO GROWTH     Performed at [facility]   Culture     Final   Value: NO GROWTH     Performed at [facility]   Report Status 07/24/2013 FINAL   Final     Labs: Basic Metabolic Panel:  Recent Labs Lab 07/22/13 1159 07/23/13 0415 07/24/13 0352 07/25/13  0630 07/25/13 1300  NA 133* 134* 134* 133* 135*  K 4.1 3.7 3.8 4.0 4.1  CL 96 97 97 96 99  CO2 $Re'26 27 27 28 26  'kVX$ GLUCOSE 93 92 81 75 99  BUN $Re'8 8 9 9 9  'qdp$ CREATININE 0.63 0.58 0.56 0.55 0.52  CALCIUM 8.1* 7.9* 7.9* 8.2* 7.6*  MG 1.4*  --   --   --   --    Liver Function Tests:  Recent Labs Lab 07/22/13 1159  AST 74*  ALT 26  ALKPHOS 150*  BILITOT 0.7  PROT 5.7*  ALBUMIN 2.2*   No results found for this basename: LIPASE, AMYLASE,  in the last 168 hours No results found for this basename: AMMONIA,  in the last 168 hours CBC:  Recent Labs Lab 07/22/13 1159 07/23/13 0415 07/24/13 0352 07/25/13 0630  WBC 9.0 5.3 2.9* 4.2  NEUTROABS 7.9*  --   --   --   HGB 8.7* 7.9* 9.4* 9.6*  HCT 25.6* 24.2* 27.4* 27.7*  MCV 87.7 89.0 86.4 85.8  PLT 343 278 201 212   Cardiac Enzymes:  Recent Labs Lab 07/22/13 1159 07/22/13 1820 07/22/13 2242  TROPONINI <0.30 <0.30 <0.30   BNP: BNP (last 3 results)  Recent Labs  07/11/13 1830 07/22/13 1159  PROBNP 111.5 174.1*   CBG:  Recent Labs Lab 07/23/13 0746 07/24/13 0802 07/25/13 0747  GLUCAP 111* 80 78     SIGNED: Time coordinating discharge: Over 30 minutes  Faye Ramsay, MD  [facility] 07/25/2013, 5:15 PM Pager [phone]  If 7PM-7AM, please contact night-coverage [email or web address] Password [email or web address]

## 2013-07-25 NOTE — Progress Notes (Signed)
Jenny Reichmann called from [facility] and stated that patient was going to be discharged tonight. Informed her to tell patient NPO past midnight, arrive at 07:15, take gabapentin, metoprolol, pain medications with just a sip of water. States she will tell patient same

## 2013-07-25 NOTE — Progress Notes (Signed)
PT Cancellation Note  Patient Details Name: Fred [name] MRN: [id number] DOB: 03/30/1970   Cancelled Treatment:    Reason Eval/Treat Not Completed: PT screened, no needs identified, will sign off. Spoke with pt/family-denies need for PT services. Pt reports he has been ambulating.  Will sign off at this time. Made pt aware to notify MD if needs change.     Weston Anna, MPT Pager: [phone]

## 2013-07-25 NOTE — Progress Notes (Signed)
Patient ID: Fred [name], male   DOB: 01/24/1970, 43 y.o.   MRN: [id number]  [facility] PROGRESS NOTE  PERRIN GENS FUX:[id number] DOB: 1970-05-09 DOA: 07/22/2013 PCP: [name], MD  Brief narrative:  43 y.o. 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 Vertell Limber, status post sterotactic radiation to T9 lesion per Dr. [name], systemic chemotherapy with EOX, systemic chemotherapy with cisplatin and irinotecan currently on systemic tx with docetaxel and ramucirumab who was recently hospitalized 7/9 to 07/14/2013 for malignant pleural effusion. During this hospitalization patient had 2 thoracenteses procedures done on the right side with a total of 3.4 L removed. This time, patient presented to radiation oncologist office for assessment of shortness of breath, dry nonproductive cough, chest tightness. Patient was noted to be tachycardic with heart rates in the 130s and a blood pressure 130/84.   Assessment and Plan:  Recurrent malignant pleural effusion  Status post Right thoracentesis with 1200 mm of port wine appearing pleural fluid per critical care today.  Pt reports more nausea this AM and still not able to tolerate adequate PO intake  patient has been scheduled for outpatient Pleurx catheter on Friday, 07/26/2013.  Symptomatic anemia  Likely secondary to chemotherapy and radiation.  Patient with no overt bleeding. Hemoglobin 7.9 --> 9.4 after 2 U PRBC transfusion July 21st, 2015  Hg remains stable at 9.6  Repeat CBC in AM Metastatic esophageal adenocarcinoma with negative HER-2 diagnosed in January of 2015  Per oncology.  Functional quadriplegia  PT/OT if pt able to tolerate  Sinus tachycardia  Likely secondary to problems #1 and 2.  TSH within normal limits and 1.020.  Improved with increased dose beta blocker. Protein calorie malnutrition .  Advance diet as pt able to tolerate   Hypertension  Continue beta blocker.  Prophylaxis  PPI for GI prophylaxis. Lovenox for DVT prophylaxis.   Code Status: Full  Family Communication: Updated patient at bedside.  Disposition Plan: Remains inpatient   Consultants:  Pulmonary and critical care medicine: Dr. Halford Chessman 07/22/2013  Cardiothoracic surgery: Dr. Roxan Hockey 07/22/2013 Procedures:  Thoracentesis 07/23/2013 per Marni Griffon, NP Antibiotics:  None  HPI/Subjective: No events overnight.   Objective: Filed Vitals:   07/24/13 2020 07/24/13 2135 07/25/13 0519 07/25/13 0939  BP:  121/75 118/71 119/71  Pulse: 118 129 105 110  Temp:  98.9 F (37.2 C) 98.3 F (36.8 C)   TempSrc:  Oral Oral   Resp:  20 19   Height:      Weight:   98.939 kg (218 lb 1.9 oz)   SpO2:  98% 99%     Intake/Output Summary (Last 24 hours) at 07/25/13 1214 Last data filed at 07/25/13 0930  Gross per 24 hour  Intake    670 ml  Output    327 ml  Net    343 ml    Exam:   General:  Pt is alert, follows commands appropriately, not in acute distress  Cardiovascular: Regular rhythm, tachycardic, S1/S2, no murmurs, no rubs, no gallops  Respiratory: Diminished breath sounds bilaterally with rales at bases R > L side   Abdomen: Soft, non tender, non distended, bowel sounds present, no guarding  Extremities: pulses DP and PT palpable bilaterally  Neuro: Grossly nonfocal  Data Reviewed: Basic Metabolic Panel:  Recent Labs Lab 07/22/13 1159 07/23/13 0415 07/24/13 0352 07/25/13 0630  NA 133* 134* 134* 133*  K 4.1 3.7 3.8 4.0  CL 96  97 97 96  CO2 $Re'26 27 27 28  'gxW$ GLUCOSE 93 92 81 75  BUN $Re'8 8 9 9  'Dys$ CREATININE 0.63 0.58 0.56 0.55  CALCIUM 8.1* 7.9* 7.9* 8.2*  MG 1.4*  --   --   --    Liver Function Tests:  Recent Labs Lab 07/22/13 1159  AST 74*  ALT 26  ALKPHOS 150*  BILITOT 0.7  PROT 5.7*  ALBUMIN 2.2*   CBC:  Recent Labs Lab 07/22/13 1159 07/23/13 0415 07/24/13 0352 07/25/13 0630  WBC 9.0 5.3 2.9* 4.2   NEUTROABS 7.9*  --   --   --   HGB 8.7* 7.9* 9.4* 9.6*  HCT 25.6* 24.2* 27.4* 27.7*  MCV 87.7 89.0 86.4 85.8  PLT 343 278 201 212   Cardiac Enzymes:  Recent Labs Lab 07/22/13 1159 07/22/13 1820 07/22/13 2242  TROPONINI <0.30 <0.30 <0.30   CBG:  Recent Labs Lab 07/23/13 0746 07/24/13 0802 07/25/13 0747  GLUCAP 111* 80 78    Recent Results (from the past 240 hour(s))  MRSA PCR SCREENING     Status: None   Collection Time    07/22/13 12:05 PM      Result Value Ref Range Status   MRSA by PCR NEGATIVE  NEGATIVE Final   Comment:            The GeneXpert MRSA Assay (FDA     approved for NASAL specimens     only), is one component of a     comprehensive MRSA colonization     surveillance program. It is not     intended to diagnose MRSA     infection nor to guide or     monitor treatment for     MRSA infections.  URINE CULTURE     Status: None   Collection Time    07/23/13 12:37 AM      Result Value Ref Range Status   Specimen Description URINE, RANDOM   Final   Special Requests NONE   Final   Culture  Setup Time     Final   Value: 07/23/2013 04:11     Performed at [facility]     Final   Value: NO GROWTH     Performed at [facility]   Culture     Final   Value: NO GROWTH     Performed at [facility]   Report Status 07/24/2013 FINAL   Final     Scheduled Meds: . antiseptic oral rinse  15 mL Mouth Rinse BID  . docusate sodium  100 mg Oral BID  . enoxaparin (LOVENOX) injection  40 mg Subcutaneous Q24H  . feeding supplement (ENSURE COMPLETE)  237 mL Oral TID BM  . gabapentin  100 mg Oral TID  . metoprolol tartrate  50 mg Oral BID  . morphine  30 mg Oral Q12H  . pantoprazole sodium  40 mg Oral BID  . sodium chloride  3 mL Intravenous Q12H  . sucralfate  1 g Oral TID WC & HS   Continuous Infusions:  Faye Ramsay, MD  [facility] Pager [phone]  If 7PM-7AM, please contact night-coverage [email or web address] Password  [email or web address] 07/25/2013, 12:14 PM   LOS: 3 days

## 2013-07-25 NOTE — Discharge Instructions (Signed)
Pleural Effusion The lining covering your lungs and the inside of your chest is called the pleura. Usually, the space between the two pleura contains no air and only a thin layer of fluid. A pleural effusion is an abnormal buildup of fluid in the pleural space. Fluid gathers when there is increased pressure in the lung vessels. This forces fluids out of the lungs and into the pleural space. Vessels may also leak fluids when there are infections, such as pneumonia, or other causes of soreness and redness (inflammation). Fluids leak into the lungs when protein in the blood is low or when certain vessels (lymphatics) are blocked. Finding a pleural effusion is important because it is usually caused by another disease. In order to treat a pleural effusion, your health care provider needs to find its cause. If left untreated, a large amount of fluid can build up and cause collapse of the lung. CAUSES   Heart failure.  Infections (pneumonia, tuberculosis), pulmonary embolism, pulmonary infarction.  Cancer (primary lung and metastatic), asbestosis.  Liver failure (cirrhosis).  Nephrotic syndrome, peritoneal dialysis, kidney problems (uremia).  Collagen vascular disease (systemic lupus erythematosus, rheumatoid arthritis).  Injury (trauma) to the chest or rupture of the digestive tube (esophagus).  Material in the chest or pleural space (hemothorax, chylothorax).  Pancreatitis.  Surgery.  Drug reactions. SYMPTOMS  A pleural effusion can decrease the amount of space available for breathing and make you short of breath. The fluid can become infected, which may cause pain and fever. Often, the pain is worse when taking a deep breath. The underlying disease (heart failure, pneumonia, blood clot, tuberculosis, cancer) may also cause symptoms. DIAGNOSIS   Your health care provider can usually tell what is wrong by talking to you (taking a history), doing an exam, and taking a routine X-ray. If the  X-ray shows fluid in your chest, often fluid is removed from your chest with a needle for testing (diagnostic thoracentesis).  Sometimes, more specialized X-rays may be needed.  Sometimes, a small piece of tissue is removed and examined by a specialist (biopsy). TREATMENT  Treatment varies based on what caused the pleural effusion. Treatments include:  Removing as much fluid as possible using a needle (thoracentesis) to improve the cough and shortness of breath. This is a simple procedure that can be done at bedside. The risks are bleeding, infection, collapse of a lung, or low blood pressure.  Placing a tube in the chest to drain the effusion (tube thoracostomy). This is often used when there is an infection in the fluid. This is a simple procedure that can often be done at bedside or in a clinic. The procedure may be painful. The risks are the same as using a needle to drain the fluid. The chest tube usually remains for a few days and is connected to suction to improve fluid drainage. After placement, the tube usually does not cause much discomfort.  Surgical removal of fibrous debris in and around the pleural space (decortication). This may be done with a flexible telescope (thoracoscope) through a small or large cut (incision). This is helpful for patients who have fibrosis or scar tissue that prevents complete lung expansion. The risks are infection, blood loss, and side effects from general anesthesia.  Sometimes, a procedure called pleurodesis is done. A chest tube is placed and the fluid is drained. Next, an agent (tetracycline, talc powder) is added to the pleural space. This causes the lung and chest wall to stick together (adhesion). This leaves no  potential space for fluid to build up. The risks include infection, blood loss, and side effects from general anesthesia.  If the effusion is caused by infection, it may be treated with antibiotics and may improve without draining. HOME CARE  INSTRUCTIONS   Take any medicines exactly as prescribed.  Follow up with your health care provider as directed.  Monitor your exercise capacity (the amount of walking you can do before you get short of breath).  Do not use any tobacco products including cigarettes, chewing tobacco, or electronic cigarettes. SEEK MEDICAL CARE IF:   Your exercise capacity seems to get worse or does not improve with time.  You do not recover from your illness.  You have drainage, redness, swelling, or pain at any incision or puncture sites. SEEK IMMEDIATE MEDICAL CARE IF:   Shortness of breath or chest pain develops or gets worse.  You have a fever.  You develop a new cough, especially if the mucus (phlegm) is discolored. MAKE SURE YOU:   Understand these instructions.  Will watch your condition.  Will get help right away if you are not doing well or get worse. Document Released: 12/20/2004 Document Revised: 05/06/2013 Document Reviewed: 08/11/2006 [facility] Patient Information 2015 [place], [place]. This information is not intended to replace advice given to you by your health care provider. Make sure you discuss any questions you have with your health care provider.

## 2013-07-25 NOTE — Plan of Care (Signed)
Problem: Phase I Progression Outcomes Goal: OOB as tolerated unless otherwise ordered Outcome: Progressing Patient uses a cane to ambulate.

## 2013-07-25 NOTE — Plan of Care (Signed)
Problem: Phase I Progression Outcomes Goal: Other Phase I Outcomes/Goals Outcome: Progressing Had MD on call change protonix and morphine prn to liquid dosing. Patient with difficulty swallowing. C/o burning in chest/throat and indigestion.

## 2013-07-25 NOTE — Progress Notes (Signed)
OT Cancellation Note  Patient Details Name: Fred [name] MRN: [id number] DOB: 08-12-1970   Cancelled Treatment:    Reason Eval/Treat Not Completed: Other (comment);OT screened, no needs identified, will sign off      Honorio Devol 07/25/2013, 3:18 PM Lesle Chris, OTR/L [phone] 07/25/2013

## 2013-07-25 NOTE — Progress Notes (Signed)
Patient discharged to home via wheelchair with family, discharge instructions reviewed with patient and wife. No new RX's given to patient. Instructions for his surgery tomorrow given to patient.

## 2013-07-25 NOTE — Progress Notes (Signed)
Discharge order placed. Pt can be discharged.  Faye Ramsay, MD  [facility] Pager [phone]  If 7PM-7AM, please contact night-coverage [email or web address] Password [email or web address]

## 2013-07-26 ENCOUNTER — Other Ambulatory Visit: Payer: BC Managed Care – PPO

## 2013-07-26 ENCOUNTER — Encounter (HOSPITAL_COMMUNITY)
Admission: RE | Disposition: A | Discharge status: Home / Self Care | Payer: Self-pay | Source: Ambulatory Visit | Attending: Thoracic Surgery (Cardiothoracic Vascular Surgery)

## 2013-07-26 ENCOUNTER — Ambulatory Visit: Payer: BC Managed Care – PPO | Admitting: Physician Assistant

## 2013-07-26 ENCOUNTER — Ambulatory Visit (HOSPITAL_COMMUNITY)
Admission: RE | Admit: 2013-07-26 | Discharge: 2013-07-26 | Disposition: A | Discharge status: Home / Self Care | Payer: BC Managed Care – PPO | Source: Ambulatory Visit | Attending: Thoracic Surgery (Cardiothoracic Vascular Surgery) | Admitting: Thoracic Surgery (Cardiothoracic Vascular Surgery)

## 2013-07-26 ENCOUNTER — Ambulatory Visit (HOSPITAL_COMMUNITY)
Admission: RE | Admit: 2013-07-26 | Payer: BC Managed Care – PPO | Source: Ambulatory Visit | Admitting: Thoracic Surgery (Cardiothoracic Vascular Surgery)

## 2013-07-26 ENCOUNTER — Ambulatory Visit (HOSPITAL_COMMUNITY): Payer: BC Managed Care – PPO

## 2013-07-26 ENCOUNTER — Ambulatory Visit (HOSPITAL_COMMUNITY): Payer: BC Managed Care – PPO | Admitting: Certified Registered"

## 2013-07-26 ENCOUNTER — Ambulatory Visit (HOSPITAL_COMMUNITY): Admission: RE | Admit: 2013-07-26 | Payer: BC Managed Care – PPO | Source: Ambulatory Visit

## 2013-07-26 ENCOUNTER — Encounter (HOSPITAL_COMMUNITY): Payer: Self-pay | Admitting: *Deleted

## 2013-07-26 ENCOUNTER — Encounter (HOSPITAL_COMMUNITY): Payer: BC Managed Care – PPO | Admitting: Certified Registered"

## 2013-07-26 DIAGNOSIS — Z9221 Personal history of antineoplastic chemotherapy: Secondary | ICD-10-CM | POA: Insufficient documentation

## 2013-07-26 DIAGNOSIS — C159 Malignant neoplasm of esophagus, unspecified: Secondary | ICD-10-CM | POA: Insufficient documentation

## 2013-07-26 DIAGNOSIS — C155 Malignant neoplasm of lower third of esophagus: Secondary | ICD-10-CM

## 2013-07-26 DIAGNOSIS — Z79899 Other long term (current) drug therapy: Secondary | ICD-10-CM | POA: Insufficient documentation

## 2013-07-26 DIAGNOSIS — C7951 Secondary malignant neoplasm of bone: Secondary | ICD-10-CM | POA: Insufficient documentation

## 2013-07-26 DIAGNOSIS — I10 Essential (primary) hypertension: Secondary | ICD-10-CM | POA: Insufficient documentation

## 2013-07-26 DIAGNOSIS — J910 Malignant pleural effusion: Secondary | ICD-10-CM | POA: Insufficient documentation

## 2013-07-26 DIAGNOSIS — Z923 Personal history of irradiation: Secondary | ICD-10-CM | POA: Insufficient documentation

## 2013-07-26 DIAGNOSIS — C7952 Secondary malignant neoplasm of bone marrow: Secondary | ICD-10-CM

## 2013-07-26 HISTORY — PX: CHEST TUBE INSERTION: SHX231

## 2013-07-26 LAB — COMPREHENSIVE METABOLIC PANEL
ALBUMIN: 2.4 g/dL — AB (ref 3.5–5.2)
ALT: 25 U/L (ref 0–53)
AST: 67 U/L — AB (ref 0–37)
Alkaline Phosphatase: 191 U/L — ABNORMAL HIGH (ref 39–117)
Anion gap: 17 — ABNORMAL HIGH (ref 5–15)
BUN: 9 mg/dL (ref 6–23)
CALCIUM: 8.2 mg/dL — AB (ref 8.4–10.5)
CO2: 24 mEq/L (ref 19–32)
CREATININE: 0.61 mg/dL (ref 0.50–1.35)
Chloride: 96 mEq/L (ref 96–112)
GFR calc Af Amer: >90 mL/min (ref >90)
GFR calc non Af Amer: >90 mL/min (ref >90)
Glucose, Bld: 92 mg/dL (ref 70–99)
Potassium: 4.1 mEq/L (ref 3.7–5.3)
Sodium: 137 mEq/L (ref 137–147)
Total Bilirubin: 0.6 mg/dL (ref 0.3–1.2)
Total Protein: 5.8 g/dL — ABNORMAL LOW (ref 6.0–8.3)

## 2013-07-26 LAB — CBC
HCT: 32.4 % — ABNORMAL LOW (ref 39.0–52.0)
HEMOGLOBIN: 10.8 g/dL — AB (ref 13.0–17.0)
MCH: 28.9 pg (ref 26.0–34.0)
MCHC: 33.3 g/dL (ref 30.0–36.0)
MCV: 86.6 fL (ref 78.0–100.0)
Platelets: 227 10*3/uL (ref 150–400)
RBC: 3.74 MIL/uL — AB (ref 4.22–5.81)
RDW: 17.8 % — ABNORMAL HIGH (ref 11.5–15.5)
WBC: 11.8 10*3/uL — ABNORMAL HIGH (ref 4.0–10.5)

## 2013-07-26 LAB — APTT: aPTT: 33 seconds (ref 24–37)

## 2013-07-26 LAB — PROTIME-INR
INR: 1.09 (ref 0.00–1.49)
PROTHROMBIN TIME: 14.1 s (ref 11.6–15.2)

## 2013-07-26 SURGERY — INSERTION, PLEURAL DRAINAGE CATHETER
Anesthesia: Monitor Anesthesia Care | Laterality: Right

## 2013-07-26 MED ORDER — SODIUM CHLORIDE 0.9 % IJ SOLN: 10.0000 mL | Freq: Two times a day (BID) | INTRAMUSCULAR | Status: DC

## 2013-07-26 MED ORDER — LIDOCAINE HCL (PF) 1 % IJ SOLN
INTRAMUSCULAR | Status: DC | PRN
  Administered 2013-07-26: 5 mL

## 2013-07-26 MED ORDER — HEPARIN SOD (PORK) LOCK FLUSH 100 UNIT/ML IV SOLN
500.0000 [IU] | INTRAVENOUS | Status: DC | PRN
  Administered 2013-07-26: 500 [IU]
  Filled 2013-07-26: qty 5

## 2013-07-26 MED ORDER — PROPOFOL 10 MG/ML IV BOLUS
INTRAVENOUS | Status: AC
  Filled 2013-07-26: qty 20

## 2013-07-26 MED ORDER — LIDOCAINE 1 % OPTIME INJ - NO CHARGE
INTRAMUSCULAR | Status: DC | PRN
  Administered 2013-07-26: 15 mL

## 2013-07-26 MED ORDER — HEPARIN SOD (PORK) LOCK FLUSH 100 UNIT/ML IV SOLN
500.0000 [IU] | INTRAVENOUS | Status: DC
  Filled 2013-07-26: qty 5

## 2013-07-26 MED ORDER — LIDOCAINE HCL (CARDIAC) 20 MG/ML IV SOLN
INTRAVENOUS | Status: DC | PRN
  Administered 2013-07-26: 60 mg via INTRAVENOUS

## 2013-07-26 MED ORDER — PROPOFOL 10 MG/ML IV BOLUS
INTRAVENOUS | Status: DC | PRN
  Administered 2013-07-26: 20 mg via INTRAVENOUS
  Administered 2013-07-26: 30 mg via INTRAVENOUS
  Administered 2013-07-26: 20 mg via INTRAVENOUS
  Administered 2013-07-26: 30 mg via INTRAVENOUS
  Administered 2013-07-26: 20 mg via INTRAVENOUS
  Administered 2013-07-26: 60 mg via INTRAVENOUS
  Administered 2013-07-26: 20 mg via INTRAVENOUS

## 2013-07-26 MED ORDER — MIDAZOLAM HCL 2 MG/2ML IJ SOLN
INTRAMUSCULAR | Status: AC
  Filled 2013-07-26: qty 2

## 2013-07-26 MED ORDER — MIDAZOLAM HCL 5 MG/5ML IJ SOLN
INTRAMUSCULAR | Status: DC | PRN
  Administered 2013-07-26: 2 mg via INTRAVENOUS

## 2013-07-26 MED ORDER — LACTATED RINGERS IV SOLN
INTRAVENOUS | Status: DC
  Administered 2013-07-26: 08:00:00 via INTRAVENOUS

## 2013-07-26 MED ORDER — FENTANYL CITRATE 0.05 MG/ML IJ SOLN
INTRAMUSCULAR | Status: AC
  Administered 2013-07-26: 50 ug via INTRAVENOUS
  Filled 2013-07-26: qty 2

## 2013-07-26 MED ORDER — METOPROLOL TARTRATE 12.5 MG HALF TABLET
ORAL_TABLET | ORAL | Status: AC
  Administered 2013-07-26: 25 mg via ORAL
  Filled 2013-07-26: qty 2

## 2013-07-26 MED ORDER — METOPROLOL TARTRATE 25 MG PO TABS
25.0000 mg | ORAL_TABLET | Freq: Once | ORAL | Status: AC
  Administered 2013-07-26: 25 mg via ORAL

## 2013-07-26 MED ORDER — 0.9 % SODIUM CHLORIDE (POUR BTL) OPTIME
TOPICAL | Status: DC | PRN
  Administered 2013-07-26: 1000 mL

## 2013-07-26 MED ORDER — FENTANYL CITRATE 0.05 MG/ML IJ SOLN
25.0000 ug | INTRAMUSCULAR | Status: DC | PRN
  Administered 2013-07-26: 50 ug via INTRAVENOUS

## 2013-07-26 MED ORDER — FENTANYL CITRATE 0.05 MG/ML IJ SOLN
INTRAMUSCULAR | Status: AC
  Filled 2013-07-26: qty 5

## 2013-07-26 MED ORDER — CEFUROXIME SODIUM 1.5 G IJ SOLR
1.5000 g | INTRAMUSCULAR | Status: AC
  Administered 2013-07-26: 1.5 g via INTRAVENOUS
  Filled 2013-07-26: qty 1.5

## 2013-07-26 MED ORDER — ONDANSETRON HCL 4 MG/2ML IJ SOLN: 4.0000 mg | Freq: Four times a day (QID) | INTRAMUSCULAR | Status: DC | PRN

## 2013-07-26 MED ORDER — OXYCODONE HCL 5 MG PO TABS: 5.0000 mg | ORAL_TABLET | Freq: Once | ORAL | Status: DC | PRN

## 2013-07-26 MED ORDER — OXYCODONE HCL 5 MG/5ML PO SOLN: 5.0000 mg | Freq: Once | ORAL | Status: DC | PRN

## 2013-07-26 MED ORDER — PHENYLEPHRINE 40 MCG/ML (10ML) SYRINGE FOR IV PUSH (FOR BLOOD PRESSURE SUPPORT)
PREFILLED_SYRINGE | INTRAVENOUS | Status: AC
  Filled 2013-07-26: qty 10

## 2013-07-26 MED ORDER — SODIUM CHLORIDE 0.9 % IJ SOLN
10.0000 mL | INTRAMUSCULAR | Status: DC | PRN
  Administered 2013-07-26: 10 mL

## 2013-07-26 MED ORDER — PHENYLEPHRINE HCL 10 MG/ML IJ SOLN
INTRAMUSCULAR | Status: DC | PRN
  Administered 2013-07-26: 80 ug via INTRAVENOUS
  Administered 2013-07-26: 120 ug via INTRAVENOUS
  Administered 2013-07-26: 80 ug via INTRAVENOUS

## 2013-07-26 MED ORDER — LIDOCAINE HCL (PF) 1 % IJ SOLN
INTRAMUSCULAR | Status: AC
  Filled 2013-07-26: qty 30

## 2013-07-26 MED ORDER — FENTANYL CITRATE 0.05 MG/ML IJ SOLN
INTRAMUSCULAR | Status: DC | PRN
  Administered 2013-07-26: 50 ug via INTRAVENOUS
  Administered 2013-07-26: 100 ug via INTRAVENOUS
  Administered 2013-07-26 (×2): 50 ug via INTRAVENOUS

## 2013-07-26 MED ORDER — LIDOCAINE HCL (CARDIAC) 20 MG/ML IV SOLN
INTRAVENOUS | Status: AC
  Filled 2013-07-26: qty 5

## 2013-07-26 SURGICAL SUPPLY — 31 items
ADH SKN CLS APL DERMABOND .7 (GAUZE/BANDAGES/DRESSINGS)
CANISTER SUCTION 2500CC (MISCELLANEOUS) ×3 IMPLANT
COVER SURGICAL LIGHT HANDLE (MISCELLANEOUS) ×3 IMPLANT
DERMABOND ADVANCED (GAUZE/BANDAGES/DRESSINGS)
DERMABOND ADVANCED .7 DNX12 (GAUZE/BANDAGES/DRESSINGS) ×1 IMPLANT
DRAPE C-ARM 42X72 X-RAY (DRAPES) ×3 IMPLANT
DRAPE LAPAROSCOPIC ABDOMINAL (DRAPES) ×3 IMPLANT
DRSG TEGADERM 4X4.75 (GAUZE/BANDAGES/DRESSINGS) ×2 IMPLANT
GLOVE BIOGEL PI IND STRL 6.5 (GLOVE) IMPLANT
GLOVE BIOGEL PI INDICATOR 6.5 (GLOVE) ×4
GLOVE ECLIPSE 6.5 STRL STRAW (GLOVE) ×2 IMPLANT
GLOVE SURG SIGNA 7.5 PF LTX (GLOVE) ×2 IMPLANT
GOWN STRL REUS W/ TWL LRG LVL3 (GOWN DISPOSABLE) ×1 IMPLANT
GOWN STRL REUS W/ TWL XL LVL3 (GOWN DISPOSABLE) ×1 IMPLANT
GOWN STRL REUS W/TWL LRG LVL3 (GOWN DISPOSABLE) ×3
GOWN STRL REUS W/TWL XL LVL3 (GOWN DISPOSABLE) ×3
KIT BASIN OR (CUSTOM PROCEDURE TRAY) ×3 IMPLANT
KIT PLEURX DRAIN CATH 1000ML (MISCELLANEOUS) ×5 IMPLANT
KIT PLEURX DRAIN CATH 15.5FR (DRAIN) ×3 IMPLANT
KIT ROOM TURNOVER OR (KITS) ×3 IMPLANT
LIQUIBAND EXCEED REF # LX6 BY COVIDIEN ×2 IMPLANT
NS IRRIG 1000ML POUR BTL (IV SOLUTION) ×3 IMPLANT
PACK GENERAL/GYN (CUSTOM PROCEDURE TRAY) ×3 IMPLANT
PAD ARMBOARD 7.5X6 YLW CONV (MISCELLANEOUS) ×6 IMPLANT
SET DRAINAGE LINE (MISCELLANEOUS) IMPLANT
SPONGE GAUZE 4X4 12PLY STER LF (GAUZE/BANDAGES/DRESSINGS) ×2 IMPLANT
SUT ETHILON 3 0 FSL (SUTURE) ×3 IMPLANT
SUT VIC AB 3-0 X1 27 (SUTURE) ×3 IMPLANT
TOWEL OR 17X24 6PK STRL BLUE (TOWEL DISPOSABLE) ×3 IMPLANT
TOWEL OR 17X26 10 PK STRL BLUE (TOWEL DISPOSABLE) ×3 IMPLANT
WATER STERILE IRR 1000ML POUR (IV SOLUTION) ×3 IMPLANT

## 2013-07-26 NOTE — Anesthesia Procedure Notes (Signed)
Procedure Name: MAC Date/Time: 07/26/2013 10:18 AM Performed by: Barrington Ellison Pre-anesthesia Checklist: Patient identified, Emergency Drugs available, Suction available and Patient being monitored Patient Re-evaluated:Patient Re-evaluated prior to inductionOxygen Delivery Method: Simple face mask Preoxygenation: Pre-oxygenation with 100% oxygen

## 2013-07-26 NOTE — Brief Op Note (Signed)
07/26/2013  11:46 AM  PATIENT:  Fred [name]  43 y.o. male  PRE-OPERATIVE DIAGNOSIS:  RECURRENT MALIGNANT RIGHT PLEURAL EFFUSION  POST-OPERATIVE DIAGNOSIS:  RECURRENT MALIGNANT RIGHT PLEURAL EFFUSION  PROCEDURE:  Procedure(s): INSERTION PLEURAL DRAINAGE CATHETER (Right)  SURGEON:  Surgeon(s) and Role:    * Melrose Nakayama, MD - Primary  PHYSICIAN ASSISTANT:   ASSISTANTS: none   ANESTHESIA:   local and IV sedation  EBL:  Total I/O In: 800 [I.V.:800] Out: -   BLOOD ADMINISTERED:none  DRAINS: Pleur-x catheter right pleural space   LOCAL MEDICATIONS USED:  LIDOCAINE   SPECIMEN:  Aspirate  DISPOSITION OF SPECIMEN:  N/A  PLAN OF CARE: Discharge to home after PACU  PATIENT DISPOSITION:  PACU - hemodynamically stable.   Delay start of Pharmacological VTE agent (>24hrs) due to surgical blood loss or risk of bleeding: not applicable  Findings- 1.5 L bloody fluid drained

## 2013-07-26 NOTE — Anesthesia Preprocedure Evaluation (Addendum)
Anesthesia Evaluation  Patient identified by MRN, date of birth, ID band Patient awake    Reviewed: Allergy & Precautions, H&P , NPO status , Patient's Chart, lab work & pertinent test results  Airway Mallampati: III TM Distance: >3 FB Neck ROM: full    Dental  (+) Teeth Intact, Dental Advisory Given   Pulmonary shortness of breath,          Cardiovascular hypertension, Rhythm:Regular     Neuro/Psych    GI/Hepatic GERD-  ,  Endo/Other  obese  Renal/GU      Musculoskeletal   Abdominal   Peds  Hematology   Anesthesia Other Findings   Reproductive/Obstetrics                          Anesthesia Physical Anesthesia Plan  ASA: II  Anesthesia Plan: MAC   Post-op Pain Management:    Induction: Intravenous  Airway Management Planned: Simple Face Mask  Additional Equipment:   Intra-op Plan:   Post-operative Plan:   Informed Consent: I have reviewed the patients History and Physical, chart, labs and discussed the procedure including the risks, benefits and alternatives for the proposed anesthesia with the patient or authorized representative who has indicated his/her understanding and acceptance.     Plan Discussed with: CRNA, Anesthesiologist and Surgeon  Anesthesia Plan Comments:         Anesthesia Quick Evaluation

## 2013-07-26 NOTE — Anesthesia Postprocedure Evaluation (Signed)
Anesthesia Post Note  Patient: Fred [name]  Procedure(s) Performed: Procedure(s) (LRB): INSERTION PLEURAL DRAINAGE CATHETER (Right)  Anesthesia type: MAC  Patient location: PACU  Post pain: Pain level controlled and Adequate analgesia  Post assessment: Post-op Vital signs reviewed, Patient's Cardiovascular Status Stable and Respiratory Function Stable  Last Vitals:  Filed Vitals:   07/26/13 1215  BP: 104/71  Pulse: 103  Temp:   Resp: 21    Post vital signs: Reviewed and stable  Level of consciousness: awake, alert  and oriented  Complications: No apparent anesthesia complications

## 2013-07-26 NOTE — Discharge Instructions (Addendum)
Leave dressing in place over the pleur-X catheter  You may use your existing pain medications as directed  Do not drive or engage in heavy physical activity for 24 hours, after that you may resume normal activities  You may shower tomorrow  Call [phone] for a follow up appointment in 2 weeks  Call [phone] if you experience severe chest pain, shortness of breath or fever > 101 F  What to eat:  For your first meals, you should eat lightly; only small meals initially.  If you do not have nausea, you may eat larger meals.  Avoid spicy, greasy and heavy food.   . General Anesthesia, Adult, Care After  Refer to this sheet in the next few weeks. These instructions provide you with information on caring for yourself after your procedure. Your health care provider may also give you more specific instructions. Your treatment has been planned according to current medical practices, but problems sometimes occur. Call your health care provider if you have any problems or questions after your procedure.  WHAT TO EXPECT AFTER THE PROCEDURE  After the procedure, it is typical to experience:  Sleepiness.  Nausea and vomiting. HOME CARE INSTRUCTIONS  For the first 24 hours after general anesthesia:  Have a responsible person with you.  Do not drive a car. If you are alone, do not take public transportation.  Do not drink alcohol.  Do not take medicine that has not been prescribed by your health care provider.  Do not sign important papers or make important decisions.  You may resume a normal diet and activities as directed by your health care provider.  Change bandages (dressings) as directed.  If you have questions or problems that seem related to general anesthesia, call the hospital and ask for the anesthetist or anesthesiologist on call. SEEK MEDICAL CARE IF:  You have nausea and vomiting that continue the day after anesthesia.  You develop a rash. SEEK IMMEDIATE MEDICAL CARE IF:  You have  difficulty breathing.  You have chest pain.  You have any allergic problems. Document Released: 03/28/2000 Document Revised: 08/22/2012 Document Reviewed: 07/05/2012  [facility] Patient Information 2014 [place], [place].

## 2013-07-26 NOTE — H&P (Signed)
Reason for Consult:Recurrent right pleural effusion  Referring Physician: Marni Griffon, NP  Fred [name] is an 43 y.o. male.  HPI: 43 yo man with stage IV esophageal cancer who presented with a cc/o shortness of breath.  Fred [name] is a 43 yo male with a history of tobacco abuse who has stage IV esophageal cancer. He is currently undergoing chemotherapy and radiation. He will complete radiation this week. He developed bilateral pleural effusions- right > left. Thoracentesis of the right was performed 3 days just over a week ago with a total of 3.5 liters removed. He also had the left effusion tapped once draining 850 ml. 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 He felt better initially after the thoracentesis but started becoming SOB again less than a week afterwards. He was admitted today with progressive shortness of breath.  Past Medical History   Diagnosis  Date   .  GERD (gastroesophageal reflux disease)      cannot taste anything   .  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  Metastatic cancer to spine  02/14/2013   History reviewed. No pertinent past surgical history.  History reviewed. No pertinent family history.  Social History: reports that he has never smoked. He has quit using smokeless tobacco. His smokeless tobacco use included Chew. He reports that he drinks alcohol. He reports that he does not use illicit drugs.  Allergies: No Known Allergies  Medications:  Prior to Admission:  Prescriptions prior to admission   Medication  Sig  Dispense  Refill   .  gabapentin (NEURONTIN) 100 MG capsule  Take 1 capsule (100 mg total) by mouth 3 (three) times daily.  90 capsule  1   .  lidocaine-prilocaine (EMLA) cream  Apply 1 application topically as needed (for port access (skin numbing)). Apply to port 1 hr before chemo     .  metoprolol tartrate (LOPRESSOR) 25 MG tablet  Take 1 tablet (25 mg total) by mouth 2 (two) times daily.  60 tablet   0   .  morphine (MS CONTIN) 30 MG 12 hr tablet  Take 30 mg by mouth every 12 (twelve) hours.     Marland Kitchen  oxyCODONE (OXY IR/ROXICODONE) 5 MG immediate release tablet  Take 5-10 mg by mouth every 6 (six) hours as needed for severe pain.     .  [DISCONTINUED] lidocaine-prilocaine (EMLA) cream  Apply 1 application topically as needed. Apply to port 1 hr before chemo  30 g  0    Results for orders placed during the hospital encounter of 07/22/13 (from the past 48 hour(s))   COMPREHENSIVE METABOLIC PANEL Status: Abnormal    Collection Time    07/22/13 11:59 AM   Result  Value  Ref Range    Sodium  133 (*)  137 - 147 mEq/L    Potassium  4.1  3.7 - 5.3 mEq/L    Chloride  96  96 - 112 mEq/L    CO2  26  19 - 32 mEq/L    Glucose, Bld  93  70 - 99 mg/dL    BUN  8  6 - 23 mg/dL    Creatinine, Ser  0.63  0.50 - 1.35 mg/dL    Calcium  8.1 (*)  8.4 - 10.5 mg/dL    Total Protein  5.7 (*)  6.0 - 8.3 g/dL    Albumin  2.2 (*)  3.5 - 5.2 g/dL  AST  74 (*)  0 - 37 U/L    ALT  26  0 - 53 U/L    Alkaline Phosphatase  150 (*)  39 - 117 U/L    Total Bilirubin  0.7  0.3 - 1.2 mg/dL    GFR calc non Af Amer  >90  >90 mL/min    GFR calc Af Amer  >90  >90 mL/min    Comment:  (NOTE)     The eGFR has been calculated using the CKD EPI equation.     This calculation has not been validated in all clinical situations.     eGFR's persistently <90 mL/min signify possible Chronic Kidney     Disease.    Anion gap  11  5 - 15   CBC WITH DIFFERENTIAL Status: Abnormal    Collection Time    07/22/13 11:59 AM   Result  Value  Ref Range    WBC  9.0  4.0 - 10.5 K/uL    RBC  2.92 (*)  4.22 - 5.81 MIL/uL    Hemoglobin  8.7 (*)  13.0 - 17.0 g/dL    HCT  25.6 (*)  39.0 - 52.0 %    MCV  87.7  78.0 - 100.0 fL    MCH  29.8  26.0 - 34.0 pg    MCHC  34.0  30.0 - 36.0 g/dL    RDW  18.4 (*)  11.5 - 15.5 %    Platelets  343  150 - 400 K/uL    Neutrophils Relative %  89 (*)  43 - 77 %    Lymphocytes Relative  2 (*)  12 - 46 %     Monocytes Relative  4  3 - 12 %    Eosinophils Relative  0  0 - 5 %    Basophils Relative  5 (*)  0 - 1 %    Neutro Abs  7.9 (*)  1.7 - 7.7 K/uL    Lymphs Abs  0.2 (*)  0.7 - 4.0 K/uL    Monocytes Absolute  0.4  0.1 - 1.0 K/uL    Eosinophils Absolute  0.0  0.0 - 0.7 K/uL    Basophils Absolute  0.5 (*)  0.0 - 0.1 K/uL    WBC Morphology  MILD LEFT SHIFT (1-5% METAS, OCC MYELO, OCC BANDS)     Smear Review  LARGE PLATELETS PRESENT    MAGNESIUM Status: Abnormal    Collection Time    07/22/13 11:59 AM   Result  Value  Ref Range    Magnesium  1.4 (*)  1.5 - 2.5 mg/dL   LACTATE DEHYDROGENASE Status: Abnormal    Collection Time    07/22/13 11:59 AM   Result  Value  Ref Range    LDH  397 (*)  94 - 250 U/L   PRO B NATRIURETIC PEPTIDE Status: Abnormal    Collection Time    07/22/13 11:59 AM   Result  Value  Ref Range    Pro B Natriuretic peptide (BNP)  174.1 (*)  0 - 125 pg/mL   TROPONIN I Status: None    Collection Time    07/22/13 11:59 AM   Result  Value  Ref Range    Troponin I  <0.30  <0.30 ng/mL    Comment:      Due to the release kinetics of cTnI,     a negative result within the first hours     of the onset  one component of a     comprehensive MRSA colonization     surveillance program. It is not     intended to diagnose MRSA     infection nor to guide or     monitor treatment for     MRSA infections.    Dg Chest 2 View  07/22/2013   CLINICAL DATA:  Shortness of breath.  Metastatic disease.  EXAM: CHEST  2 VIEW  COMPARISON:  Chest x-ray 07/14/2013.  FINDINGS: Power port noted with tip in superior vena cava. Right lower lobe atelectasis and or infiltrate and  right-sided pleural effusion again noted. No interim change. No pneumothorax. Heart size stable. No acute osseus abnormality. Prior thoracic spine fusion. Surgical clips right upper quadrant.  IMPRESSION: Stable right lower lobe atelectasis and/or consolidation with stable prominent right pleural effusion.   Electronically Signed   By: Thomas  Register   On: 07/22/2013 13:38    Review of Systems  Constitutional: Positive for malaise/fatigue. Negative for fever.  Respiratory: Positive for shortness of breath.   Cardiovascular: Positive for chest pain (right sided, pleuritic).  Gastrointestinal:       Dysphagia  Musculoskeletal: Positive for back pain.  All other systems reviewed and are negative.  Blood pressure 125/82, pulse 129, resp. rate 18, SpO2 95.00%. Physical Exam  Constitutional: He is oriented to person, place, and time. He appears well-developed. No distress.  HENT:  Mouth/Throat: No oropharyngeal exudate.  alopecia  Eyes: EOM are normal. Pupils are equal, round, and reactive to light.  Neck: Neck supple. No thyromegaly present.  Cardiovascular: Regular rhythm and normal heart sounds.   tachycardic  Respiratory: He has no wheezes.  Diminished BS right side  GI: Soft. There is no tenderness.  Musculoskeletal: He exhibits edema.  Lymphadenopathy:    He has no cervical adenopathy.  Neurological: He is alert and oriented to person, place, and time. No cranial nerve deficit.  Skin: Skin is warm and dry.    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 He has a large effusion that has recurred rapidly. He would benefit from a thoracentesis to relieve his symptoms in the short run, but needs a long term solution as well.  He would benefit from a pleur-x catheter for long term management of his malignant effusion. It would allow the pleural effusion to be drained on a regular basis until he achieves pleural  symphysis or, alternatively would provide a mechanism for talc pleurodesis should that be necessary.  I described the pleur-x catheter and system with the patient and his wife. They understand the indications, risks, benefits and alternatives. They understand the risks include, but are not limited to bleeding, infection, catheter malposition, as well as long term risks of infection or the catheter becoming nonfunctional. He accepts the risks and agrees to proceed.  I discussed the plan with Pete Babcock. He will do a thoracentesis this afternoon or tomorrow AM. This will provide immediate relief and allow him to complete his radiation therapy. Hopefully, he will be able to go home. I'll then plan to place a pleur-x catheter on Friday as an outpatient.   Shakeema Lippman C 07/22/2013, 4:19 PM        plan to place a pleur-x catheter on Friday as an outpatient.  Shantese Raven C  07/22/2013, 4:19 PM

## 2013-07-26 NOTE — OR Nursing (Signed)
Insertion of pleurex catheter right pleura  by Dr. Roxan Hockey with  1600 ml of bloody pleural fluid  drain from right chest.

## 2013-07-26 NOTE — Op Note (Signed)
Fred [name], Fred [name] NO.:  [id number]  MEDICAL RECORD NO.:  [id number]  LOCATION:  [facility]                         FACILITY:  [facility]  PHYSICIAN:  Revonda Standard. Roxan Hockey, M.D.DATE OF BIRTH:  [birth year]  DATE OF PROCEDURE:  07/26/2013 DATE OF DISCHARGE:  07/26/2013                              OPERATIVE REPORT   PREOPERATIVE DIAGNOSIS:  Recurrent malignant right pleural effusion.  POSTOPERATIVE DIAGNOSIS:  Recurrent malignant right pleural effusion.  PROCEDURE:  Insertion of right PleurX pleural drainage catheter.  SURGEON:  Revonda Standard. Roxan Hockey, M.D.  ASSISTANT:  None.  ANESTHESIA:  Local with intravenous sedation.  FINDINGS:  1.5 L of bloody fluid drained.  CLINICAL NOTE:  [name] is a 43 year old man with metastatic esophageal cancer.  He has had a recurrent right pleural effusion.  He was advised to have a PleurX catheter placed for ongoing management of the pleural effusion.  The indications, risks, benefits, and alternatives were discussed in detail with the patient.  He understood and accepted the risks and agreed to proceed.  OPERATIVE NOTE:  [name] was brought to the operating room on July 26, 2013.  He was given intravenous sedation and monitored by the Anesthesia Service.  The left chest was slightly elevated, and then prepped and draped in usual sterile fashion.  1% lidocaine was used for local anesthesia.  The initial site chosen for insertion of the catheter in the lateral chest wall was anesthetized and incision was made, however, attempts to access the pleural effusion with the small finder needle through this incision were unsuccessful.  This incision was closed with a 3-0 Vicryl subcuticular suture.  The second area just superior to that was anesthetized and the finder needle did access the pleural effusion, which was bloody.  A small incision was made.  A needle was advanced into the effusion and a wire passed through the needle.   Fluoroscopy confirmed positioning of the wire within the right pleural space.  The area for the exit site then was anesthetized as was the subcutaneous tissue connecting the 2 incisions.  A small stab incision was made at the exit site.  The catheter then was tunneled from the exit site to the entry site with the cuff being left just under the skin at the exit site.  The tract then was dilated.  The peel-away sheath introducer was placed.  The catheter was advanced through the peel-away sheath introducer which was removed.  The catheter was placed to a suction canister and 1 mL of bloody fluid was evacuated.  It was connected to a second suction canister and an additional 500 mL of fluid was removed.  The patient did experience coughing at the completion of the drainage.  The incision at the entry site then was closed with a 3-0 Vicryl subcuticular suture.  The catheter was secured at the exit site with a 3-0 nylon suture.  The catheter was dressed in the standard fashion.  Dermabond was applied over the incisions.  The patient then was taken from the operating room to the postanesthetic care unit in good condition.     Revonda Standard Roxan Hockey, M.D.  SCH/MEDQ  D:  07/26/2013  T:  07/26/2013  Job:  [id number]

## 2013-07-26 NOTE — Care Management (Signed)
CARE MANAGEMENT NOTE 07/26/2013  Patient:  Fred [name], Fred [name]   Account Number:  [id number]  Date Initiated:  07/26/2013  Documentation initiated by:  [facility]  Subjective/Objective Assessment:   43 yo with malignant pleural effusion in today for a pleurx drain. Plan home today.     Action/Plan:   Spoke with wife Larene Beach at bedside. They have no provider preference. I faxed prescription to [facility] and gave her copies of everything.   Anticipated DC Date:  07/26/2013   Anticipated DC Plan:  [place]  CM consult      [facility] Choice  HOME HEALTH   Choice offered to / List presented to:  C-3 Spouse        HH arranged  HH-1 RN      [place].   Status of service:  Completed, signed off Medicare Important Message given?  NA - LOS <3 / Initial given by admissions (If response is "NO", the following Medicare IM given date fields will be blank) Date Medicare IM given:   Medicare IM given by:   Date Additional Medicare IM given:   Additional Medicare IM given by:    Discharge Disposition:  [place]  Per UR Regulation:    If discussed at Long Length of Stay Meetings, dates discussed:    Comments:  07.24.15 Deveron Furlong, RN BSN ACM [phone] Referral to [facility] at [place]. SOC this weekend 24-48h.

## 2013-07-26 NOTE — Progress Notes (Signed)
Dr.Hendrickson at bedside, states pt has pain meds at home, no new RX needed, pt going home, awaiting Box of pleurx cath

## 2013-07-26 NOTE — Interval H&P Note (Signed)
History and Physical Interval Note:  07/26/2013 10:00 AM  Fred [name]  has presented today for surgery, with the diagnosis of RECURRENT MALIGNANT RIGHT PLEURAL EFFUSION  The various methods of treatment have been discussed with the patient and family. After consideration of risks, benefits and other options for treatment, the patient has consented to  Procedure(s): INSERTION PLEURAL DRAINAGE CATHETER (Right) as a surgical intervention .  The patient's history has been reviewed, patient examined, no change in status, stable for surgery.  I have reviewed the patient's chart and labs.  Questions were answered to the patient's satisfaction.     Laporsche Hoeger C

## 2013-07-26 NOTE — Transfer of Care (Signed)
Immediate Anesthesia Transfer of Care Note  Patient: Fred [name]  Procedure(s) Performed: Procedure(s): INSERTION PLEURAL DRAINAGE CATHETER (Right)  Patient Location: PACU  Anesthesia Type:MAC  Level of Consciousness: awake, alert  and oriented  Airway & Oxygen Therapy: Patient Spontanous Breathing and Patient connected to nasal cannula oxygen  Post-op Assessment: Report given to PACU RN  Post vital signs: Reviewed and stable  Complications: No apparent anesthesia complications

## 2013-07-27 NOTE — Care Management Note (Signed)
    Page 1 of 1   07/27/2013     8:14:33 AM CARE MANAGEMENT NOTE 07/27/2013  Patient:  Fred [name], Fred [name]   Account Number:  [id number]  Date Initiated:  07/22/2013  Documentation initiated by:  DAVIS,RHONDA  Subjective/Objective Assessment:   pna with pleural effusion     Action/Plan:   Anticipated DC Date:  07/25/2013   Anticipated DC Plan:  HOME/SELF CARE         Choice offered to / List presented to:          [facility] arranged  HH-1 RN      [place].   Status of service:  Completed, signed off Medicare Important Message given?  NA - LOS <3 / Initial given by admissions (If response is "NO", the following Medicare IM given date fields will be blank) Date Medicare IM given:   Medicare IM given by:   Date Additional Medicare IM given:   Additional Medicare IM given by:    Discharge Disposition:  HOME/SELF CARE  Per UR Regulation:  Reviewed for med. necessity/level of care/duration of stay  If discussed at [place] of Stay Meetings, dates discussed:    Comments:  07/27/13 08:00 CM called pt to see if [facility] had been set up with pt.  CM spoke to pt's spouse who states HHRN had been set up for her husband.  No other CM needs were communicated.  Mariane Masters, BSN, CM [phone].

## 2013-07-28 NOTE — Progress Notes (Signed)
  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  Narrative: The patient tolerated radiation treatment relatively well.   His dysphagia improved minimally.  He was admitted and hospitalized for pleural effusions.  Plan: The patient has completed radiation treatment. The patient will return to radiation oncology clinic for routine followup in one month. I advised them to call or return sooner if they have any questions or concerns related to their recovery or treatment. ________________________________  Sheral Apley. [name], M.D.

## 2013-07-29 ENCOUNTER — Telehealth: Payer: Self-pay | Admitting: Medical Oncology

## 2013-07-29 NOTE — Telephone Encounter (Signed)
Having throat pain and foamy spit. She aske dif he should take his prilosec and I told her yes. I sent Onc Tx request for f/u

## 2013-07-30 ENCOUNTER — Encounter (HOSPITAL_COMMUNITY): Payer: Self-pay | Admitting: Thoracic Surgery (Cardiothoracic Vascular Surgery)

## 2013-08-01 ENCOUNTER — Telehealth: Payer: Self-pay | Admitting: *Deleted

## 2013-08-01 ENCOUNTER — Other Ambulatory Visit (HOSPITAL_BASED_OUTPATIENT_CLINIC_OR_DEPARTMENT_OTHER): Payer: BC Managed Care – PPO

## 2013-08-01 ENCOUNTER — Ambulatory Visit: Payer: BC Managed Care – PPO

## 2013-08-01 ENCOUNTER — Encounter: Payer: BC Managed Care – PPO | Admitting: Nutrition

## 2013-08-01 ENCOUNTER — Other Ambulatory Visit: Payer: Self-pay | Admitting: Medical Oncology

## 2013-08-01 DIAGNOSIS — C155 Malignant neoplasm of lower third of esophagus: Secondary | ICD-10-CM

## 2013-08-01 DIAGNOSIS — C159 Malignant neoplasm of esophagus, unspecified: Secondary | ICD-10-CM

## 2013-08-01 LAB — CBC WITH DIFFERENTIAL/PLATELET
BASO%: 0.7 % (ref 0.0–2.0)
Basophils Absolute: 0.2 10*3/uL — ABNORMAL HIGH (ref 0.0–0.1)
EOS%: 0 % (ref 0.0–7.0)
Eosinophils Absolute: 0 10*3/uL (ref 0.0–0.5)
HCT: 34.7 % — ABNORMAL LOW (ref 38.4–49.9)
HGB: 11.2 g/dL — ABNORMAL LOW (ref 13.0–17.1)
LYMPH%: 4.3 % — AB (ref 14.0–49.0)
MCH: 29.1 pg (ref 27.2–33.4)
MCHC: 32.3 g/dL (ref 32.0–36.0)
MCV: 90.1 fL (ref 79.3–98.0)
MONO#: 3.1 10*3/uL — AB (ref 0.1–0.9)
MONO%: 11.1 % (ref 0.0–14.0)
NEUT#: 23.4 10*3/uL — ABNORMAL HIGH (ref 1.5–6.5)
NEUT%: 83.9 % — ABNORMAL HIGH (ref 39.0–75.0)
PLATELETS: 245 10*3/uL (ref 140–400)
RBC: 3.85 10*6/uL — AB (ref 4.20–5.82)
RDW: 20.1 % — ABNORMAL HIGH (ref 11.0–14.6)
WBC: 27.9 10*3/uL — AB (ref 4.0–10.3)
lymph#: 1.2 10*3/uL (ref 0.9–3.3)

## 2013-08-01 LAB — COMPREHENSIVE METABOLIC PANEL (CC13)
ALK PHOS: 197 U/L — AB (ref 40–150)
ALT: 16 U/L (ref 0–55)
AST: 50 U/L — ABNORMAL HIGH (ref 5–34)
Albumin: 2.2 g/dL — ABNORMAL LOW (ref 3.5–5.0)
Anion Gap: 10 mEq/L (ref 3–11)
BILIRUBIN TOTAL: 0.86 mg/dL (ref 0.20–1.20)
BUN: 7.5 mg/dL (ref 7.0–26.0)
CO2: 25 meq/L (ref 22–29)
Calcium: 8.5 mg/dL (ref 8.4–10.4)
Chloride: 104 mEq/L (ref 98–109)
Creatinine: 0.7 mg/dL (ref 0.7–1.3)
Glucose: 108 mg/dl (ref 70–140)
Potassium: 3.9 mEq/L (ref 3.5–5.1)
SODIUM: 139 meq/L (ref 136–145)
Total Protein: 5.5 g/dL — ABNORMAL LOW (ref 6.4–8.3)

## 2013-08-01 NOTE — Telephone Encounter (Signed)
Per POF to have pt date change from 8/6 to 8/7

## 2013-08-01 NOTE — Progress Notes (Signed)
reviewed labs with pt.He has a little  blood in left nostril . No bleeding. I recommended saline nose drops.

## 2013-08-02 ENCOUNTER — Ambulatory Visit: Payer: BC Managed Care – PPO

## 2013-08-02 ENCOUNTER — Telehealth: Payer: Self-pay | Admitting: Physician Assistant

## 2013-08-02 ENCOUNTER — Other Ambulatory Visit: Payer: BC Managed Care – PPO

## 2013-08-02 NOTE — Telephone Encounter (Signed)
Per 07/27 POF labs/ov, mailed AVS to pt...Fred [name]

## 2013-08-02 NOTE — Care Management Note (Signed)
    Page 1 of 1   07/26/2013     2:36:05 PM CARE MANAGEMENT NOTE 07/26/2013  Patient:  Fred Fred [name], Fred Fred [name]   Account Number:  [id number]  Date Initiated:  07/26/2013  Documentation initiated by:  [facility]  Subjective/Objective Assessment:   43 yo with malignant pleural effusion in today for Fred [name] pleurx drain. Plan home today.     Action/Plan:   [place]koe with wife Fred Fred [name] at bedside. They have no provider preference. I faxed prescription to [facility] and gave her copies of everything.   Anticipated DC Date:  07/26/2013   Anticipated DC Plan:  [name]  CM consult      [facility] Choice  HOME HEALTH   Choice offered to / List presented to:  C-3 Spouse        HH arranged  HH-1 RN      [place].   Status of service:  Completed, signed off Medicare Important Message given?  NA - LOS <3 / Initial given by admissions (If response is "NO", the following Medicare IM given date fields will be blank) Date Medicare IM given:   Medicare IM given by:   Date Additional Medicare IM given:   Additional Medicare IM given by:    Discharge Disposition:  [place]  Per UR Regulation:    If discussed at Long Length of Stay Meetings, dates discussed:    Comments:  07.24.15 Deveron Furlong, RN BSN ACM [phone] Referral to [place] at [place]. SOC this weekend 24-48h.

## 2013-08-05 ENCOUNTER — Other Ambulatory Visit: Payer: Self-pay | Admitting: Medical Oncology

## 2013-08-05 ENCOUNTER — Telehealth: Payer: Self-pay | Admitting: Internal Medicine

## 2013-08-05 ENCOUNTER — Other Ambulatory Visit: Payer: Self-pay | Admitting: *Deleted

## 2013-08-05 DIAGNOSIS — C159 Malignant neoplasm of esophagus, unspecified: Secondary | ICD-10-CM

## 2013-08-05 DIAGNOSIS — C3490 Malignant neoplasm of unspecified part of unspecified bronchus or lung: Secondary | ICD-10-CM

## 2013-08-05 DIAGNOSIS — J90 Pleural effusion, not elsewhere classified: Secondary | ICD-10-CM

## 2013-08-05 DIAGNOSIS — C7951 Secondary malignant neoplasm of bone: Secondary | ICD-10-CM

## 2013-08-05 DIAGNOSIS — J910 Malignant pleural effusion: Secondary | ICD-10-CM

## 2013-08-05 MED ORDER — OXYCODONE HCL 5 MG PO TABS: 5.0000 mg | ORAL_TABLET | Freq: Four times a day (QID) | ORAL | Status: DC | PRN

## 2013-08-05 NOTE — Telephone Encounter (Signed)
rx locked in injection room

## 2013-08-05 NOTE — Telephone Encounter (Signed)
Pt's wife Larene Beach cld to confirm labs/ov per 07/27 POF, advised of visits and advised mailed copy went out on 07/31.Marland KitchenMarland KitchenKJ

## 2013-08-06 ENCOUNTER — Ambulatory Visit
Admission: RE | Admit: 2013-08-06 | Discharge: 2013-08-06 | Disposition: A | Discharge status: Home / Self Care | Payer: BC Managed Care – PPO | Source: Ambulatory Visit | Attending: Thoracic Surgery (Cardiothoracic Vascular Surgery) | Admitting: Thoracic Surgery (Cardiothoracic Vascular Surgery)

## 2013-08-06 ENCOUNTER — Other Ambulatory Visit: Payer: Self-pay | Admitting: *Deleted

## 2013-08-06 ENCOUNTER — Encounter: Payer: Self-pay | Admitting: Thoracic Surgery (Cardiothoracic Vascular Surgery)

## 2013-08-06 ENCOUNTER — Ambulatory Visit (INDEPENDENT_AMBULATORY_CARE_PROVIDER_SITE_OTHER): Payer: Self-pay | Admitting: Thoracic Surgery (Cardiothoracic Vascular Surgery)

## 2013-08-06 VITALS — BP 108/78 | HR 88 | Ht 72.0 in | Wt 218.0 lb

## 2013-08-06 DIAGNOSIS — C159 Malignant neoplasm of esophagus, unspecified: Secondary | ICD-10-CM

## 2013-08-06 DIAGNOSIS — J90 Pleural effusion, not elsewhere classified: Secondary | ICD-10-CM

## 2013-08-06 DIAGNOSIS — J910 Malignant pleural effusion: Secondary | ICD-10-CM

## 2013-08-06 NOTE — Progress Notes (Signed)
HPI:  Mr. Fred [name] is a 43 yo man with stage IV esophageal cancer who has a malignant right pleural effusion.  He developed bilateral pleural effusions- right > left. Thoracentesis of the right was performed on multiple occasions a few weeks ago with a total of 3.5 liters removed. He also had the left effusion tapped once draining 850 ml. 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 His symptoms recurred after about a week and he was readmitted. He completed his radiation and then I placed a right pleural catheter on 07/26/2013. We drained 1.5 liters at the time of catheter placement.  He has been draining every other day since then. He says that he has drained about 250 mL on average. On one occasion there was no drainage. Earlier today 300 mL were drained. He says the drainage was slowing down he began experiencing significant chest pain and asked the nurse to stop. He does say that his breathing has improved from where was prior to placement of the catheter.  His wife is concerned about "bruising" on his upper left back  Past Medical History  Diagnosis Date  . GERD (gastroesophageal reflux disease)     cannot taste anything  . 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 Metastatic cancer to spine 02/14/2013  ' Malignant right pleural effusion    Current Outpatient Prescriptions  Medication Sig Dispense Refill  . gabapentin (NEURONTIN) 100 MG capsule Take 1 capsule (100 mg total) by mouth 3 (three) times daily.  90 capsule  1  . lidocaine-prilocaine (EMLA) cream Apply 1 application topically as needed (for port access (skin numbing)). Apply to port 1 hr before chemo      . metoprolol tartrate (LOPRESSOR) 25 MG tablet Take 1 tablet (25 mg total) by mouth 2 (two) times daily.  60 tablet  0  . morphine (MS CONTIN) 30 MG 12 hr tablet Take 30 mg by mouth every 12 (twelve) hours.      Marland Kitchen oxyCODONE (OXY IR/ROXICODONE) 5 MG immediate release tablet Take  1-2 tablets (5-10 mg total) by mouth every 6 (six) hours as needed for severe pain.  30 tablet  0   No current facility-administered medications for this visit.   Facility-Administered Medications Ordered in Other Visits  Medication Dose Route Frequency Provider Last Rate Last Dose  . sodium chloride 0.9 % injection 10 mL  10 mL Intravenous PRN Curt Bears, MD   10 mL at 04/29/13 1105    Physical Exam BP 108/78  Pulse 88  Ht 6' (1.829 m)  Wt 218 lb (98.884 kg)  BMI 29.56 kg/m2  SpO66 38% 43 year old man in obvious discomfort Diminished breath sounds right base Catheter dressed, no erythema apparent Some superficial desquamation of the skin posterior left chest  Diagnostic Tests: CHEST 2 VIEW  COMPARISON: 07/26/2013  FINDINGS:  There is a stable large right-sided pleural effusion identified  similar to that seen on the prior exam. A right chest wall port is  again noted and stable. Postsurgical changes in the mid thoracic  spine are seen. Small left-sided pleural effusion is noted also  stable from the prior study.  IMPRESSION:  Bilateral pleural effusions right greater than left stable from the  previous exam. No acute abnormality is noted.  Electronically Signed  By: Inez Catalina M.D.  On: 08/06/2013 15:39         Impression/ Plan:  43 year old gentleman with metastatic esophageal cancer with a malignant right pleural effusion. He  had placement of a Pleurx catheter 10 days ago. His chest x-ray is improved from his pre-drainage film although he does still have a sizable right pleural effusion laterally. He has only been draining an average of about 250 mL. Today his drainage had to be stopped at 300 mL due to discomfort.  I am concerned given the appearance of his chest x-ray there may be some loculated fluid it is not being accessed by the catheter. However, symptomatically his breathing is improved from preop, which is our therapeutic goal in this situation.  I  recommended that he continue with every other day drainage and then plan to return in 2 weeks with a PA and lateral chest x-ray  Regarding the "bruising" on his left back. That appears to be radiation changes to the skin with some superficial desquamation. I recommended they contact Dr. Johny Shears office regarding that.

## 2013-08-08 ENCOUNTER — Telehealth: Payer: Self-pay | Admitting: *Deleted

## 2013-08-08 ENCOUNTER — Encounter: Payer: BC Managed Care – PPO | Admitting: Nutrition

## 2013-08-08 ENCOUNTER — Other Ambulatory Visit: Payer: BC Managed Care – PPO

## 2013-08-08 ENCOUNTER — Ambulatory Visit: Payer: BC Managed Care – PPO

## 2013-08-08 NOTE — Telephone Encounter (Signed)
Late Entry 08/07/13:  Spoke with pt's wife about questions concerns regarding pt's status.  She wanted to know if she should go back to work.  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  Per Dr Vista Mink, she should wait to return to work for now because over the next weeks and months he will need more and more assistance.  She verbalized understanding.  SLJ

## 2013-08-08 NOTE — Telephone Encounter (Signed)
Per POF  pharmacy request

## 2013-08-09 ENCOUNTER — Ambulatory Visit (HOSPITAL_BASED_OUTPATIENT_CLINIC_OR_DEPARTMENT_OTHER): Payer: BC Managed Care – PPO | Admitting: Physician Assistant

## 2013-08-09 ENCOUNTER — Ambulatory Visit (HOSPITAL_BASED_OUTPATIENT_CLINIC_OR_DEPARTMENT_OTHER): Payer: BC Managed Care – PPO

## 2013-08-09 ENCOUNTER — Encounter: Payer: Self-pay | Admitting: Physician Assistant

## 2013-08-09 ENCOUNTER — Other Ambulatory Visit (HOSPITAL_BASED_OUTPATIENT_CLINIC_OR_DEPARTMENT_OTHER): Payer: BC Managed Care – PPO

## 2013-08-09 ENCOUNTER — Other Ambulatory Visit: Payer: BC Managed Care – PPO

## 2013-08-09 ENCOUNTER — Other Ambulatory Visit: Payer: Self-pay | Admitting: Oncology

## 2013-08-09 ENCOUNTER — Ambulatory Visit: Payer: BC Managed Care – PPO

## 2013-08-09 ENCOUNTER — Ambulatory Visit: Payer: BC Managed Care – PPO | Admitting: Nutrition

## 2013-08-09 ENCOUNTER — Encounter: Payer: Self-pay | Admitting: Oncology

## 2013-08-09 VITALS — BP 109/73 | HR 134 | Temp 98.2°F | Resp 19 | Ht 72.0 in | Wt 203.2 lb

## 2013-08-09 DIAGNOSIS — C155 Malignant neoplasm of lower third of esophagus: Secondary | ICD-10-CM

## 2013-08-09 DIAGNOSIS — Z95828 Presence of other vascular implants and grafts: Secondary | ICD-10-CM

## 2013-08-09 DIAGNOSIS — C787 Secondary malignant neoplasm of liver and intrahepatic bile duct: Secondary | ICD-10-CM

## 2013-08-09 DIAGNOSIS — C159 Malignant neoplasm of esophagus, unspecified: Secondary | ICD-10-CM

## 2013-08-09 DIAGNOSIS — R634 Abnormal weight loss: Secondary | ICD-10-CM

## 2013-08-09 DIAGNOSIS — C7800 Secondary malignant neoplasm of unspecified lung: Secondary | ICD-10-CM

## 2013-08-09 DIAGNOSIS — Z5112 Encounter for antineoplastic immunotherapy: Secondary | ICD-10-CM

## 2013-08-09 DIAGNOSIS — Z5111 Encounter for antineoplastic chemotherapy: Secondary | ICD-10-CM

## 2013-08-09 LAB — CBC WITH DIFFERENTIAL/PLATELET
BASO%: 1 % (ref 0.0–2.0)
Basophils Absolute: 0.1 10*3/uL (ref 0.0–0.1)
EOS%: 0 % (ref 0.0–7.0)
Eosinophils Absolute: 0 10*3/uL (ref 0.0–0.5)
HEMATOCRIT: 36 % — AB (ref 38.4–49.9)
HGB: 11.5 g/dL — ABNORMAL LOW (ref 13.0–17.1)
LYMPH%: 10.4 % — AB (ref 14.0–49.0)
MCH: 29.1 pg (ref 27.2–33.4)
MCHC: 32.1 g/dL (ref 32.0–36.0)
MCV: 90.6 fL (ref 79.3–98.0)
MONO#: 2.2 10*3/uL — ABNORMAL HIGH (ref 0.1–0.9)
MONO%: 18.1 % — ABNORMAL HIGH (ref 0.0–14.0)
NEUT#: 8.6 10*3/uL — ABNORMAL HIGH (ref 1.5–6.5)
NEUT%: 70.5 % (ref 39.0–75.0)
PLATELETS: 373 10*3/uL (ref 140–400)
RBC: 3.97 10*6/uL — ABNORMAL LOW (ref 4.20–5.82)
RDW: 21.7 % — ABNORMAL HIGH (ref 11.0–14.6)
WBC: 12.2 10*3/uL — ABNORMAL HIGH (ref 4.0–10.3)
lymph#: 1.3 10*3/uL (ref 0.9–3.3)

## 2013-08-09 LAB — COMPREHENSIVE METABOLIC PANEL (CC13)
ALT: 11 U/L (ref 0–55)
AST: 37 U/L — AB (ref 5–34)
Albumin: 2.2 g/dL — ABNORMAL LOW (ref 3.5–5.0)
Alkaline Phosphatase: 122 U/L (ref 40–150)
Anion Gap: 11 mEq/L (ref 3–11)
BUN: 6.3 mg/dL — AB (ref 7.0–26.0)
CO2: 24 mEq/L (ref 22–29)
CREATININE: 0.7 mg/dL (ref 0.7–1.3)
Calcium: 8.3 mg/dL — ABNORMAL LOW (ref 8.4–10.4)
Chloride: 101 mEq/L (ref 98–109)
Glucose: 107 mg/dl (ref 70–140)
Potassium: 3.9 mEq/L (ref 3.5–5.1)
Sodium: 137 mEq/L (ref 136–145)
Total Bilirubin: 0.95 mg/dL (ref 0.20–1.20)
Total Protein: 6.1 g/dL — ABNORMAL LOW (ref 6.4–8.3)

## 2013-08-09 MED ORDER — DIPHENHYDRAMINE HCL 50 MG/ML IJ SOLN
50.0000 mg | Freq: Once | INTRAMUSCULAR | Status: AC
  Administered 2013-08-09: 50 mg via INTRAVENOUS

## 2013-08-09 MED ORDER — DEXAMETHASONE SODIUM PHOSPHATE 10 MG/ML IJ SOLN
INTRAMUSCULAR | Status: AC
  Filled 2013-08-09: qty 1

## 2013-08-09 MED ORDER — HEPARIN SOD (PORK) LOCK FLUSH 100 UNIT/ML IV SOLN
500.0000 [IU] | Freq: Once | INTRAVENOUS | Status: AC | PRN
  Administered 2013-08-09: 500 [IU]
  Filled 2013-08-09: qty 5

## 2013-08-09 MED ORDER — SODIUM CHLORIDE 0.9 % IV SOLN
500.0000 mL | Freq: Once | INTRAVENOUS | Status: AC
  Administered 2013-08-09: 500 mL via INTRAVENOUS

## 2013-08-09 MED ORDER — ONDANSETRON 8 MG/50ML IVPB (CHCC)
8.0000 mg | Freq: Once | INTRAVENOUS | Status: AC
  Administered 2013-08-09 (×2): 8 mg via INTRAVENOUS

## 2013-08-09 MED ORDER — SODIUM CHLORIDE 0.9 % IV SOLN
Freq: Once | INTRAVENOUS | Status: AC
  Administered 2013-08-09: 10:00:00 via INTRAVENOUS

## 2013-08-09 MED ORDER — ONDANSETRON 8 MG/NS 50 ML IVPB
INTRAVENOUS | Status: AC
  Filled 2013-08-09: qty 8

## 2013-08-09 MED ORDER — DEXTROSE 5 % IV SOLN
75.0000 mg/m2 | Freq: Once | INTRAVENOUS | Status: AC
  Administered 2013-08-09: 170 mg via INTRAVENOUS
  Filled 2013-08-09: qty 17

## 2013-08-09 MED ORDER — DIPHENHYDRAMINE HCL 25 MG PO CAPS
ORAL_CAPSULE | ORAL | Status: AC
  Filled 2013-08-09: qty 2

## 2013-08-09 MED ORDER — SODIUM CHLORIDE 0.9 % IJ SOLN
10.0000 mL | INTRAMUSCULAR | Status: DC | PRN
  Administered 2013-08-09: 10 mL via INTRAVENOUS
  Filled 2013-08-09: qty 10

## 2013-08-09 MED ORDER — DEXAMETHASONE SODIUM PHOSPHATE 10 MG/ML IJ SOLN
10.0000 mg | Freq: Once | INTRAMUSCULAR | Status: AC
  Administered 2013-08-09: 10 mg via INTRAVENOUS

## 2013-08-09 MED ORDER — SODIUM CHLORIDE 0.9 % IV SOLN
10.0000 mg/kg | Freq: Once | INTRAVENOUS | Status: AC
  Administered 2013-08-09: 900 mg via INTRAVENOUS
  Filled 2013-08-09: qty 90

## 2013-08-09 MED ORDER — ACETAMINOPHEN 325 MG PO TABS
650.0000 mg | ORAL_TABLET | Freq: Once | ORAL | Status: AC
  Administered 2013-08-09: 650 mg via ORAL

## 2013-08-09 MED ORDER — ONDANSETRON 8 MG PO TBDP: 8.0000 mg | ORAL_TABLET | Freq: Three times a day (TID) | ORAL | Status: AC | PRN

## 2013-08-09 MED ORDER — SODIUM CHLORIDE 0.9 % IJ SOLN
10.0000 mL | INTRAMUSCULAR | Status: DC | PRN
  Administered 2013-08-09: 10 mL
  Filled 2013-08-09: qty 10

## 2013-08-09 MED ORDER — ACETAMINOPHEN 325 MG PO TABS
ORAL_TABLET | ORAL | Status: AC
  Filled 2013-08-09: qty 2

## 2013-08-09 MED ORDER — RAMUCIRUMAB CHEMO INJECTION 500 MG/50ML: 900.0000 mg | Freq: Once | INTRAVENOUS | Status: DC

## 2013-08-09 MED ORDER — SODIUM CHLORIDE 0.9 % IV SOLN
920.0000 mg | Freq: Once | INTRAVENOUS | Status: DC
  Filled 2013-08-09: qty 92

## 2013-08-09 MED ORDER — MORPHINE SULFATE ER 30 MG PO TBCR: 30.0000 mg | EXTENDED_RELEASE_TABLET | Freq: Two times a day (BID) | ORAL | Status: DC

## 2013-08-09 MED ORDER — DIPHENHYDRAMINE HCL 50 MG/ML IJ SOLN
INTRAMUSCULAR | Status: AC
  Filled 2013-08-09: qty 1

## 2013-08-09 NOTE — Progress Notes (Signed)
43 year old male diagnosed with metastatic esophageal cancer.  He is a patient of Dr. Earlie Server.  Past medical history includes GERD, protein calorie malnutrition, dysphagia, and anemia.  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  Medications include Lopressor and Zofran.  Labs include an albumin 2.2 on August 7.  Height: 6 feet 0 inches. Weight: 203.2 pounds August 7. Usual body weight: Per patient 280 pounds.  Documented weight of 247 pounds March 2015. BMI: 29.58.  Patient reports he is concerned with severe weight loss.  He does have dysphagia with both solids and liquids.  He reports he has thick saliva and "bubbles."  Patient tolerates and enjoys Ensure Plus.  States intentions to begin smoothies with protein powder.  Patient meets criteria for severe malnutrition in the context of chronic illness as evidenced by 18% weight loss in 5 months and less than 75% energy intake for greater than one month.  Nutrition diagnosis: Unintended weight loss related to diagnosis of metastatic esophageal cancer as evidenced by 18% weight loss in 5 months.  Intervention: Patient educated to consume small, frequent, high-calorie, high-protein foods. Recommended patient try Carnation VHC to provide 530 calories, 22 g protein per 8 ounces. Provided samples of Carnation VHC. Provided education on making smoothies and shakes to increase calories and protein.  Provided fact sheets.   Educated patient on blenderizing a variety of foods for easier swallowing.  Questions answered.  Teach back method used.  Contact information was given.  Monitoring, evaluation, goals: Patient will tolerate increased calories and protein to minimize further weight loss.  Next visit: To schedule as needed.  Patient has my contact information and he develops questions before followup.   **Disclaimer: This note was dictated with voice recognition software. Similar sounding words can inadvertently be transcribed and this note may contain  transcription errors which may not have been corrected upon publication of note.**

## 2013-08-09 NOTE — Progress Notes (Signed)
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  MOLECULAR BIOMARKERS: Foundation one: Amplification of PIK3CA, RAF1, CCND1, MYC(equivocal), PRKCl, SOX2, EPHB1, FGF19, FGF3, FGF4, GATA6, TERC and [id number], [id number].  PRIOR THERAPY:  1) status post T8-T10 spinal fusion under the care of Dr. Vertell Limber.  2) status post a stereotactic radiotherapy to the T9 lesion under the care of Dr. [name]. 3) Systemic chemotherapy with EOX, (epirubicin, oxaliplatin and Xeloda) every 3 weeks. First dose on 03/18/2013, status post 3 cycles discontinued today secondary to disease progression.   4) systemic chemotherapy with cisplatin 30 mg/M2 and irinotecan 65 mg/M2 days 1 and 8 every 3 weeks. Status post 2 cycles   CURRENT THERAPY: systemic chemotherapy with docetaxel 75 mg/m2 and ramucirumab (Cyramza) 10 mg/kg. First cycle given on 07/19/2013. Status post one cycle.   Advanced directives: The patient does not have advanced directives and he was given information package.  INTERVAL HISTORY: Fred [name] 43 y.o. male returns to the clinic today for followup visit accompanied by his wife. He reports having difficulty with dry heaves, vomiting and nausea. He has been vomiting up his medications including his anti-emetics and the metoprolol that he takes for tachycardia. His weight is down approximately 15 pounds. He reports not having much of an appetite. He presents today prior to proceeding with his second cycle of systemic chemotherapy with docetaxel and ramucirumab. He denied having any significant fever or chills. He complains of having a lot of gas but the inability to belch.  MEDICAL HISTORY: Past Medical History  Diagnosis Date  . GERD (gastroesophageal reflux disease)    cannot taste anything  . 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  MEDICATIONS:  Current Outpatient Prescriptions  Medication Sig Dispense Refill  . gabapentin (NEURONTIN) 100 MG capsule Take 1 capsule (100 mg total) by mouth 3 (three) times daily.  90 capsule  1  . lidocaine-prilocaine (EMLA) cream Apply 1 application topically as needed (for port access (skin numbing)). Apply to port 1 hr before chemo      . metoprolol tartrate (LOPRESSOR) 25 MG tablet Take 1 tablet (25 mg total) by mouth 2 (two) times daily.  60 tablet  0  . morphine (MS CONTIN) 30 MG 12 hr tablet Take 1 tablet (30 mg total) by mouth every 12 (twelve) hours.  60 tablet  0  . oxyCODONE (OXY IR/ROXICODONE) 5 MG immediate release tablet Take 1-2 tablets (5-10 mg total) by mouth every 6 (six) hours as needed for severe pain.  30 tablet  0  . ondansetron (ZOFRAN-ODT) 8 MG disintegrating tablet Take 1 tablet (8 mg total) by mouth every 8 (eight) hours as needed for nausea or vomiting.  20 tablet  3   No current facility-administered medications for this visit.   Facility-Administered Medications Ordered in Other Visits  Medication Dose Route Frequency Provider Last Rate Last Dose  . sodium chloride 0.9 % injection 10 mL  10 mL Intravenous PRN Curt Bears, MD   10 mL at 04/29/13 1105  . sodium chloride 0.9 % injection 10 mL  10 mL Intracatheter PRN Carlton Adam, PA-C   10 mL at 08/09/13 1337    REVIEW OF  SYSTEMS:  Constitutional: positive for anorexia, fatigue and weight loss Eyes: negative Ears, nose, mouth, throat, and face: negative Respiratory: negative Cardiovascular: negative Gastrointestinal: positive for dysphagia, nausea, odynophagia and vomiting Genitourinary:negative Integument/breast: negative Hematologic/lymphatic: negative Musculoskeletal:negative Neurological: negative Behavioral/Psych:  negative Endocrine: negative Allergic/Immunologic: negative   PHYSICAL EXAMINATION: General appearance: alert, cooperative, fatigued and no distress Head: Normocephalic, without obvious abnormality, atraumatic Neck: no JVD, supple, symmetrical, trachea midline, thyroid not enlarged, symmetric, no tenderness/mass/nodules and Palpable small left supraclavicular lymph node Lymph nodes: Palpable small left supraclavicular lymph node Resp: clear to auscultation bilaterally Back: symmetric, no curvature. ROM normal. No CVA tenderness. Cardio: regular rate and rhythm, S1, S2 normal, no murmur, click, rub or gallop GI: soft, non-tender; bowel sounds normal; no masses,  no organomegaly Extremities: extremities normal, atraumatic, no cyanosis or edema Neurologic: Alert and oriented X 3, normal strength and tone. Normal symmetric reflexes. Normal coordination and gait  ECOG PERFORMANCE STATUS: 1 - Symptomatic but completely ambulatory  Blood pressure 109/73, pulse 134, temperature 98.2 F (36.8 C), resp. rate 19, height 6' (1.829 m), weight 203 lb 3.2 oz (92.171 kg), SpO2 97.00%.  LABORATORY DATA: Lab Results  Component Value Date   WBC 12.2* 08/09/2013   HGB 11.5* 08/09/2013   HCT 36.0* 08/09/2013   MCV 90.6 08/09/2013   PLT 373 08/09/2013      Chemistry      Component Value Date/Time   NA 137 08/09/2013 0844   NA 137 07/26/2013 0809   K 3.9 08/09/2013 0844   K 4.1 07/26/2013 0809   CL 96 07/26/2013 0809   CO2 24 08/09/2013 0844   CO2 24 07/26/2013 0809   BUN 6.3* 08/09/2013 0844   BUN 9 07/26/2013 0809   CREATININE 0.7 08/09/2013 0844   CREATININE 0.61 07/26/2013 0809      Component Value Date/Time   CALCIUM 8.3* 08/09/2013 0844   CALCIUM 8.2* 07/26/2013 0809   ALKPHOS 122 08/09/2013 0844   ALKPHOS 191* 07/26/2013 0809   AST 37* 08/09/2013 0844   AST 67* 07/26/2013 0809   ALT 11 08/09/2013 0844   ALT 25 07/26/2013 0809   BILITOT 0.95 08/09/2013 0844   BILITOT 0.6 07/26/2013 0809       RADIOGRAPHIC  STUDIES:  ASSESSMENT AND PLAN: This is a very pleasant and unfortunate 43 year old white male who is recently diagnosed with widely metastatic distal esophageal adenocarcinoma. The patient is status post stabilization of his thoracic vertebrae with resection of tumor from that area, followed by stereotactic radiotherapy. The patient completed a course of systemic chemotherapy with EOX status post 3 cycles and tolerating it fairly well but unfortunately restaging CT scan of the chest, abdomen and pelvis showed evidence for disease progression with new lesions in the liver. He is status post 2 cycles of systemic chemotherapy with cisplatin and irinotecan.he tolerated this relatively well with the exception of a few episodes of diarrhea. Unfortunately recent restaging CT scans revealed evidence for disease progression. He is status post 1 cycle of systemic chemotherapy with docetaxel at 75 mg per meter squared and ramucirumab at 10 mg per kilogram with Neulasta support given every 3 weeks as scheduled. For nausea patient will start Zofran ODT 8 mg dissolved in mouth every 8 hours as needed for nausea and vomiting. We'll also try some over-the-counter gas ex or Gaviscon or similar product. For pain management the patient will continue on MS Contin 30 mg every 12 hours in addition to oxycodone for breakthrough pain. He was given refill  prescription for MS Contin, not to be filled before 08/16/2013. He will continue weekly labs as scheduled. He'll followup in 3 weeks prior to the start of cycle #3. Patient was reviewed with Dr. Alen Blew, regarding his weight loss and poor by mouth intake. He will be given at 500 mL of normal saline today. He was advised to call immediately if he has any concerning symptoms in the interval. The patient voices understanding of current disease status and treatment options and is in agreement with the current care plan. All questions were answered. The patient knows to call the clinic  with any problems, questions or concerns. We can certainly see the patient much sooner if necessary.  Disclaimer: This note was dictated with voice recognition software. Similar sounding words can inadvertently be transcribed and may not be corrected upon review.  Carlton Adam, PA-C 08/09/2013

## 2013-08-09 NOTE — Patient Instructions (Signed)
[facility] Discharge Instructions for Patients Receiving Chemotherapy  Today you received the following chemotherapy agents Cyramza/Docetaxel.  To help prevent nausea and vomiting after your treatment, we encourage you to take your nausea medication as directed.    If you develop nausea and vomiting that is not controlled by your nausea medication, call the clinic.   BELOW ARE SYMPTOMS THAT SHOULD BE REPORTED IMMEDIATELY:  *FEVER GREATER THAN 100.5 F  *CHILLS WITH OR WITHOUT FEVER  NAUSEA AND VOMITING THAT IS NOT CONTROLLED WITH YOUR NAUSEA MEDICATION  *UNUSUAL SHORTNESS OF BREATH  *UNUSUAL BRUISING OR BLEEDING  TENDERNESS IN MOUTH AND THROAT WITH OR WITHOUT PRESENCE OF ULCERS  *URINARY PROBLEMS  *BOWEL PROBLEMS  UNUSUAL RASH Items with * indicate a potential emergency and should be followed up as soon as possible.  Feel free to call the clinic you have any questions or concerns. The clinic phone number is [phone].   Dehydration, Adult Dehydration is when you lose more fluids from the body than you take in. Vital organs like the kidneys, brain, and heart cannot function without a proper amount of fluids and salt. Any loss of fluids from the body can cause dehydration.  CAUSES   Vomiting.  Diarrhea.  Excessive sweating.  Excessive urine output.  Fever. SYMPTOMS  Mild dehydration  Thirst.  Dry lips.  Slightly dry mouth. Moderate dehydration  Very dry mouth.  Sunken eyes.  Skin does not bounce back quickly when lightly pinched and released.  Dark urine and decreased urine production.  Decreased tear production.  Headache. Severe dehydration  Very dry mouth.  Extreme thirst.  Rapid, weak pulse (more than 100 beats per minute at rest).  Cold hands and feet.  Not able to sweat in spite of heat and temperature.  Rapid breathing.  Blue lips.  Confusion and lethargy.  Difficulty being awakened.  Minimal urine  production.  No tears. DIAGNOSIS  Your caregiver will diagnose dehydration based on your symptoms and your exam. Blood and urine tests will help confirm the diagnosis. The diagnostic evaluation should also identify the cause of dehydration. TREATMENT  Treatment of mild or moderate dehydration can often be done at home by increasing the amount of fluids that you drink. It is best to drink small amounts of fluid more often. Drinking too much at one time can make vomiting worse. Refer to the home care instructions below. Severe dehydration needs to be treated at the hospital where you will probably be given intravenous (IV) fluids that contain water and electrolytes. HOME CARE INSTRUCTIONS   Ask your caregiver about specific rehydration instructions.  Drink enough fluids to keep your urine clear or pale yellow.  Drink small amounts frequently if you have nausea and vomiting.  Eat as you normally do.  Avoid:  Foods or drinks high in sugar.  Carbonated drinks.  Juice.  Extremely hot or cold fluids.  Drinks with caffeine.  Fatty, greasy foods.  Alcohol.  Tobacco.  Overeating.  Gelatin desserts.  Wash your hands well to avoid spreading bacteria and viruses.  Only take over-the-counter or prescription medicines for pain, discomfort, or fever as directed by your caregiver.  Ask your caregiver if you should continue all prescribed and over-the-counter medicines.  Keep all follow-up appointments with your caregiver. SEEK MEDICAL CARE IF:  You have abdominal pain and it increases or stays in one area (localizes).  You have a rash, stiff neck, or severe headache.  You are irritable, sleepy, or difficult to awaken.  You are weak, dizzy, or extremely thirsty. SEEK IMMEDIATE MEDICAL CARE IF:   You are unable to keep fluids down or you get worse despite treatment.  You have frequent episodes of vomiting or diarrhea.  You have blood or green matter (bile) in your  vomit.  You have blood in your stool or your stool looks black and tarry.  You have not urinated in 6 to 8 hours, or you have only urinated a small amount of very dark urine.  You have a fever.  You faint. MAKE SURE YOU:   Understand these instructions.  Will watch your condition.  Will get help right away if you are not doing well or get worse. Document Released: 12/20/2004 Document Revised: 03/14/2011 Document Reviewed: 08/09/2010 [facility] Patient Information 2015 [place], [place]. This information is not intended to replace advice given to you by your health care provider. Make sure you discuss any questions you have with your health care provider.

## 2013-08-10 ENCOUNTER — Ambulatory Visit (HOSPITAL_BASED_OUTPATIENT_CLINIC_OR_DEPARTMENT_OTHER): Payer: BC Managed Care – PPO

## 2013-08-10 VITALS — BP 108/93 | HR 141 | Temp 98.0°F

## 2013-08-10 DIAGNOSIS — C155 Malignant neoplasm of lower third of esophagus: Secondary | ICD-10-CM

## 2013-08-10 DIAGNOSIS — C787 Secondary malignant neoplasm of liver and intrahepatic bile duct: Secondary | ICD-10-CM

## 2013-08-10 DIAGNOSIS — Z5189 Encounter for other specified aftercare: Secondary | ICD-10-CM

## 2013-08-10 MED ORDER — PEGFILGRASTIM INJECTION 6 MG/0.6ML
6.0000 mg | Freq: Once | SUBCUTANEOUS | Status: AC
  Administered 2013-08-10: 6 mg via SUBCUTANEOUS

## 2013-08-12 ENCOUNTER — Telehealth: Payer: Self-pay | Admitting: Internal Medicine

## 2013-08-12 ENCOUNTER — Telehealth: Payer: Self-pay | Admitting: *Deleted

## 2013-08-12 NOTE — Patient Instructions (Signed)
Use the Zofran ODT (dissolving tablet in (as prescribed Try an over-the-counter product such as Casodex or Gaviscon, follow with the directions Continue weekly labs as scheduled Followup in 3 weeks

## 2013-08-12 NOTE — Telephone Encounter (Signed)
Lft msg for pt added Labs/inj/ov per 08/07 POF, mailed updated schedule to pt...KJ

## 2013-08-12 NOTE — Telephone Encounter (Signed)
Per staff message and POF I have scheduled appts. Advised scheduler of appts. JMW  

## 2013-08-14 ENCOUNTER — Telehealth: Payer: Self-pay | Admitting: *Deleted

## 2013-08-14 NOTE — Telephone Encounter (Signed)
Wife called asking if patient needed to have CT scan. Note to [place]. Wife notified to keep appt.

## 2013-08-16 ENCOUNTER — Telehealth: Payer: Self-pay | Admitting: Medical Oncology

## 2013-08-16 NOTE — Telephone Encounter (Signed)
Message copied by Ardeen Garland on Fri Aug 16, 2013 12:38 PM ------      Message from: Renford Dills      Created: Fri Aug 16, 2013 10:34 AM                   ----- Message -----         From: Curt Bears, MD         Sent: 08/14/2013   5:47 PM           To: Renford Dills, RN            Will discuss next visit      ----- Message -----         From: Renford Dills, RN         Sent: 08/14/2013   1:43 PM           To: Curt Bears, MD, Ardeen Garland, RN            Wife called asking if patient needed to have CT scan. Next follow-up is 8/28            What do I need to tell her?       ------

## 2013-08-16 NOTE — Telephone Encounter (Signed)
Shannon notified.  °

## 2013-08-18 ENCOUNTER — Encounter: Payer: Self-pay | Admitting: Radiation Oncology

## 2013-08-18 NOTE — Progress Notes (Signed)
Radiation Oncology         [phone] ________________________________  Name: Fred [name] MRN: [id number]  Date: 08/19/2013  DOB: 1970-05-22  Multidisciplinary Neuro Oncology Clinic Follow-Up Visit Note  CC: [name], MD  [name], MD  Diagnosis:   43 yo man with a from metastatic esophageal cancer s/p 03/07/2013  T9 spinal metastasis s/p spinal SRS to 18 Gy. 07/03/2013-07/24/2013 Palliation of dysphagia distal esophagus was treated to 35 Gy  Interval Since Last Radiation:  4  weeks  Narrative:  The patient returns today for routine follow-up.  8 lb weight loss noted in just ten days. Reports he alternates between diarrhea and constipation. Discuss taking stool softener daily while taking pain medication once diarrhea resolves. Wife reports her husband sleep a lot more. Peeling of both hands and feet noted related to effects of chemotherapy. Dry cough noted. Patient weak, frail, and ashen. Denies headache. Reports low back pain and aching bones but, unable to score pain. Reports dysphagia continues. Horrible case of thrush noted. Requesting refill of oxy. Discuss checking bp daily prior to administration of bp medication                           ALLERGIES:  has No Known Allergies.  Meds: Current Outpatient Prescriptions  Medication Sig Dispense Refill  . gabapentin (NEURONTIN) 100 MG capsule Take 1 capsule (100 mg total) by mouth 3 (three) times daily.  90 capsule  1  . lidocaine-prilocaine (EMLA) cream Apply 1 application topically as needed (for port access (skin numbing)). Apply to port 1 hr before chemo      . metoprolol tartrate (LOPRESSOR) 25 MG tablet Take 1 tablet (25 mg total) by mouth 2 (two) times daily.  60 tablet  0  . morphine (MS CONTIN) 30 MG 12 hr tablet Take 1 tablet (30 mg total) by mouth every 12 (twelve) hours.  60 tablet  0  . ondansetron (ZOFRAN-ODT) 8 MG disintegrating tablet Take 1 tablet (8 mg total) by mouth every 8 (eight) hours as needed for  nausea or vomiting.  20 tablet  3  . oxyCODONE (OXY IR/ROXICODONE) 5 MG immediate release tablet Take 1-2 tablets (5-10 mg total) by mouth every 6 (six) hours as needed for severe pain.  30 tablet  0   No current facility-administered medications for this encounter.   Facility-Administered Medications Ordered in Other Encounters  Medication Dose Route Frequency Provider Last Rate Last Dose  . sodium chloride 0.9 % injection 10 mL  10 mL Intravenous PRN Curt Bears, MD   10 mL at 04/29/13 1105    Physical Findings: The patient is in no acute distress. Patient is alert and oriented.  weight is 194 lb 4.8 oz (88.134 kg). His oral temperature is 98.2 F (36.8 C). His blood pressure is 91/69 and his pulse is 148. His respiration is 20 and oxygen saturation is 94%. .  Florid thrush in oral cavity.  He has resolving radiation dermatitis on the posterior chest wall.  No significant changes.  Lab Findings: Lab Results  Component Value Date   WBC 12.2* 08/09/2013   HGB 11.5* 08/09/2013   HCT 36.0* 08/09/2013   MCV 90.6 08/09/2013   PLT 373 08/09/2013   Radiographic Findings: Dg Chest 2 View  08/06/2013   CLINICAL DATA:  Shortness of breath  EXAM: CHEST  2 VIEW  COMPARISON:  07/26/2013  FINDINGS: There is a stable large right-sided pleural effusion identified  similar to that seen on the prior exam. A right chest wall port is again noted and stable. Postsurgical changes in the mid thoracic spine are seen. Small left-sided pleural effusion is noted also stable from the prior study.  IMPRESSION: Bilateral pleural effusions right greater than left stable from the previous exam. No acute abnormality is noted.   Electronically Signed   By: Inez Catalina M.D.   On: 08/06/2013 15:39   Dg Chest 2 View Within Previous 72 Hours.  Films Obtained On Friday Are Acceptable For Monday And Tuesday Cases  07/26/2013   CLINICAL DATA:  43 year male with metastatic disease, esophageal cancer. Pleural effusion.  Subsequent encounter.  EXAM: CHEST  2 VIEW  COMPARISON:  07/23/2013 and earlier.  FINDINGS: PA and lateral views. Stable right chest porta cath, accessed. Moderate to large right pleural effusion not significantly changed. Stable cardiac size and mediastinal contours. Visualized tracheal air column is within normal limits. No pneumothorax or pulmonary edema. Small to moderate left pleural effusion also stable. Stable visualized osseous structures. Lower thoracic spinal hardware. No pulmonary nodule identified.  IMPRESSION: Stable, with moderate to large right and small to moderate left pleural effusions.   Electronically Signed   By: Lars Pinks M.D.   On: 07/26/2013 08:28   Dg Chest 2 View  07/22/2013   CLINICAL DATA:  Shortness of breath.  Metastatic disease.  EXAM: CHEST  2 VIEW  COMPARISON:  Chest x-ray 07/14/2013.  FINDINGS: Power port noted with tip in superior vena cava. Right lower lobe atelectasis and or infiltrate and right-sided pleural effusion again noted. No interim change. No pneumothorax. Heart size stable. No acute osseus abnormality. Prior thoracic spine fusion. Surgical clips right upper quadrant.  IMPRESSION: Stable right lower lobe atelectasis and/or consolidation with stable prominent right pleural effusion.   Electronically Signed   By: Marcello Moores  Register   On: 07/22/2013 13:38   Dg Chest Port 1 View  07/23/2013   CLINICAL DATA:  Status post thoracentesis.  EXAM: PORTABLE CHEST - 1 VIEW  COMPARISON:  07/22/2013  FINDINGS: The power port is stable. There is a persistent moderate to large right pleural effusion with overlying atelectasis. No pneumothorax. The left lung is clear. No definite left-sided pleural effusion.  IMPRESSION: Stable moderate to large right pleural effusion with overlying atelectasis. No pneumothorax.   Electronically Signed   By: Kalman Jewels M.D.   On: 07/23/2013 11:39   Dg C-arm 1-60 Min-no Report  07/26/2013   CLINICAL DATA: RECURRENT MALIGNANT RIGHT PLEURAL  EFFUSION   C-ARM 1-60 MINUTES  Fluoroscopy was utilized by the requesting physician.  No radiographic  interpretation.    Impression:  The patient is recovering from the effects of radiation.  He has thrush.  Plan:  Given diflucan, refilled oxycodone, and will follow as needed in rad-onc.  _____________________________________  Sheral Apley. [name], M.D.

## 2013-08-19 ENCOUNTER — Encounter: Payer: Self-pay | Admitting: Radiation Oncology

## 2013-08-19 ENCOUNTER — Ambulatory Visit
Admission: RE | Admit: 2013-08-19 | Discharge: 2013-08-19 | Disposition: A | Discharge status: Home / Self Care | Payer: BC Managed Care – PPO | Source: Ambulatory Visit | Attending: Radiation Oncology | Admitting: Radiation Oncology

## 2013-08-19 VITALS — BP 91/69 | HR 148 | Temp 98.2°F | Resp 20 | Wt 194.3 lb

## 2013-08-19 DIAGNOSIS — C159 Malignant neoplasm of esophagus, unspecified: Secondary | ICD-10-CM

## 2013-08-19 DIAGNOSIS — C7951 Secondary malignant neoplasm of bone: Secondary | ICD-10-CM

## 2013-08-19 MED ORDER — OXYCODONE HCL 5 MG PO TABS: 5.0000 mg | ORAL_TABLET | Freq: Four times a day (QID) | ORAL | Status: DC | PRN

## 2013-08-19 MED ORDER — FLUCONAZOLE 10 MG/ML PO SUSR: 100.0000 mg | Freq: Every day | ORAL | Status: AC

## 2013-08-19 NOTE — Progress Notes (Signed)
8 lb weight loss noted in just ten days. Reports he alternates between diarrhea and constipation. Discuss taking stool softener daily while taking pain medication once diarrhea resolves. Wife reports her husband sleep a lot more. Peeling of both hands and feet noted related to effects of chemotherapy. Dry cough noted. Patient weak, frail, and ashen. Denies headache. Reports low back pain and aching bones but, unable to score pain. Reports dysphagia continues. Horrible case of thrush noted. Requesting refill of oxy. Discuss checking bp daily prior to administration of bp medication.

## 2013-08-20 ENCOUNTER — Ambulatory Visit: Payer: BC Managed Care – PPO | Admitting: Thoracic Surgery (Cardiothoracic Vascular Surgery)

## 2013-08-21 ENCOUNTER — Other Ambulatory Visit: Payer: Self-pay | Admitting: *Deleted

## 2013-08-21 ENCOUNTER — Telehealth: Payer: Self-pay | Admitting: *Deleted

## 2013-08-21 DIAGNOSIS — C7800 Secondary malignant neoplasm of unspecified lung: Secondary | ICD-10-CM

## 2013-08-21 NOTE — Telephone Encounter (Signed)
Pt's wife called wanting to know if pt needs weekly lab work done.  Per Dr Vista Mink, pt does need weekly lab work.  Pt's wife also concerned that pt is dehydrated.  Per Dr Vista Mink okay for pt to come in for IVF 08/22/13.  Pt's wife is aware of appts.  They are requesting labs to be at 3:15 as close to infusion appt as possible because he cannot sit for long periods of time in the lobby.  SLJ

## 2013-08-22 ENCOUNTER — Other Ambulatory Visit (HOSPITAL_BASED_OUTPATIENT_CLINIC_OR_DEPARTMENT_OTHER): Payer: BC Managed Care – PPO

## 2013-08-22 ENCOUNTER — Ambulatory Visit (HOSPITAL_BASED_OUTPATIENT_CLINIC_OR_DEPARTMENT_OTHER): Payer: BC Managed Care – PPO | Admitting: Nurse Practitioner

## 2013-08-22 ENCOUNTER — Encounter: Payer: Self-pay | Admitting: Nurse Practitioner

## 2013-08-22 ENCOUNTER — Ambulatory Visit (HOSPITAL_BASED_OUTPATIENT_CLINIC_OR_DEPARTMENT_OTHER): Payer: BC Managed Care – PPO

## 2013-08-22 ENCOUNTER — Telehealth: Payer: Self-pay | Admitting: Nurse Practitioner

## 2013-08-22 ENCOUNTER — Other Ambulatory Visit: Payer: BC Managed Care – PPO

## 2013-08-22 VITALS — BP 108/75 | HR 119 | Temp 98.0°F

## 2013-08-22 DIAGNOSIS — E860 Dehydration: Secondary | ICD-10-CM

## 2013-08-22 DIAGNOSIS — R740 Nonspecific elevation of levels of transaminase and lactic acid dehydrogenase [LDH]: Secondary | ICD-10-CM

## 2013-08-22 DIAGNOSIS — C7952 Secondary malignant neoplasm of bone marrow: Secondary | ICD-10-CM

## 2013-08-22 DIAGNOSIS — R530 Neoplastic (malignant) related fatigue: Secondary | ICD-10-CM

## 2013-08-22 DIAGNOSIS — H1030 Unspecified acute conjunctivitis, unspecified eye: Secondary | ICD-10-CM

## 2013-08-22 DIAGNOSIS — R634 Abnormal weight loss: Secondary | ICD-10-CM

## 2013-08-22 DIAGNOSIS — R7402 Elevation of levels of lactic acid dehydrogenase (LDH): Secondary | ICD-10-CM

## 2013-08-22 DIAGNOSIS — C159 Malignant neoplasm of esophagus, unspecified: Secondary | ICD-10-CM

## 2013-08-22 DIAGNOSIS — C155 Malignant neoplasm of lower third of esophagus: Secondary | ICD-10-CM

## 2013-08-22 DIAGNOSIS — T451X5A Adverse effect of antineoplastic and immunosuppressive drugs, initial encounter: Secondary | ICD-10-CM

## 2013-08-22 DIAGNOSIS — G893 Neoplasm related pain (acute) (chronic): Secondary | ICD-10-CM

## 2013-08-22 DIAGNOSIS — C7951 Secondary malignant neoplasm of bone: Secondary | ICD-10-CM

## 2013-08-22 DIAGNOSIS — R630 Anorexia: Secondary | ICD-10-CM

## 2013-08-22 DIAGNOSIS — R7401 Elevation of levels of liver transaminase levels: Secondary | ICD-10-CM

## 2013-08-22 DIAGNOSIS — E46 Unspecified protein-calorie malnutrition: Secondary | ICD-10-CM

## 2013-08-22 DIAGNOSIS — C7800 Secondary malignant neoplasm of unspecified lung: Secondary | ICD-10-CM

## 2013-08-22 DIAGNOSIS — E8809 Other disorders of plasma-protein metabolism, not elsewhere classified: Secondary | ICD-10-CM

## 2013-08-22 DIAGNOSIS — R5383 Other fatigue: Secondary | ICD-10-CM | POA: Insufficient documentation

## 2013-08-22 DIAGNOSIS — H1033 Unspecified acute conjunctivitis, bilateral: Secondary | ICD-10-CM

## 2013-08-22 DIAGNOSIS — K1231 Oral mucositis (ulcerative) due to antineoplastic therapy: Secondary | ICD-10-CM

## 2013-08-22 DIAGNOSIS — D6481 Anemia due to antineoplastic chemotherapy: Secondary | ICD-10-CM | POA: Insufficient documentation

## 2013-08-22 LAB — COMPREHENSIVE METABOLIC PANEL (CC13)
ALT: 15 U/L (ref 0–55)
ANION GAP: 11 meq/L (ref 3–11)
AST: 58 U/L — ABNORMAL HIGH (ref 5–34)
Albumin: 2.2 g/dL — ABNORMAL LOW (ref 3.5–5.0)
Alkaline Phosphatase: 176 U/L — ABNORMAL HIGH (ref 40–150)
BILIRUBIN TOTAL: 1.14 mg/dL (ref 0.20–1.20)
BUN: 7.4 mg/dL (ref 7.0–26.0)
CALCIUM: 8.1 mg/dL — AB (ref 8.4–10.4)
CHLORIDE: 95 meq/L — AB (ref 98–109)
CO2: 25 meq/L (ref 22–29)
Creatinine: 0.7 mg/dL (ref 0.7–1.3)
Glucose: 145 mg/dl — ABNORMAL HIGH (ref 70–140)
Potassium: 3.9 mEq/L (ref 3.5–5.1)
SODIUM: 131 meq/L — AB (ref 136–145)
Total Protein: 5.7 g/dL — ABNORMAL LOW (ref 6.4–8.3)

## 2013-08-22 LAB — CBC WITH DIFFERENTIAL/PLATELET
BASO%: 0.5 % (ref 0.0–2.0)
Basophils Absolute: 0.1 10*3/uL (ref 0.0–0.1)
EOS%: 0 % (ref 0.0–7.0)
Eosinophils Absolute: 0 10*3/uL (ref 0.0–0.5)
HCT: 28.9 % — ABNORMAL LOW (ref 38.4–49.9)
HEMOGLOBIN: 9.2 g/dL — AB (ref 13.0–17.1)
LYMPH%: 5.2 % — ABNORMAL LOW (ref 14.0–49.0)
MCH: 29 pg (ref 27.2–33.4)
MCHC: 31.8 g/dL — ABNORMAL LOW (ref 32.0–36.0)
MCV: 91.2 fL (ref 79.3–98.0)
MONO#: 1.1 10*3/uL — ABNORMAL HIGH (ref 0.1–0.9)
MONO%: 6.9 % (ref 0.0–14.0)
NEUT#: 14 10*3/uL — ABNORMAL HIGH (ref 1.5–6.5)
NEUT%: 87.4 % — ABNORMAL HIGH (ref 39.0–75.0)
Platelets: 171 10*3/uL (ref 140–400)
RBC: 3.17 10*6/uL — AB (ref 4.20–5.82)
RDW: 21.7 % — AB (ref 11.0–14.6)
WBC: 16.1 10*3/uL — ABNORMAL HIGH (ref 4.0–10.3)
lymph#: 0.8 10*3/uL — ABNORMAL LOW (ref 0.9–3.3)

## 2013-08-22 MED ORDER — SODIUM CHLORIDE 0.9 % IJ SOLN
10.0000 mL | INTRAMUSCULAR | Status: DC | PRN
  Administered 2013-08-22: 10 mL via INTRAVENOUS
  Filled 2013-08-22: qty 10

## 2013-08-22 MED ORDER — SODIUM CHLORIDE 0.9 % IV SOLN: INTRAVENOUS | Status: DC

## 2013-08-22 MED ORDER — HEPARIN SOD (PORK) LOCK FLUSH 100 UNIT/ML IV SOLN
500.0000 [IU] | Freq: Once | INTRAVENOUS | Status: AC
  Administered 2013-08-22: 500 [IU] via INTRAVENOUS
  Filled 2013-08-22: qty 5

## 2013-08-22 MED ORDER — ERYTHROMYCIN 5 MG/GM OP OINT: 1.0000 "application " | TOPICAL_OINTMENT | Freq: Four times a day (QID) | OPHTHALMIC | Status: AC

## 2013-08-22 MED ORDER — SODIUM CHLORIDE 0.9 % IV SOLN
1000.0000 mL | Freq: Once | INTRAVENOUS | Status: AC
  Administered 2013-08-22: 1000 mL via INTRAVENOUS

## 2013-08-22 NOTE — Assessment & Plan Note (Signed)
Patient states he has minimal appetite at this time.  This is most likely multifactorial-due to both the chemotherapy and worsening issues with mucositis.  Patient was encouraged to take in as many a small meals throughout the day as he could.

## 2013-08-22 NOTE — Telephone Encounter (Deleted)
Sent msg to add IV fluids per NP/CB 08/20 POF....Cherylann Banas

## 2013-08-22 NOTE — Assessment & Plan Note (Signed)
Patient is suffering with some fairly severe mucositis at this time; and has had minimal appetite or oral intake.  The patient did receive a 1 L normal saline IV fluid rehydration while at the [facility] today.  Also, patient has plans to return tomorrow to receive another liter of normal saline IV fluid rehydration prior to the weekend.

## 2013-08-22 NOTE — Telephone Encounter (Signed)
Sent msg to add IV fluids per NP/CB 08/20 POF also added wkly labs, advised pt will mail out updated schedule.Marland Kitchen..KJ

## 2013-08-22 NOTE — Assessment & Plan Note (Signed)
Albumin continues to trend downward.  Abdomen is now at 2.2.  Patient was encouraged to push protein in the forms of either protein shakes or protein bars if at all possible.

## 2013-08-22 NOTE — Assessment & Plan Note (Signed)
Patient is currently undergoing docetaxel/cyramza chemotherapy regimen.  Patient received cycle 2 of his chemotherapy on 08/09/2013.  Cycle 3 of the same chemotherapy will be due 08/30/2013.

## 2013-08-22 NOTE — Assessment & Plan Note (Signed)
Abdomen has decreased from 11.5-9.2.  Patient is complaining of some worsening issues with fatigue; but this is most likely multifactorial including a chemotherapy side effect.  Will continue to monitor closely.

## 2013-08-22 NOTE — Assessment & Plan Note (Signed)
Patient does have a bilateral acute conjunctivitis.  Patient was prescribed tobramycin ointment to use on a 4 times per day.  Also advised the patient to followup with his ophthalmologist if any of worsening eye symptoms or vision changes.  He carefully review all care instructions with both patient and his wife regarding conjunctivitis.  Patient was encouraged to exercise handwashing after touching his eyes.

## 2013-08-22 NOTE — Assessment & Plan Note (Signed)
The patient does experience a good bit of pain with his bone metastasis.  He does complain of chronic back pain.  He states that his oxycodone has been managing his pain fairly well this time.

## 2013-08-22 NOTE — Assessment & Plan Note (Signed)
Alkaline phosphatase has increased from 122 up to 176.  Will continue to monitor closely.

## 2013-08-22 NOTE — Patient Instructions (Addendum)
Dehydration, Adult Dehydration means your body does not have as much fluid as it needs. Your kidneys, brain, and heart will not work properly without the right amount of fluids and salt.  HOME CARE  Ask your doctor how to replace body fluid losses (rehydrate).  Drink enough fluids to keep your pee (urine) clear or pale yellow.  Drink small amounts of fluids often if you feel sick to your stomach (nauseous) or throw up (vomit).  Eat like you normally do.  Avoid:  Foods or drinks high in sugar.  Bubbly (carbonated) drinks.  Juice.  Very hot or cold fluids.  Drinks with caffeine.  Fatty, greasy foods.  Alcohol.  Tobacco.  Eating too much.  Gelatin desserts.  Wash your hands to avoid spreading germs (bacteria, viruses).  Only take medicine as told by your doctor.  Keep all doctor visits as told. GET HELP RIGHT AWAY IF:   You cannot drink something without throwing up.  You get worse even with treatment.  Your vomit has blood in it or looks greenish.  Your poop (stool) has blood in it or looks black and tarry.  You have not peed in 6 to 8 hours.  You pee a small amount of very dark pee.  You have a fever.  You pass out (faint).  You have belly (abdominal) pain that gets worse or stays in one spot (localizes).  You have a rash, stiff neck, or bad headache.  You get easily annoyed, sleepy, or are hard to wake up.  You feel weak, dizzy, or very thirsty. MAKE SURE YOU:   Understand these instructions.  Will watch your condition.  Will get help right away if you are not doing well or get worse. Document Released: 10/16/2008 Document Revised: 03/14/2011 Document Reviewed: 08/09/2010 [facility] Patient Information 2015 [employer], [place]. This information is not intended to replace advice given to you by your health care provider. Make sure you discuss any questions you have with your health care provider. Conjunctivitis Conjunctivitis is commonly called "pink  eye." Conjunctivitis can be caused by bacterial or viral infection, allergies, or injuries. There is usually redness of the lining of the eye, itching, discomfort, and sometimes discharge. There may be deposits of matter along the eyelids. A viral infection usually causes a watery discharge, while a bacterial infection causes a yellowish, thick discharge. Pink eye is very contagious and spreads by direct contact. You may be given antibiotic eyedrops as part of your treatment. Before using your eye medicine, remove all drainage from the eye by washing gently with warm water and cotton balls. Continue to use the medication until you have awakened 2 mornings in a row without discharge from the eye. Do not rub your eye. This increases the irritation and helps spread infection. Use separate towels from other household members. Wash your hands with soap and water before and after touching your eyes. Use cold compresses to reduce pain and sunglasses to relieve irritation from light. Do not wear contact lenses or wear eye makeup until the infection is gone. SEEK MEDICAL CARE IF:   Your symptoms are not better after 3 days of treatment.  You have increased pain or trouble seeing.  The outer eyelids become very red or swollen. Document Released: 01/28/2004 Document Revised: 03/14/2011 Document Reviewed: 12/20/2004 [facility] Patient Information 2015 [place], [place]. This information is not intended to replace advice given to you by your health care provider. Make sure you discuss any questions you have with your health care provider.

## 2013-08-22 NOTE — Assessment & Plan Note (Signed)
Patient is complaining of worsening issues with fatigue at today.  Most likely, this is do to a chemotherapy; as well as chronic anorexia and dehydration secondary to his mucositis.  Patient is trying to remain as active as possible.

## 2013-08-22 NOTE — Assessment & Plan Note (Signed)
AST is increased from 37-58.  Will continue to monitor.

## 2013-08-22 NOTE — Assessment & Plan Note (Signed)
Chemotherapy-induced mucositis a does appear to be slightly improved since earlier this week when patient was initiated on oral Diflucan per Dr. [name].

## 2013-08-22 NOTE — Assessment & Plan Note (Signed)
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  He does experience quite a bit of chronic back pain; which he manages with oxycodone.

## 2013-08-22 NOTE — Progress Notes (Signed)
[place]   Chief Complaint  Patient presents with  . Dehydration  . Conjunctivitis    HPI: Fred [name] 43 y.o. male diagnosed with esophageal cancer.  He is currently undergoing docetaxel/cyramza chemotherapy regimen.  He received his last cycle of chemotherapy on 08/09/2013.  He will receive cycle 3 of the same chemotherapy on 08/30/2013.  Patient's wife called the cancer Center yesterday requested that patient received IV fluid rehydration today I do to a continued dehydration, increased weakness, and mucositis.  The patient did followup with Dr. [name] just this past Monday, 08/19/2013.  He was diagnosed with severe mucositis at that time; and prescribed liquid Diflucan.  It does appear that the patient's exercise issues are slowly resolving at this time.  Patient continues to complain of a minimal appetite and continued weight loss.  Also, patient has developed bilateral conjunctivitis of within the past 24 hours.  He states he has been experiencing purulent discharge and matting of his eyes within the past 24 hours.  He denies any vision changes whatsoever.  He also denies any recent fever or chills.  He does complain of worsening weakness and fatigue however.   Conjunctivitis     CURRENT THERAPY: Upcoming Treatment Dates - [facility] Docetaxel / Ramucirumab q21d Days with orders from any treatment category:  08/30/2013      SCHEDULING COMMUNICATION      acetaminophen (TYLENOL) tablet 650 mg      diphenhydrAMINE (BENADRYL) injection 50 mg      ondansetron (ZOFRAN) IVPB 8 mg      dexamethasone (DECADRON) injection 10 mg      ramucirumab (CYRAMZA) 930 mg in sodium chloride 0.9 % 157 mL chemo infusion      DOCEtaxel (TAXOTERE) 170 mg in dextrose 5 % 250 mL chemo infusion      sodium chloride 0.9 % injection 10 mL      heparin lock flush 100 unit/mL      heparin lock flush 100 unit/mL      alteplase (CATHFLO ACTIVASE) injection 2 mg      sodium chloride  0.9 % injection 3 mL      Cold Pack 1 packet      diphenhydrAMINE (BENADRYL) injection 25 mg      famotidine (PEPCID) IVPB 20 mg      0.9 %  sodium chloride infusion      methylPREDNISolone sodium succinate (SOLU-MEDROL) 125 mg/2 mL injection 125 mg      EPINEPHrine (ADRENALIN) 0.1 MG/ML injection 0.25 mg      EPINEPHrine (ADRENALIN) 0.1 MG/ML injection 0.25 mg      EPINEPHrine (ADRENALIN) injection 0.5 mg      EPINEPHrine (ADRENALIN) injection 0.5 mg      diphenhydrAMINE (BENADRYL) injection 50 mg      albuterol (PROVENTIL) (2.5 MG/3ML) 0.083% nebulizer solution 2.5 mg      0.9 %  sodium chloride infusion      TREATMENT CONDITIONS      TREATMENT CONDITIONS 08/31/2013      SCHEDULING COMMUNICATION INJECTION      pegfilgrastim (NEULASTA) injection 6 mg 09/20/2013      SCHEDULING COMMUNICATION      acetaminophen (TYLENOL) tablet 650 mg      diphenhydrAMINE (BENADRYL) injection 50 mg      ondansetron (ZOFRAN) IVPB 8 mg      dexamethasone (DECADRON) injection 10 mg      ramucirumab (CYRAMZA) 930 mg in sodium chloride 0.9 % 157 mL chemo  infusion      DOCEtaxel (TAXOTERE) 170 mg in dextrose 5 % 250 mL chemo infusion      sodium chloride 0.9 % injection 10 mL      heparin lock flush 100 unit/mL      heparin lock flush 100 unit/mL      alteplase (CATHFLO ACTIVASE) injection 2 mg      sodium chloride 0.9 % injection 3 mL      Cold Pack 1 packet      diphenhydrAMINE (BENADRYL) injection 25 mg      famotidine (PEPCID) IVPB 20 mg      0.9 %  sodium chloride infusion      methylPREDNISolone sodium succinate (SOLU-MEDROL) 125 mg/2 mL injection 125 mg      EPINEPHrine (ADRENALIN) 0.1 MG/ML injection 0.25 mg      EPINEPHrine (ADRENALIN) 0.1 MG/ML injection 0.25 mg      EPINEPHrine (ADRENALIN) injection 0.5 mg      EPINEPHrine (ADRENALIN) injection 0.5 mg      diphenhydrAMINE (BENADRYL) injection 50 mg      albuterol (PROVENTIL) (2.5 MG/3ML) 0.083% nebulizer solution 2.5 mg      0.9 %  sodium  chloride infusion      TREATMENT CONDITIONS      TREATMENT CONDITIONS    ROS  Past Medical History  Diagnosis Date  . GERD (gastroesophageal reflux disease)     cannot taste anything  . 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; Presumed lung metastases; Possible liver metastases; Back pain, thoracic; Esophageal cancer; Metastatic cancer to spine; Dehydration; Malignant pleural effusion; Accelerated hypertension; Hyponatremia; Hypoalbuminemia due to protein-calorie malnutrition; Dysphagia; Anorexia; Anemia, unspecified; Protein-calorie malnutrition, severe; Chest tightness; SOB (shortness of breath) on exertion; Sinus tachycardia; SOB (shortness of breath); Anemia; Conjunctivitis, acute, bilateral; Antineoplastic chemotherapy induced anemia(285.3); Fatigue; Neoplasm related pain (acute) (chronic); Mucositis (ulcerative) due to antineoplastic therapy; Weight loss; Hyperphosphatemia; and Transaminitis on his problem list.     has No Known Allergies.    Medication List       This list is accurate as of: 08/22/13  5:33 PM.  Always use your most recent med list.               erythromycin ophthalmic ointment  Place 1 application into both eyes 4 (four) times daily.     fluconazole 10 MG/ML suspension  Commonly known as:  DIFLUCAN  Take 10 mLs (100 mg total) by mouth daily. Take 200 mg on first day     gabapentin 100 MG capsule  Commonly known as:  NEURONTIN  Take 1 capsule (100 mg total) by mouth 3 (three) times daily.     lidocaine-prilocaine cream  Commonly known as:  EMLA  Apply 1 application topically as needed  (for port access (skin numbing)). Apply to port 1 hr before chemo     metoprolol tartrate 25 MG tablet  Commonly known as:  LOPRESSOR  Take 1 tablet (25 mg total) by mouth 2 (two) times daily.     morphine 30 MG 12 hr tablet  Commonly known as:  MS CONTIN  Take 1  tablet (30 mg total) by mouth every 12 (twelve) hours.     ondansetron 8 MG disintegrating tablet  Commonly known as:  ZOFRAN-ODT  Take 1 tablet (8 mg total) by mouth every 8 (eight) hours as needed for nausea or vomiting.     oxyCODONE 5 MG immediate release tablet  Commonly known as:  Oxy IR/ROXICODONE  Take 1-2 tablets (5-10 mg total) by mouth every 6 (six) hours as needed for severe pain.         PHYSICAL EXAMINATION  Vitals: BP 104/86, HR 121, temp 98.0, o2 sats 98%  Physical Exam  Nursing note and vitals reviewed. Constitutional: He is oriented to person, place, and time. He appears malnourished and dehydrated. He appears unhealthy.  HENT:  Head: Normocephalic and atraumatic.  Eyes: Pupils are equal, round, and reactive to light. Right eye exhibits discharge. Left eye exhibits discharge.  Bilateral eyes with purulent discharge and matting noted.  Neck: Normal range of motion. Neck supple.  Cardiovascular: Normal heart sounds.   Patient's heart rate today was 121.  Patient is tachycardic as baseline; and typically takes metoprolol to manage his tachycardia.  Pulmonary/Chest: Effort normal and breath sounds normal. No respiratory distress.  Abdominal: Soft. Bowel sounds are normal. He exhibits no distension and no mass. There is no tenderness. There is no guarding.  Musculoskeletal: Normal range of motion. He exhibits no edema and no tenderness.  Lymphadenopathy:    He has no cervical adenopathy.  Neurological: He is alert and oriented to person, place, and time.  Skin: Skin is warm and dry.  Psychiatric: Affect normal.    LABORATORY DATA:. CBC  Lab Results  Component Value Date   WBC 16.1* 08/22/2013    RBC 3.17* 08/22/2013   HGB 9.2* 08/22/2013   HCT 28.9* 08/22/2013   PLT 171 08/22/2013   MCV 91.2 08/22/2013   MCH 29.0 08/22/2013   MCHC 31.8* 08/22/2013   RDW 21.7* 08/22/2013   LYMPHSABS 0.8* 08/22/2013   MONOABS 1.1* 08/22/2013   EOSABS 0.0 08/22/2013   BASOSABS 0.1 08/22/2013     CMET  Lab Results  Component Value Date   NA 131* 08/22/2013   K 3.9 08/22/2013   CL 96 07/26/2013   CO2 25 08/22/2013   GLUCOSE 145* 08/22/2013   BUN 7.4 08/22/2013   CREATININE 0.7 08/22/2013   CALCIUM 8.1* 08/22/2013   PROT 5.7* 08/22/2013   ALBUMIN 2.2* 08/22/2013   AST 58* 08/22/2013   ALT 15 08/22/2013   ALKPHOS 176* 08/22/2013   BILITOT 1.14 08/22/2013   GFRNONAA >90 07/26/2013   GFRAA >90 07/26/2013   ASSESSMENT/PLAN:    Esophageal cancer  Assessment & Plan Patient is currently undergoing docetaxel/cyramza chemotherapy regimen.  Patient received cycle 2 of his chemotherapy on 08/09/2013.  Cycle 3 of the same chemotherapy will be due 08/30/2013.   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  He does experience quite a bit of chronic back pain; which he manages with oxycodone.   Dehydration  Assessment & Plan Patient is suffering with some fairly severe mucositis at this time; and has had minimal appetite or oral intake.  The patient did receive a 1 L normal saline IV fluid rehydration while at the [facility] today.  Also, patient has plans to return tomorrow to receive another liter of normal saline IV fluid rehydration prior to the weekend.   Hypoalbuminemia due to protein-calorie malnutrition  Assessment & Plan  Albumin continues to trend downward.  Abdomen is now at 2.2.  Patient was encouraged to push protein in the forms of either protein shakes or protein bars if at all possible.   Anorexia  Assessment & Plan Patient states he has minimal appetite at this time.  This is most likely multifactorial-due to both the  chemotherapy and worsening issues with mucositis.  Patient was encouraged to take in as many a small meals throughout the day as he could.   Conjunctivitis, acute, bilateral  Assessment & Plan Patient does have a bilateral acute conjunctivitis.  Patient was prescribed tobramycin ointment to use on a 4 times per day.  Also advised the patient to followup with his ophthalmologist if any of worsening eye symptoms or vision changes.  He carefully review all care instructions with both patient and his wife regarding conjunctivitis.  Patient was encouraged to exercise handwashing after touching his eyes.   Antineoplastic chemotherapy induced anemia(285.3)  Assessment & Plan Abdomen has decreased from 11.5-9.2.  Patient is complaining of some worsening issues with fatigue; but this is most likely multifactorial including a chemotherapy side effect.  Will continue to monitor closely.   Fatigue  Assessment & Plan Patient is complaining of worsening issues with fatigue at today.  Most likely, this is do to a chemotherapy; as well as chronic anorexia and dehydration secondary to his mucositis.  Patient is trying to remain as active as possible.   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  He does complain of chronic back pain.  He states that his oxycodone has been managing his pain fairly well this time.   Mucositis (ulcerative) due to antineoplastic therapy  Assessment & Plan Chemotherapy-induced mucositis a does appear to be slightly improved since earlier this week when patient was initiated on oral Diflucan per Dr. [name].   Weight loss  Assessment & Plan Patient continues to lose weight secondary to chemotherapy side effect and severe mucositis issues.  Patient was encouraged to push protein and taking as many small meals throughout the day as possible.   Hyperphosphatemia  Assessment &  Plan Alkaline phosphatase has increased from 122 up to 176.  Will continue to monitor closely.   Transaminitis  Assessment & Plan AST is increased from 37-58.  Will continue to monitor.   Patient stated understanding of all instructions; and was in agreement with this plan of care. The patient knows to call the clinic with any problems, questions or concerns.   Review/collaboration with Dr. Julien Nordmann regarding all aspects of patient's visit today.   Total time spent with patient was 25 minutes;  with greater than 90 percent of that time spent in face to face counseling regarding his symptoms, importance of washing hands the while dealing with conjunctivitis, and coordination of care and follow up.  Disclaimer: This note was dictated with voice recognition software. Similar sounding words can inadvertently be transcribed and may not be corrected upon review.   Drue Second, NP 08/22/2013

## 2013-08-22 NOTE — Assessment & Plan Note (Signed)
Patient continues to lose weight secondary to chemotherapy side effect and severe mucositis issues.  Patient was encouraged to push protein and taking as many small meals throughout the day as possible.

## 2013-08-22 NOTE — Assessment & Plan Note (Signed)
Albumin remains low at 2.2.  Patient was encouraged to push protein is much as possible.

## 2013-08-23 ENCOUNTER — Ambulatory Visit (HOSPITAL_BASED_OUTPATIENT_CLINIC_OR_DEPARTMENT_OTHER): Payer: BC Managed Care – PPO

## 2013-08-23 ENCOUNTER — Other Ambulatory Visit: Payer: Self-pay | Admitting: Medical Oncology

## 2013-08-23 VITALS — BP 108/86 | HR 142 | Temp 98.1°F | Resp 22

## 2013-08-23 DIAGNOSIS — C155 Malignant neoplasm of lower third of esophagus: Secondary | ICD-10-CM

## 2013-08-23 DIAGNOSIS — E860 Dehydration: Secondary | ICD-10-CM

## 2013-08-23 DIAGNOSIS — C787 Secondary malignant neoplasm of liver and intrahepatic bile duct: Secondary | ICD-10-CM

## 2013-08-23 MED ORDER — HEPARIN SOD (PORK) LOCK FLUSH 100 UNIT/ML IV SOLN
500.0000 [IU] | Freq: Once | INTRAVENOUS | Status: AC
  Administered 2013-08-23: 500 [IU] via INTRAVENOUS
  Filled 2013-08-23: qty 5

## 2013-08-23 MED ORDER — SODIUM CHLORIDE 0.9 % IV SOLN
Freq: Once | INTRAVENOUS | Status: AC
  Administered 2013-08-23: 13:00:00 via INTRAVENOUS

## 2013-08-23 MED ORDER — SODIUM CHLORIDE 0.9 % IJ SOLN
10.0000 mL | INTRAMUSCULAR | Status: DC | PRN
  Administered 2013-08-23: 10 mL via INTRAVENOUS
  Filled 2013-08-23: qty 10

## 2013-08-23 NOTE — Progress Notes (Signed)
Pain in back 7/10.  Pt does not want any additional pain medication at this time, he states he took his prn and scheduled narcotics at home.  Asked him to let us know if he does need any pain medication while at [facility].

## 2013-08-23 NOTE — Patient Instructions (Signed)
Thrush, Adult  Thrush, also called oral candidiasis, is a fungal infection that develops in the mouth and throat and on the tongue. It causes white patches to form on the mouth and tongue. Thrush is most common in older adults, but it can occur at any age.  Many cases of thrush are mild, but this infection can also be more serious. Thrush can be a recurring problem for people who have chronic illnesses or who take medicines that limit the body's ability to fight infection. Because these people have difficulty fighting infections, the fungus that causes thrush can spread throughout the body. This can cause life-threatening blood or organ infections. CAUSES  Thrush is usually caused by a yeast called Candida albicans. This fungus is normally present in small amounts in the mouth and on other mucous membranes. It usually causes no harm. However, when conditions are present that allow the fungus to grow uncontrolled, it invades surrounding tissues and becomes an infection. Less often, other Candida species can also lead to thrush.  RISK FACTORS Thrush is more likely to develop in the following people:  People with an impaired ability to fight infection (weakened immune system).   Older adults.   People with HIV.   People with diabetes.   People with dry mouth (xerostomia).   Pregnant women.   People with poor dental care, especially those who have false teeth.   People who use antibiotic medicines.  SIGNS AND SYMPTOMS  Thrush can be a mild infection that causes no symptoms. If symptoms develop, they may include:   A burning feeling in the mouth and throat. This can occur at the start of a thrush infection.   White patches that adhere to the mouth and tongue. The tissue around the patches may be red, raw, and painful. If rubbed (during tooth brushing, for example), the patches and the tissue of the mouth may bleed easily.   A bad taste in the mouth or difficulty tasting foods.    Cottony feeling in the mouth.   Pain during eating and swallowing. DIAGNOSIS  Your health care provider can usually diagnose thrush by looking in your mouth and asking you questions about your health.  TREATMENT  Medicines that help prevent the growth of fungi (antifungals) are the standard treatment for thrush. These medicines are either applied directly to the affected area (topical) or swallowed (oral). The treatment will depend on the severity of the condition.  Mild Thrush Mild cases of thrush may clear up with the use of an antifungal mouth rinse or lozenges. Treatment usually lasts about 14 days.  Moderate to Severe Thrush  More severe thrush infections that have spread to the esophagus are treated with an oral antifungal medicine. A topical antifungal medicine may also be used.   For some severe infections, a treatment period longer than 14 days may be needed.   Oral antifungal medicines are almost never used during pregnancy because the fetus may be harmed. However, if a pregnant woman has a rare, severe thrush infection that has spread to her blood, oral antifungal medicines may be used. In this case, the risk of harm to the mother and fetus from the severe thrush infection may be greater than the risk posed by the use of antifungal medicines.  Persistent or Recurrent Thrush For cases of thrush that do not go away or keep coming back, treatment may involve the following:   Treatment may be needed twice as long as the symptoms last.   Treatment will   include both oral and topical antifungal medicines.   People with weakened immune systems can take an antifungal medicine on a continuous basis to prevent thrush infections.  It is important to treat conditions that make you more likely to get thrush, such as diabetes or HIV.  HOME CARE INSTRUCTIONS   Only take over-the-counter or prescription medicine as directed by your health care provider. Talk to your health care  provider about an over-the-counter medicine called gentian violet, which kills bacteria and fungi.   Eat plain, unflavored yogurt as directed by your health care provider. Check the label to make sure the yogurt contains live cultures. This yogurt can help healthy bacteria grow in the mouth that can stop the growth of the fungus that causes thrush.   Try these measures to help reduce the discomfort of thrush:   Drink cold liquids such as water or iced tea.   Try flavored ice treats or frozen juices.   Eat foods that are easy to swallow, such as gelatin, ice cream, or custard.   If the patches in your mouth are painful, try drinking from a straw.   Rinse your mouth several times a day with a warm saltwater rinse. You can make the saltwater mixture with 1 tsp (6 g) of salt in 8 fl oz (0.2 L) of warm water.   If you wear dentures, remove the dentures before going to bed, brush them vigorously, and soak them in a cleaning solution as directed by your health care provider.   Women who are breastfeeding should clean their nipples with an antifungal medicine as directed by their health care provider. Dry the nipples after breastfeeding. Applying lanolin-containing body lotion may help relieve nipple soreness.  SEEK MEDICAL CARE IF:  Your symptoms are getting worse or are not improving within 7 days of starting treatment.   You have symptoms of spreading infection, such as white patches on the skin outside of the mouth.   You are nursing and you have redness, burning, or pain in the nipples that is not relieved with treatment.  MAKE SURE YOU:  Understand these instructions.  Will watch your condition.  Will get help right away if you are not doing well or get worse. Document Released: 09/15/2003 Document Revised: 10/10/2012 Document Reviewed: 07/23/2012 [facility] Patient Information 2015 [place], [place]. This information is not intended to replace advice given to you by your  health care provider. Make sure you discuss any questions you have with your health care provider. Dehydration, Adult Dehydration is when you lose more fluids from the body than you take in. Vital organs like the kidneys, brain, and heart cannot function without a proper amount of fluids and salt. Any loss of fluids from the body can cause dehydration.  CAUSES   Vomiting.  Diarrhea.  Excessive sweating.  Excessive urine output.  Fever. SYMPTOMS  Mild dehydration  Thirst.  Dry lips.  Slightly dry mouth. Moderate dehydration  Very dry mouth.  Sunken eyes.  Skin does not bounce back quickly when lightly pinched and released.  Dark urine and decreased urine production.  Decreased tear production.  Headache. Severe dehydration  Very dry mouth.  Extreme thirst.  Rapid, weak pulse (more than 100 beats per minute at rest).  Cold hands and feet.  Not able to sweat in spite of heat and temperature.  Rapid breathing.  Blue lips.  Confusion and lethargy.  Difficulty being awakened.  Minimal urine production.  No tears. DIAGNOSIS  Your caregiver will diagnose dehydration  based on your symptoms and your exam. Blood and urine tests will help confirm the diagnosis. The diagnostic evaluation should also identify the cause of dehydration. TREATMENT  Treatment of mild or moderate dehydration can often be done at home by increasing the amount of fluids that you drink. It is best to drink small amounts of fluid more often. Drinking too much at one time can make vomiting worse. Refer to the home care instructions below. Severe dehydration needs to be treated at the hospital where you will probably be given intravenous (IV) fluids that contain water and electrolytes. HOME CARE INSTRUCTIONS   Ask your caregiver about specific rehydration instructions.  Drink enough fluids to keep your urine clear or pale yellow.  Drink small amounts frequently if you have nausea and  vomiting.  Eat as you normally do.  Avoid:  Foods or drinks high in sugar.  Carbonated drinks.  Juice.  Extremely hot or cold fluids.  Drinks with caffeine.  Fatty, greasy foods.  Alcohol.  Tobacco.  Overeating.  Gelatin desserts.  Wash your hands well to avoid spreading bacteria and viruses.  Only take over-the-counter or prescription medicines for pain, discomfort, or fever as directed by your caregiver.  Ask your caregiver if you should continue all prescribed and over-the-counter medicines.  Keep all follow-up appointments with your caregiver. SEEK MEDICAL CARE IF:  You have abdominal pain and it increases or stays in one area (localizes).  You have a rash, stiff neck, or severe headache.  You are irritable, sleepy, or difficult to awaken.  You are weak, dizzy, or extremely thirsty. SEEK IMMEDIATE MEDICAL CARE IF:   You are unable to keep fluids down or you get worse despite treatment.  You have frequent episodes of vomiting or diarrhea.  You have blood or green matter (bile) in your vomit.  You have blood in your stool or your stool looks black and tarry.  You have not urinated in 6 to 8 hours, or you have only urinated a small amount of very dark urine.  You have a fever.  You faint. MAKE SURE YOU:   Understand these instructions.  Will watch your condition.  Will get help right away if you are not doing well or get worse. Document Released: 12/20/2004 Document Revised: 03/14/2011 Document Reviewed: 08/09/2010 [facility] Patient Information 2015 [place], [place]. This information is not intended to replace advice given to you by your health care provider. Make sure you discuss any questions you have with your health care provider.

## 2013-08-26 ENCOUNTER — Telehealth: Payer: Self-pay | Admitting: *Deleted

## 2013-08-27 ENCOUNTER — Emergency Department (HOSPITAL_COMMUNITY): Payer: BC Managed Care – PPO

## 2013-08-27 ENCOUNTER — Observation Stay (HOSPITAL_COMMUNITY)
Admission: EM | Admit: 2013-08-27 | Discharge: 2013-08-30 | Disposition: A | Discharge status: Home / Self Care | Payer: BC Managed Care – PPO | Attending: Internal Medicine | Admitting: Internal Medicine

## 2013-08-27 ENCOUNTER — Encounter (HOSPITAL_COMMUNITY): Payer: Self-pay | Admitting: Emergency Medicine

## 2013-08-27 DIAGNOSIS — H1030 Unspecified acute conjunctivitis, unspecified eye: Secondary | ICD-10-CM | POA: Insufficient documentation

## 2013-08-27 DIAGNOSIS — J910 Malignant pleural effusion: Secondary | ICD-10-CM | POA: Diagnosis not present

## 2013-08-27 DIAGNOSIS — I10 Essential (primary) hypertension: Secondary | ICD-10-CM | POA: Diagnosis not present

## 2013-08-27 DIAGNOSIS — R0602 Shortness of breath: Secondary | ICD-10-CM | POA: Diagnosis not present

## 2013-08-27 DIAGNOSIS — T451X5A Adverse effect of antineoplastic and immunosuppressive drugs, initial encounter: Secondary | ICD-10-CM | POA: Insufficient documentation

## 2013-08-27 DIAGNOSIS — D6481 Anemia due to antineoplastic chemotherapy: Secondary | ICD-10-CM | POA: Diagnosis not present

## 2013-08-27 DIAGNOSIS — I498 Other specified cardiac arrhythmias: Secondary | ICD-10-CM | POA: Insufficient documentation

## 2013-08-27 DIAGNOSIS — Z7189 Other specified counseling: Secondary | ICD-10-CM

## 2013-08-27 DIAGNOSIS — Z515 Encounter for palliative care: Secondary | ICD-10-CM

## 2013-08-27 DIAGNOSIS — R630 Anorexia: Secondary | ICD-10-CM | POA: Diagnosis not present

## 2013-08-27 DIAGNOSIS — R531 Weakness: Secondary | ICD-10-CM

## 2013-08-27 DIAGNOSIS — R000 Tachycardia, unspecified: Secondary | ICD-10-CM

## 2013-08-27 DIAGNOSIS — R5383 Other fatigue: Secondary | ICD-10-CM | POA: Diagnosis not present

## 2013-08-27 DIAGNOSIS — R7401 Elevation of levels of liver transaminase levels: Secondary | ICD-10-CM | POA: Diagnosis not present

## 2013-08-27 DIAGNOSIS — R740 Nonspecific elevation of levels of transaminase and lactic acid dehydrogenase [LDH]: Secondary | ICD-10-CM

## 2013-08-27 DIAGNOSIS — C7800 Secondary malignant neoplasm of unspecified lung: Secondary | ICD-10-CM | POA: Diagnosis not present

## 2013-08-27 DIAGNOSIS — R627 Adult failure to thrive: Secondary | ICD-10-CM | POA: Diagnosis not present

## 2013-08-27 DIAGNOSIS — Z79899 Other long term (current) drug therapy: Secondary | ICD-10-CM | POA: Insufficient documentation

## 2013-08-27 DIAGNOSIS — K1231 Oral mucositis (ulcerative) due to antineoplastic therapy: Secondary | ICD-10-CM | POA: Diagnosis not present

## 2013-08-27 DIAGNOSIS — R1310 Dysphagia, unspecified: Secondary | ICD-10-CM

## 2013-08-27 DIAGNOSIS — K219 Gastro-esophageal reflux disease without esophagitis: Secondary | ICD-10-CM | POA: Diagnosis not present

## 2013-08-27 DIAGNOSIS — G893 Neoplasm related pain (acute) (chronic): Secondary | ICD-10-CM

## 2013-08-27 DIAGNOSIS — E43 Unspecified severe protein-calorie malnutrition: Secondary | ICD-10-CM | POA: Diagnosis not present

## 2013-08-27 DIAGNOSIS — R5381 Other malaise: Principal | ICD-10-CM | POA: Insufficient documentation

## 2013-08-27 DIAGNOSIS — C7951 Secondary malignant neoplasm of bone: Secondary | ICD-10-CM

## 2013-08-27 DIAGNOSIS — R634 Abnormal weight loss: Secondary | ICD-10-CM

## 2013-08-27 DIAGNOSIS — C7949 Secondary malignant neoplasm of other parts of nervous system: Secondary | ICD-10-CM | POA: Diagnosis not present

## 2013-08-27 DIAGNOSIS — C7931 Secondary malignant neoplasm of brain: Secondary | ICD-10-CM

## 2013-08-27 DIAGNOSIS — E871 Hypo-osmolality and hyponatremia: Secondary | ICD-10-CM

## 2013-08-27 DIAGNOSIS — E46 Unspecified protein-calorie malnutrition: Secondary | ICD-10-CM

## 2013-08-27 DIAGNOSIS — C7952 Secondary malignant neoplasm of bone marrow: Secondary | ICD-10-CM

## 2013-08-27 DIAGNOSIS — R530 Neoplastic (malignant) related fatigue: Secondary | ICD-10-CM

## 2013-08-27 DIAGNOSIS — E860 Dehydration: Secondary | ICD-10-CM

## 2013-08-27 DIAGNOSIS — C159 Malignant neoplasm of esophagus, unspecified: Secondary | ICD-10-CM | POA: Diagnosis present

## 2013-08-27 DIAGNOSIS — H1033 Unspecified acute conjunctivitis, bilateral: Secondary | ICD-10-CM | POA: Diagnosis present

## 2013-08-27 DIAGNOSIS — D649 Anemia, unspecified: Secondary | ICD-10-CM

## 2013-08-27 DIAGNOSIS — R7402 Elevation of levels of lactic acid dehydrogenase (LDH): Secondary | ICD-10-CM | POA: Insufficient documentation

## 2013-08-27 DIAGNOSIS — E8809 Other disorders of plasma-protein metabolism, not elsewhere classified: Secondary | ICD-10-CM

## 2013-08-27 DIAGNOSIS — R0789 Other chest pain: Secondary | ICD-10-CM | POA: Diagnosis not present

## 2013-08-27 DIAGNOSIS — C787 Secondary malignant neoplasm of liver and intrahepatic bile duct: Secondary | ICD-10-CM

## 2013-08-27 LAB — COMPREHENSIVE METABOLIC PANEL
ALK PHOS: 130 U/L — AB (ref 39–117)
ALT: 14 U/L (ref 0–53)
AST: 46 U/L — ABNORMAL HIGH (ref 0–37)
Albumin: 1.9 g/dL — ABNORMAL LOW (ref 3.5–5.2)
Anion gap: 12 (ref 5–15)
BUN: 7 mg/dL (ref 6–23)
CALCIUM: 8.1 mg/dL — AB (ref 8.4–10.5)
CO2: 25 mEq/L (ref 19–32)
Chloride: 96 mEq/L (ref 96–112)
Creatinine, Ser: 0.67 mg/dL (ref 0.50–1.35)
GFR calc non Af Amer: >90 mL/min (ref >90)
GLUCOSE: 116 mg/dL — AB (ref 70–99)
POTASSIUM: 4.3 meq/L (ref 3.7–5.3)
Sodium: 133 mEq/L — ABNORMAL LOW (ref 137–147)
Total Bilirubin: 1 mg/dL (ref 0.3–1.2)
Total Protein: 5.5 g/dL — ABNORMAL LOW (ref 6.0–8.3)

## 2013-08-27 LAB — CBC WITH DIFFERENTIAL/PLATELET
BASOS ABS: 0 10*3/uL (ref 0.0–0.1)
Basophils Relative: 0 % (ref 0–1)
EOS ABS: 0 10*3/uL (ref 0.0–0.7)
Eosinophils Relative: 0 % (ref 0–5)
HEMATOCRIT: 25.8 % — AB (ref 39.0–52.0)
Hemoglobin: 8.3 g/dL — ABNORMAL LOW (ref 13.0–17.0)
LYMPHS PCT: 9 % — AB (ref 12–46)
Lymphs Abs: 1.2 10*3/uL (ref 0.7–4.0)
MCH: 29.9 pg (ref 26.0–34.0)
MCHC: 32.2 g/dL (ref 30.0–36.0)
MCV: 92.8 fL (ref 78.0–100.0)
MONOS PCT: 12 % (ref 3–12)
Monocytes Absolute: 1.6 10*3/uL — ABNORMAL HIGH (ref 0.1–1.0)
Neutro Abs: 10.4 10*3/uL — ABNORMAL HIGH (ref 1.7–7.7)
Neutrophils Relative %: 79 % — ABNORMAL HIGH (ref 43–77)
PLATELETS: 222 10*3/uL (ref 150–400)
RBC: 2.78 MIL/uL — ABNORMAL LOW (ref 4.22–5.81)
RDW: 21.8 % — ABNORMAL HIGH (ref 11.5–15.5)
WBC: 13.2 10*3/uL — AB (ref 4.0–10.5)

## 2013-08-27 LAB — URINALYSIS, ROUTINE W REFLEX MICROSCOPIC
Glucose, UA: NEGATIVE mg/dL
Hgb urine dipstick: NEGATIVE
KETONES UR: NEGATIVE mg/dL
NITRITE: NEGATIVE
PH: 6 (ref 5.0–8.0)
Protein, ur: NEGATIVE mg/dL
Specific Gravity, Urine: 1.037 — ABNORMAL HIGH (ref 1.005–1.030)
Urobilinogen, UA: 1 mg/dL (ref 0.0–1.0)

## 2013-08-27 LAB — URINE MICROSCOPIC-ADD ON

## 2013-08-27 LAB — TROPONIN I

## 2013-08-27 LAB — I-STAT CHEM 8, ED
BUN: 5 mg/dL — ABNORMAL LOW (ref 6–23)
CHLORIDE: 96 meq/L (ref 96–112)
CREATININE: 0.7 mg/dL (ref 0.50–1.35)
Calcium, Ion: 1.1 mmol/L — ABNORMAL LOW (ref 1.12–1.23)
GLUCOSE: 111 mg/dL — AB (ref 70–99)
HCT: 31 % — ABNORMAL LOW (ref 39.0–52.0)
Hemoglobin: 10.5 g/dL — ABNORMAL LOW (ref 13.0–17.0)
Potassium: 4.2 mEq/L (ref 3.7–5.3)
Sodium: 132 mEq/L — ABNORMAL LOW (ref 137–147)
TCO2: 25 mmol/L (ref 0–100)

## 2013-08-27 LAB — CBC
HCT: 24.6 % — ABNORMAL LOW (ref 39.0–52.0)
HEMOGLOBIN: 7.9 g/dL — AB (ref 13.0–17.0)
MCH: 30.2 pg (ref 26.0–34.0)
MCHC: 32.1 g/dL (ref 30.0–36.0)
MCV: 93.9 fL (ref 78.0–100.0)
Platelets: 200 10*3/uL (ref 150–400)
RBC: 2.62 MIL/uL — AB (ref 4.22–5.81)
RDW: 21.9 % — ABNORMAL HIGH (ref 11.5–15.5)
WBC: 11 10*3/uL — ABNORMAL HIGH (ref 4.0–10.5)

## 2013-08-27 LAB — PHOSPHORUS: PHOSPHORUS: 4.2 mg/dL (ref 2.3–4.6)

## 2013-08-27 LAB — MAGNESIUM: Magnesium: 1.5 mg/dL (ref 1.5–2.5)

## 2013-08-27 LAB — CREATININE, SERUM
Creatinine, Ser: 0.64 mg/dL (ref 0.50–1.35)
GFR calc Af Amer: >90 mL/min (ref >90)
GFR calc non Af Amer: >90 mL/min (ref >90)

## 2013-08-27 LAB — I-STAT CG4 LACTIC ACID, ED: Lactic Acid, Venous: 2.08 mmol/L (ref 0.5–2.2)

## 2013-08-27 MED ORDER — LIDOCAINE-PRILOCAINE 2.5-2.5 % EX CREA: 1.0000 "application " | TOPICAL_CREAM | CUTANEOUS | Status: DC | PRN

## 2013-08-27 MED ORDER — SODIUM CHLORIDE 0.9 % IV SOLN: 250.0000 mL | INTRAVENOUS | Status: DC | PRN

## 2013-08-27 MED ORDER — ERYTHROMYCIN 5 MG/GM OP OINT
1.0000 "application " | TOPICAL_OINTMENT | Freq: Four times a day (QID) | OPHTHALMIC | Status: DC
  Administered 2013-08-27 – 2013-08-30 (×10): 1 via OPHTHALMIC
  Filled 2013-08-27: qty 3.5

## 2013-08-27 MED ORDER — MORPHINE SULFATE 2 MG/ML IJ SOLN
1.0000 mg | INTRAMUSCULAR | Status: DC | PRN
  Administered 2013-08-27 – 2013-08-30 (×6): 1 mg via INTRAVENOUS
  Filled 2013-08-27 (×7): qty 1

## 2013-08-27 MED ORDER — GABAPENTIN 100 MG PO CAPS
100.0000 mg | ORAL_CAPSULE | Freq: Three times a day (TID) | ORAL | Status: DC
  Administered 2013-08-27 – 2013-08-30 (×8): 100 mg via ORAL
  Filled 2013-08-27 (×10): qty 1

## 2013-08-27 MED ORDER — ONDANSETRON 8 MG PO TBDP: 8.0000 mg | ORAL_TABLET | Freq: Three times a day (TID) | ORAL | Status: DC | PRN

## 2013-08-27 MED ORDER — FLUCONAZOLE 40 MG/ML PO SUSR
100.0000 mg | Freq: Every day | ORAL | Status: DC
  Administered 2013-08-27 – 2013-08-30 (×4): 100 mg via ORAL
  Filled 2013-08-27: qty 2.5
  Filled 2013-08-27: qty 10
  Filled 2013-08-27 (×3): qty 2.5

## 2013-08-27 MED ORDER — IOHEXOL 300 MG/ML  SOLN
100.0000 mL | Freq: Once | INTRAMUSCULAR | Status: AC | PRN
  Administered 2013-08-27: 100 mL via INTRAVENOUS

## 2013-08-27 MED ORDER — MORPHINE SULFATE ER 30 MG PO TBCR
30.0000 mg | EXTENDED_RELEASE_TABLET | Freq: Two times a day (BID) | ORAL | Status: DC
  Administered 2013-08-27 – 2013-08-30 (×6): 30 mg via ORAL
  Filled 2013-08-27 (×6): qty 1

## 2013-08-27 MED ORDER — HEPARIN SODIUM (PORCINE) 5000 UNIT/ML IJ SOLN
5000.0000 [IU] | Freq: Three times a day (TID) | INTRAMUSCULAR | Status: DC
  Administered 2013-08-27 – 2013-08-28 (×2): 5000 [IU] via SUBCUTANEOUS
  Filled 2013-08-27 (×5): qty 1

## 2013-08-27 MED ORDER — SIMETHICONE 80 MG PO CHEW
160.0000 mg | CHEWABLE_TABLET | Freq: Four times a day (QID) | ORAL | Status: DC | PRN
  Filled 2013-08-27: qty 2

## 2013-08-27 MED ORDER — OXYCODONE HCL 5 MG PO TABS: 5.0000 mg | ORAL_TABLET | Freq: Four times a day (QID) | ORAL | Status: DC | PRN

## 2013-08-27 MED ORDER — SODIUM CHLORIDE 0.9 % IJ SOLN: 3.0000 mL | INTRAMUSCULAR | Status: DC | PRN

## 2013-08-27 MED ORDER — METOPROLOL TARTRATE 25 MG PO TABS
25.0000 mg | ORAL_TABLET | Freq: Two times a day (BID) | ORAL | Status: DC
  Administered 2013-08-28 – 2013-08-30 (×5): 25 mg via ORAL
  Filled 2013-08-27 (×6): qty 1

## 2013-08-27 MED ORDER — SODIUM CHLORIDE 0.9 % IJ SOLN: 3.0000 mL | Freq: Two times a day (BID) | INTRAMUSCULAR | Status: DC

## 2013-08-27 NOTE — ED Notes (Signed)
Attempted to call report, RN will call back shortly

## 2013-08-27 NOTE — H&P (Signed)
[facility] History and Physical  Fred [name] ACZ:[id number] DOB: 1970/10/02 DOA: 08/27/2013  Referring physician: Dr. Betsey Holiday PCP: [name], MD  Dr. Roxan Hockey : Cardiothoracic surgeon  Chief Complaint: weakness increasing fatigue  HPI: Fred [name] is a 43 y.o. male  With history of esophageal carcinoma with metastases with chemotherapy 2 weeks ago, hypertension, bilateral conjunctivitis, recent Pleurx catheter placement. Who presents today because of the above complaints. Reportedly this has progressively gotten worse in the last 2 or 3 days. Nothing family is aware of makes it better or worse. Problem has been getting worse since onset. Given persistent weakness family decided to bring patient to the ED for further evaluation.  Case with was discussed with the oncologist. He reports and reportedly after imaging study it was decided that patient should pursue palliative care given his progression of his carcinoma despite chemotherapy.   Review of Systems:  Constitutional:  + weight loss, night sweats, Fevers, chills, + fatigue.  HEENT:  No headaches, Difficulty swallowing,Tooth/dental problems,Sore throat,  No sneezing, itching, ear ache, nasal congestion, post nasal drip,  Cardio-vascular:  No chest pain, Orthopnea, PND, + swelling in lower extremities, anasarca, dizziness, palpitations  GI:  No heartburn, indigestion, abdominal pain,+ nausea, vomiting, diarrhea, change in bowel habits, + loss of appetite  Resp:  + shortness of breath with exertion or at rest. No excess mucus, no productive cough, No non-productive cough, No coughing up of blood.No change in color of mucus.No wheezing.No chest wall deformity  Skin:  no rash or lesions.  GU:  no dysuria, change in color of urine, no urgency or frequency. No flank pain.  Musculoskeletal:  No joint pain or swelling. No decreased range of motion. No back pain.  Psych:  No change in mood or affect. No depression or  anxiety. No memory loss.   Past Medical History  Diagnosis Date  . GERD (gastroesophageal reflux disease)     cannot taste anything  . 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 Chest tube insertion Right 07/26/2013    Procedure: INSERTION PLEURAL DRAINAGE CATHETER;  Surgeon: Melrose Nakayama, MD;  Location: [facility];  Service: Thoracic;  Laterality: Right;   Social History:  reports that he has never smoked. He has quit using smokeless tobacco. His smokeless tobacco use included Chew. He reports that he drinks alcohol. He reports that he does not use illicit drugs.  No Known Allergies  History reviewed. No pertinent family history.   Prior to Admission medications   Medication Sig Start Date End Date Taking? Authorizing Provider  erythromycin ophthalmic ointment Place 1 application into both eyes 4 (four) times daily. 08/22/13  Yes Drue Second, NP  fluconazole (DIFLUCAN) 10 MG/ML suspension Take 10 mLs (100 mg total) by mouth daily. Take 200 mg on first day 08/19/13  Yes Lora Paula, MD  gabapentin (NEURONTIN) 100 MG capsule Take 1 capsule (100 mg total) by mouth 3 (three) times daily. 04/29/13  Yes Curt Bears, MD  lidocaine-prilocaine (EMLA) cream Apply 1 application topically as needed (for port access (skin numbing)). Apply to port 1 hr before chemo 03/13/13  Yes Curt Bears, MD  metoprolol tartrate (LOPRESSOR) 25 MG tablet Take 1 tablet (25 mg total) by mouth 2 (two) times daily. 07/14/13  Yes Verlee Monte, MD  morphine (MS CONTIN) 30 MG 12 hr tablet Take 1 tablet (30 mg total)  by mouth every 12 (twelve) hours. 08/09/13  Yes Adrena E Johnson, PA-C  ondansetron (ZOFRAN-ODT) 8 MG disintegrating tablet Take 1 tablet (8 mg total) by mouth every 8 (eight) hours as needed for nausea or vomiting. 08/09/13  Yes Carlton Adam, PA-C  oxyCODONE (OXY IR/ROXICODONE) 5  MG immediate release tablet Take 1-2 tablets (5-10 mg total) by mouth every 6 (six) hours as needed for severe pain. 08/19/13  Yes Lora Paula, MD  simethicone (MYLICON) 80 MG chewable tablet Chew 160 mg by mouth 4 (four) times daily as needed for flatulence.   Yes Historical Provider, MD   Physical Exam: Filed Vitals:   08/27/13 1415 08/27/13 1430 08/27/13 1500 08/27/13 1630  BP: 101/65 102/76 105/64   Pulse:  130 122 122  Temp:    99.4 F (37.4 C)  TempSrc:    Oral  Resp: 19 21 18 21   SpO2:  93% 96% 94%    Wt Readings from Last 3 Encounters:  08/19/13 88.134 kg (194 lb 4.8 oz)  08/09/13 92.171 kg (203 lb 3.2 oz)  08/06/13 98.884 kg (218 lb)    General:  Appears calm and comfortable Eyes: PERRL, normal lids, irises & conjunctiva ENT: grossly normal hearing, lips & tongue Neck: no LAD, masses or thyromegaly Cardiovascular: RRR, no m/r/g. No LE edema. Telemetry: SR, no arrhythmias  Respiratory: CTA bilaterally, no w/r/r. Normal respiratory effort. Abdomen: soft, ntnd Skin: no rash or induration seen on limited exam Musculoskeletal: grossly normal tone BUE/BLE Psychiatric: grossly normal mood and affect, speech fluent and appropriate Neurologic: grossly non-focal.          Labs on Admission:  Basic Metabolic Panel:  Recent Labs Lab 08/22/13 1556 08/27/13 1205 08/27/13 1221  NA 131* 133* 132*  K 3.9 4.3 4.2  CL  --  96 96  CO2 25 25  --   GLUCOSE 145* 116* 111*  BUN 7.4 7 5*  CREATININE 0.7 0.67 0.70  CALCIUM 8.1* 8.1*  --    Liver Function Tests:  Recent Labs Lab 08/22/13 1556 08/27/13 1205  AST 58* 46*  ALT 15 14  ALKPHOS 176* 130*  BILITOT 1.14 1.0  PROT 5.7* 5.5*  ALBUMIN 2.2* 1.9*   No results found for this basename: LIPASE, AMYLASE,  in the last 168 hours No results found for this basename: AMMONIA,  in the last 168 hours CBC:  Recent Labs Lab 08/22/13 1556 08/27/13 1205 08/27/13 1221  WBC 16.1* 13.2*  --   NEUTROABS 14.0* 10.4*  --    HGB 9.2* 8.3* 10.5*  HCT 28.9* 25.8* 31.0*  MCV 91.2 92.8  --   PLT 171 222  --    Cardiac Enzymes:  Recent Labs Lab 08/27/13 1205  TROPONINI <0.30    BNP (last 3 results)  Recent Labs  07/11/13 1830 07/22/13 1159  PROBNP 111.5 174.1*   CBG: No results found for this basename: GLUCAP,  in the last 168 hours  Radiological Exams on Admission: Ct Chest W Contrast  08/27/2013   CLINICAL DATA:  43 year old with widespread metastatic esophageal cancer originally diagnosed in January, 2015, presenting with generalized weakness which has worsened significantly over the past few days. CT requested to evaluate for progression of disease and possible need for referral to hospice.  EXAM: CT CHEST, ABDOMEN, AND PELVIS WITH CONTRAST  TECHNIQUE: Multidetector CT imaging of the chest, abdomen and pelvis was performed following the standard protocol during bolus administration of intravenous contrast.  CONTRAST:  152mL OMNIPAQUE IOHEXOL 300  MG/ML IV. Oral contrast was not administered at the request of the emergency department position.  COMPARISON:  CT chest abdomen and pelvis 07/11/2013. PET-CT 02/07/2013.  FINDINGS: CT CHEST FINDINGS  Since the prior examination 07/11/2013, marked progression of what is now extensive pleural metastatic disease on the right, associated with a large loculated right pleural effusion with PleurX drainage catheter in place. Large left pleural effusion with mild pleural metastatic disease on the left. Interval progression of posterior mediastinal lymphadenopathy which begins just below the carina and extends to the diaphragm, approximate measurements of 8.5 x 5.8 x 10.8 cm (series 2, image 34 and coronal image 56). Enlarged left low paratracheal lymph node approximating 2.2 cm, unchanged.  Interval development of interstitial opacities in both lungs, left greater than right. Dense passive atelectasis in the lower lobes related to the large pleural effusions. No discrete  pulmonary parenchymal nodules or masses.  Heart size upper normal. No pericardial effusion. Right pericardiac low mediastinal lymph nodes, unchanged. Right jugular Port-A-Cath tip at the cavoatrial junction. Mild left gynecomastia, unchanged.  Bone window images again demonstrate the pathologic compression fracture of T9 with prior T8 through T10 fusion, the mixed lytic and sclerotic metastasis involving the lower endplate of T6, the sclerotic metastasis involving the anterior right 1st rib, and the sclerotic metastasis in the body of the right scapula.  CT ABDOMEN AND PELVIS FINDINGS  Stable metastases in the anterior segment right lobe of liver, the more superior of which near the dome measures approximately 2.3 x 3.4 cm (image 38). No new hepatic metastases. Mild diffuse hepatic steatosis. Patent portal vein. Echogenic bile within the mildly distended gallbladder. No calcified gallstones. No biliary ductal dilation. Spleen normal in size without evidence of metastatic disease. Focus of accessory splenic tissue anterior to the spleen just below the hilum. Normal adrenal glands.  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 Non obstructing 5 mm calculi in adjacent calices of the lower pole of the right kidney. Normal-appearing left kidney. No visible aortoiliofemoral atherosclerosis.  Interval marked progression of peritoneal and omental metastatic disease throughout the upper abdomen. Conglomerate disease adjacent to the splenic hilum measures approximately 4.5 x 3.2 cm (image 51). No associated ascites.  Interval increase in size of the mass in the right side of the retroperitoneum in the retrocaval region, just below the right renal artery, current measurements approximating 4.1 x 3.5 cm (image 60). Interval increase in size of the mass in the left side of the retroperitoneum at the same level, current measurements approximately 3.0 x 1.7 cm (image 59). Duplication of the IVC again  noted.  Stomach decompressed and unremarkable. Normal-appearing small bowel. Large stool burden in the rib rectosigmoid colon. No visible colonic masses. Normal appendix in the right upper pelvis.  Urinary bladder unremarkable. Prostate gland and seminal vesicles normal for age. Numerous pelvic phleboliths. Small right inguinal hernia containing fat.  Bone window images again demonstrate the mixed lytic and sclerotic metastasis involving the L3 vertebral body, the sclerotic metastases involving both iliac bones, the largest on the left and progressive periosteal new bone formation involving the left iliac bone, the sclerotic metastases involving the left 1st and 2nd sacral ala.  IMPRESSION: 1. Significant progression of metastatic disease in the chest and abdomen as detailed above, with marked progression of pleural metastatic disease in the right hemithorax and progression of peritoneal and omental metastatic disease in the upper abdomen in particular. 2. Large bilateral pleural effusions with passive atelectasis in the lower lobes. 3.  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 4. Mildly distended gallbladder containing echogenic bile, likely sludge. No calcified gallstones. 5. Stable widespread osseous metastatic disease.   Electronically Signed   By: Evangeline Dakin M.D.   On: 08/27/2013 16:11   Ct Abdomen Pelvis W Contrast  08/27/2013   CLINICAL DATA:  43 year old with widespread metastatic esophageal cancer originally diagnosed in January, 2015, presenting with generalized weakness which has worsened significantly over the past few days. CT requested to evaluate for progression of disease and possible need for referral to hospice.  EXAM: CT CHEST, ABDOMEN, AND PELVIS WITH CONTRAST  TECHNIQUE: Multidetector CT imaging of the chest, abdomen and pelvis was performed following the standard protocol during bolus administration of intravenous contrast.   CONTRAST:  141mL OMNIPAQUE IOHEXOL 300 MG/ML IV. Oral contrast was not administered at the request of the emergency department position.  COMPARISON:  CT chest abdomen and pelvis 07/11/2013. PET-CT 02/07/2013.  FINDINGS: CT CHEST FINDINGS  Since the prior examination 07/11/2013, marked progression of what is now extensive pleural metastatic disease on the right, associated with a large loculated right pleural effusion with PleurX drainage catheter in place. Large left pleural effusion with mild pleural metastatic disease on the left. Interval progression of posterior mediastinal lymphadenopathy which begins just below the carina and extends to the diaphragm, approximate measurements of 8.5 x 5.8 x 10.8 cm (series 2, image 34 and coronal image 56). Enlarged left low paratracheal lymph node approximating 2.2 cm, unchanged.  Interval development of interstitial opacities in both lungs, left greater than right. Dense passive atelectasis in the lower lobes related to the large pleural effusions. No discrete pulmonary parenchymal nodules or masses.  Heart size upper normal. No pericardial effusion. Right pericardiac low mediastinal lymph nodes, unchanged. Right jugular Port-A-Cath tip at the cavoatrial junction. Mild left gynecomastia, unchanged.  Bone window images again demonstrate the pathologic compression fracture of T9 with prior T8 through T10 fusion, the mixed lytic and sclerotic metastasis involving the lower endplate of T6, the sclerotic metastasis involving the anterior right 1st rib, and the sclerotic metastasis in the body of the right scapula.  CT ABDOMEN AND PELVIS FINDINGS  Stable metastases in the anterior segment right lobe of liver, the more superior of which near the dome measures approximately 2.3 x 3.4 cm (image 38). No new hepatic metastases. Mild diffuse hepatic steatosis. Patent portal vein. Echogenic bile within the mildly distended gallbladder. No calcified gallstones. No biliary ductal  dilation. Spleen normal in size without evidence of metastatic disease. Focus of accessory splenic tissue anterior to the spleen just below the hilum. Normal adrenal glands.  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 Non obstructing 5 mm calculi in adjacent calices of the lower pole of the right kidney. Normal-appearing left kidney. No visible aortoiliofemoral atherosclerosis.  Interval marked progression of peritoneal and omental metastatic disease throughout the upper abdomen. Conglomerate disease adjacent to the splenic hilum measures approximately 4.5 x 3.2 cm (image 51). No associated ascites.  Interval increase in size of the mass in the right side of the retroperitoneum in the retrocaval region, just below the right renal artery, current measurements approximating 4.1 x 3.5 cm (image 60). Interval increase in size of the mass in the left side of the retroperitoneum at the same level, current measurements approximately 3.0 x 1.7 cm (image 59). Duplication of the IVC again noted.  Stomach decompressed and unremarkable. Normal-appearing small bowel. Large stool burden in the rib  rectosigmoid colon. No visible colonic masses. Normal appendix in the right upper pelvis.  Urinary bladder unremarkable. Prostate gland and seminal vesicles normal for age. Numerous pelvic phleboliths. Small right inguinal hernia containing fat.  Bone window images again demonstrate the mixed lytic and sclerotic metastasis involving the L3 vertebral body, the sclerotic metastases involving both iliac bones, the largest on the left and progressive periosteal new bone formation involving the left iliac bone, the sclerotic metastases involving the left 1st and 2nd sacral ala.  IMPRESSION: 1. Significant progression of metastatic disease in the chest and abdomen as detailed above, with marked progression of pleural metastatic disease in the right hemithorax and progression of peritoneal and omental metastatic  disease in the upper abdomen in particular. 2. Large bilateral pleural effusions with passive atelectasis in the lower lobes. 3. 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 4. Mildly distended gallbladder containing echogenic bile, likely sludge. No calcified gallstones. 5. Stable widespread osseous metastatic disease.   Electronically Signed   By: Evangeline Dakin M.D.   On: 08/27/2013 16:11   Dg Chest Port 1 View  08/27/2013   CLINICAL DATA:  Shortness of breath.  Esophageal cancer.  EXAM: PORTABLE CHEST - 1 VIEW  COMPARISON:  08/06/2013.  FINDINGS: Surgical instrumentation across a pathologic thoracic fracture appears grossly unchanged. There is increasing RIGHT pleural effusion. New LEFT pleural effusion. Basilar atelectasis. Moderate vascular congestion. Stable cardiomediastinal silhouette. RIGHT paratracheal density, possible adenopathy. Port-A-Cath remains in good position. Poorly visualized PleurX drainage catheter.  IMPRESSION: Worsening aeration. Question developing RIGHT paratracheal adenopathy. Increasing BILATERAL effusions.   Electronically Signed   By: Rolla Flatten M.D.   On: 08/27/2013 12:54    EKG: Independently reviewed. 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 - Is having drainage from site, recommend consulting Dr. Roxan Hockey  With cardiothoracic surgery next am.  Code Status: Currently full (did not discuss changing to DO NOT RESUSCITATE as patient just found out palliative  care was recommended by his oncologist) DVT Prophylaxis: Heparin Family Communication: Discussed at bedside with wife Disposition Plan: To floor with plans on setting up palliative care  Time spent: > 45 minutes  Velvet Bathe [facility] Pager [phone]  **Disclaimer: This note may have been dictated with voice recognition software. Similar sounding words can inadvertently be transcribed and this note may contain transcription errors which may not have been corrected upon publication of note.**

## 2013-08-27 NOTE — ED Notes (Signed)
Bed: WA20 Expected date:  Expected time:  Means of arrival:  Comments: ems 

## 2013-08-27 NOTE — ED Notes (Signed)
Bed: RESB Expected date:  Expected time:  Means of arrival:  Comments: For Fred [name] when he arrives

## 2013-08-27 NOTE — ED Notes (Addendum)
Attempted to call report a second time, nobody ever came on the line to take report.

## 2013-08-27 NOTE — ED Provider Notes (Signed)
CSN: [id number]     Arrival date & time 08/27/13  1125 History   First MD Initiated Contact with Patient 08/27/13 1144     Chief Complaint  Patient presents with  . generalized weakness      cancer pt  . Weakness     (Consider location/radiation/quality/duration/timing/severity/associated sxs/prior Treatment) HPI Comments: Patient presents to the ER via the [facility]. Patient has a history of esophageal cancer with extensive metastatic disease. He last received chemotherapy 2 weeks ago. Has been experiencing increased fatigue, shortness of breath, likely dehydration. Patient had right-sided pleural effusion drained several weeks ago, but has been experiencing significant drainage from the wound where one attempt was made to previously.   Past Medical History  Diagnosis Date  . GERD (gastroesophageal reflux disease)     cannot taste anything  . 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 Chest tube insertion Right 07/26/2013    Procedure: INSERTION PLEURAL DRAINAGE CATHETER;  Surgeon: Melrose Nakayama, MD;  Location: [facility];  Service: Thoracic;  Laterality: Right;   History reviewed. No pertinent family history. History  Substance Use Topics  . Smoking status: Never Smoker   . Smokeless tobacco: Former Systems developer    Types: Chew  . Alcohol Use: Yes     Comment: social    Review of Systems  Constitutional: Positive for fatigue.  Respiratory: Positive for shortness of breath.   All other systems reviewed and are negative.     Allergies  Review of patient's allergies indicates no known allergies.  Home Medications   Prior to Admission medications   Medication Sig Start Date End Date Taking? Authorizing Provider  erythromycin ophthalmic ointment Place 1 application into both eyes 4 (four) times daily. 08/22/13  Yes Drue Second, NP  fluconazole (DIFLUCAN)  10 MG/ML suspension Take 10 mLs (100 mg total) by mouth daily. Take 200 mg on first day 08/19/13  Yes Lora Paula, MD  gabapentin (NEURONTIN) 100 MG capsule Take 1 capsule (100 mg total) by mouth 3 (three) times daily. 04/29/13  Yes Curt Bears, MD  lidocaine-prilocaine (EMLA) cream Apply 1 application topically as needed (for port access (skin numbing)). Apply to port 1 hr before chemo 03/13/13  Yes Curt Bears, MD  metoprolol tartrate (LOPRESSOR) 25 MG tablet Take 1 tablet (25 mg total) by mouth 2 (two) times daily. 07/14/13  Yes Verlee Monte, MD  morphine (MS CONTIN) 30 MG 12 hr tablet Take 1 tablet (30 mg total) by mouth every 12 (twelve) hours. 08/09/13  Yes Adrena E Johnson, PA-C  ondansetron (ZOFRAN-ODT) 8 MG disintegrating tablet Take 1 tablet (8 mg total) by mouth every 8 (eight) hours as needed for nausea or vomiting. 08/09/13  Yes Carlton Adam, PA-C  oxyCODONE (OXY IR/ROXICODONE) 5 MG immediate release tablet Take 1-2 tablets (5-10 mg total) by mouth every 6 (six) hours as needed for severe pain. 08/19/13  Yes Lora Paula, MD  simethicone (MYLICON) 80 MG chewable tablet Chew 160 mg by mouth 4 (four) times daily as needed for flatulence.   Yes Historical Provider, MD   BP 105/64  Pulse 122  Temp(Src) 99.4 F (37.4 C) (Oral)  Resp 21  SpO2 94% Physical Exam  Constitutional: He is oriented to person, place, and time. He appears well-nourished. No distress.  HENT:  Head: Normocephalic and atraumatic.  Right Ear: Hearing normal.  Left  Ear: Hearing normal.  Nose: Nose normal.  Mouth/Throat: Oropharynx is clear and moist and mucous membranes are normal.  Eyes: Conjunctivae and EOM are normal. Pupils are equal, round, and reactive to light.  Neck: Normal range of motion. Neck supple.  Cardiovascular: Regular rhythm, S1 normal and S2 normal.  Tachycardia present.  Exam reveals no gallop and no friction rub.   No murmur heard. Pulmonary/Chest: Accessory muscle usage  present. No respiratory distress. He has decreased breath sounds in the right middle field, the right lower field and the left lower field. He exhibits no tenderness.  Abdominal: Soft. Normal appearance and bowel sounds are normal. There is no hepatosplenomegaly. There is no tenderness. There is no rebound, no guarding, no tenderness at McBurney's point and negative Murphy's sign. No hernia.  Musculoskeletal: Normal range of motion.  Neurological: He is alert and oriented to person, place, and time. He has normal strength. No cranial nerve deficit or sensory deficit. Coordination normal. GCS eye subscore is 4. GCS verbal subscore is 5. GCS motor subscore is 6.  Skin: Skin is warm, dry and intact. No rash noted. No cyanosis.  Psychiatric: He has a normal mood and affect. His speech is normal and behavior is normal. Thought content normal.    ED Course  Procedures (including critical care time) Labs Review Labs Reviewed  CBC WITH DIFFERENTIAL - Abnormal; Notable for the following:    WBC 13.2 (*)    RBC 2.78 (*)    Hemoglobin 8.3 (*)    HCT 25.8 (*)    RDW 21.8 (*)    Neutrophils Relative % 79 (*)    Lymphocytes Relative 9 (*)    Neutro Abs 10.4 (*)    Monocytes Absolute 1.6 (*)    All other components within normal limits  COMPREHENSIVE METABOLIC PANEL - Abnormal; Notable for the following:    Sodium 133 (*)    Glucose, Bld 116 (*)    Calcium 8.1 (*)    Total Protein 5.5 (*)    Albumin 1.9 (*)    AST 46 (*)    Alkaline Phosphatase 130 (*)    All other components within normal limits  I-STAT CHEM 8, ED - Abnormal; Notable for the following:    Sodium 132 (*)    BUN 5 (*)    Glucose, Bld 111 (*)    Calcium, Ion 1.10 (*)    Hemoglobin 10.5 (*)    HCT 31.0 (*)    All other components within normal limits  CULTURE, BLOOD (ROUTINE X 2)  CULTURE, BLOOD (ROUTINE X 2)  URINE CULTURE  TROPONIN I  URINALYSIS, ROUTINE W REFLEX MICROSCOPIC  I-STAT CG4 LACTIC ACID, ED    Imaging  Review Ct Chest W Contrast  08/27/2013   CLINICAL DATA:  43 year old with widespread metastatic esophageal cancer originally diagnosed in January, 2015, presenting with generalized weakness which has worsened significantly over the past few days. CT requested to evaluate for progression of disease and possible need for referral to hospice.  EXAM: CT CHEST, ABDOMEN, AND PELVIS WITH CONTRAST  TECHNIQUE: Multidetector CT imaging of the chest, abdomen and pelvis was performed following the standard protocol during bolus administration of intravenous contrast.  CONTRAST:  118mL OMNIPAQUE IOHEXOL 300 MG/ML IV. Oral contrast was not administered at the request of the emergency department position.  COMPARISON:  CT chest abdomen and pelvis 07/11/2013. PET-CT 02/07/2013.  FINDINGS: CT CHEST FINDINGS  Since the prior examination 07/11/2013, marked progression of what is now extensive pleural metastatic  disease on the right, associated with a large loculated right pleural effusion with PleurX drainage catheter in place. Large left pleural effusion with mild pleural metastatic disease on the left. Interval progression of posterior mediastinal lymphadenopathy which begins just below the carina and extends to the diaphragm, approximate measurements of 8.5 x 5.8 x 10.8 cm (series 2, image 34 and coronal image 56). Enlarged left low paratracheal lymph node approximating 2.2 cm, unchanged.  Interval development of interstitial opacities in both lungs, left greater than right. Dense passive atelectasis in the lower lobes related to the large pleural effusions. No discrete pulmonary parenchymal nodules or masses.  Heart size upper normal. No pericardial effusion. Right pericardiac low mediastinal lymph nodes, unchanged. Right jugular Port-A-Cath tip at the cavoatrial junction. Mild left gynecomastia, unchanged.  Bone window images again demonstrate the pathologic compression fracture of T9 with prior T8 through T10 fusion, the mixed  lytic and sclerotic metastasis involving the lower endplate of T6, the sclerotic metastasis involving the anterior right 1st rib, and the sclerotic metastasis in the body of the right scapula.  CT ABDOMEN AND PELVIS FINDINGS  Stable metastases in the anterior segment right lobe of liver, the more superior of which near the dome measures approximately 2.3 x 3.4 cm (image 38). No new hepatic metastases. Mild diffuse hepatic steatosis. Patent portal vein. Echogenic bile within the mildly distended gallbladder. No calcified gallstones. No biliary ductal dilation. Spleen normal in size without evidence of metastatic disease. Focus of accessory splenic tissue anterior to the spleen just below the hilum. Normal adrenal glands.  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 Non obstructing 5 mm calculi in adjacent calices of the lower pole of the right kidney. Normal-appearing left kidney. No visible aortoiliofemoral atherosclerosis.  Interval marked progression of peritoneal and omental metastatic disease throughout the upper abdomen. Conglomerate disease adjacent to the splenic hilum measures approximately 4.5 x 3.2 cm (image 51). No associated ascites.  Interval increase in size of the mass in the right side of the retroperitoneum in the retrocaval region, just below the right renal artery, current measurements approximating 4.1 x 3.5 cm (image 60). Interval increase in size of the mass in the left side of the retroperitoneum at the same level, current measurements approximately 3.0 x 1.7 cm (image 59). Duplication of the IVC again noted.  Stomach decompressed and unremarkable. Normal-appearing small bowel. Large stool burden in the rib rectosigmoid colon. No visible colonic masses. Normal appendix in the right upper pelvis.  Urinary bladder unremarkable. Prostate gland and seminal vesicles normal for age. Numerous pelvic phleboliths. Small right inguinal hernia containing fat.  Bone window  images again demonstrate the mixed lytic and sclerotic metastasis involving the L3 vertebral body, the sclerotic metastases involving both iliac bones, the largest on the left and progressive periosteal new bone formation involving the left iliac bone, the sclerotic metastases involving the left 1st and 2nd sacral ala.  IMPRESSION: 1. Significant progression of metastatic disease in the chest and abdomen as detailed above, with marked progression of pleural metastatic disease in the right hemithorax and progression of peritoneal and omental metastatic disease in the upper abdomen in particular. 2. Large bilateral pleural effusions with passive atelectasis in the lower lobes. 3. 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 4. Mildly distended gallbladder containing echogenic bile, likely sludge. No calcified gallstones. 5. Stable widespread osseous metastatic disease.   Electronically Signed   By: Evangeline Dakin M.D.   On: 08/27/2013  16:11   Ct Abdomen Pelvis W Contrast  08/27/2013   CLINICAL DATA:  43 year old with widespread metastatic esophageal cancer originally diagnosed in January, 2015, presenting with generalized weakness which has worsened significantly over the past few days. CT requested to evaluate for progression of disease and possible need for referral to hospice.  EXAM: CT CHEST, ABDOMEN, AND PELVIS WITH CONTRAST  TECHNIQUE: Multidetector CT imaging of the chest, abdomen and pelvis was performed following the standard protocol during bolus administration of intravenous contrast.  CONTRAST:  120mL OMNIPAQUE IOHEXOL 300 MG/ML IV. Oral contrast was not administered at the request of the emergency department position.  COMPARISON:  CT chest abdomen and pelvis 07/11/2013. PET-CT 02/07/2013.  FINDINGS: CT CHEST FINDINGS  Since the prior examination 07/11/2013, marked progression of what is now extensive pleural metastatic disease on the right,  associated with a large loculated right pleural effusion with PleurX drainage catheter in place. Large left pleural effusion with mild pleural metastatic disease on the left. Interval progression of posterior mediastinal lymphadenopathy which begins just below the carina and extends to the diaphragm, approximate measurements of 8.5 x 5.8 x 10.8 cm (series 2, image 34 and coronal image 56). Enlarged left low paratracheal lymph node approximating 2.2 cm, unchanged.  Interval development of interstitial opacities in both lungs, left greater than right. Dense passive atelectasis in the lower lobes related to the large pleural effusions. No discrete pulmonary parenchymal nodules or masses.  Heart size upper normal. No pericardial effusion. Right pericardiac low mediastinal lymph nodes, unchanged. Right jugular Port-A-Cath tip at the cavoatrial junction. Mild left gynecomastia, unchanged.  Bone window images again demonstrate the pathologic compression fracture of T9 with prior T8 through T10 fusion, the mixed lytic and sclerotic metastasis involving the lower endplate of T6, the sclerotic metastasis involving the anterior right 1st rib, and the sclerotic metastasis in the body of the right scapula.  CT ABDOMEN AND PELVIS FINDINGS  Stable metastases in the anterior segment right lobe of liver, the more superior of which near the dome measures approximately 2.3 x 3.4 cm (image 38). No new hepatic metastases. Mild diffuse hepatic steatosis. Patent portal vein. Echogenic bile within the mildly distended gallbladder. No calcified gallstones. No biliary ductal dilation. Spleen normal in size without evidence of metastatic disease. Focus of accessory splenic tissue anterior to the spleen just below the hilum. Normal adrenal glands.  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 Non obstructing 5 mm calculi in adjacent calices of the lower pole of the right kidney. Normal-appearing left kidney. No  visible aortoiliofemoral atherosclerosis.  Interval marked progression of peritoneal and omental metastatic disease throughout the upper abdomen. Conglomerate disease adjacent to the splenic hilum measures approximately 4.5 x 3.2 cm (image 51). No associated ascites.  Interval increase in size of the mass in the right side of the retroperitoneum in the retrocaval region, just below the right renal artery, current measurements approximating 4.1 x 3.5 cm (image 60). Interval increase in size of the mass in the left side of the retroperitoneum at the same level, current measurements approximately 3.0 x 1.7 cm (image 59). Duplication of the IVC again noted.  Stomach decompressed and unremarkable. Normal-appearing small bowel. Large stool burden in the rib rectosigmoid colon. No visible colonic masses. Normal appendix in the right upper pelvis.  Urinary bladder unremarkable. Prostate gland and seminal vesicles normal for age. Numerous pelvic phleboliths. Small right inguinal hernia containing fat.  Bone window images again demonstrate the mixed lytic and sclerotic metastasis  involving the L3 vertebral body, the sclerotic metastases involving both iliac bones, the largest on the left and progressive periosteal new bone formation involving the left iliac bone, the sclerotic metastases involving the left 1st and 2nd sacral ala.  IMPRESSION: 1. Significant progression of metastatic disease in the chest and abdomen as detailed above, with marked progression of pleural metastatic disease in the right hemithorax and progression of peritoneal and omental metastatic disease in the upper abdomen in particular. 2. Large bilateral pleural effusions with passive atelectasis in the lower lobes. 3. 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 4. Mildly distended gallbladder containing echogenic bile, likely sludge. No calcified gallstones. 5. Stable widespread osseous metastatic  disease.   Electronically Signed   By: Evangeline Dakin M.D.   On: 08/27/2013 16:11   Dg Chest Port 1 View  08/27/2013   CLINICAL DATA:  Shortness of breath.  Esophageal cancer.  EXAM: PORTABLE CHEST - 1 VIEW  COMPARISON:  08/06/2013.  FINDINGS: Surgical instrumentation across a pathologic thoracic fracture appears grossly unchanged. There is increasing RIGHT pleural effusion. New LEFT pleural effusion. Basilar atelectasis. Moderate vascular congestion. Stable cardiomediastinal silhouette. RIGHT paratracheal density, possible adenopathy. Port-A-Cath remains in good position. Poorly visualized PleurX drainage catheter.  IMPRESSION: Worsening aeration. Question developing RIGHT paratracheal adenopathy. Increasing BILATERAL effusions.   Electronically Signed   By: Rolla Flatten M.D.   On: 08/27/2013 12:54     EKG Interpretation None        MDM   Final diagnoses:  None   metastatic esophageal cancer  Bilateral pleural effusion  Patient presents to the ER for evaluation at the recommendation of his oncologist. Patient is being treated for metastatic esophageal cancer. He has had increasing weakness and increasing shortness of breath. Patient has been diagnosed with pleural effusion in the past, chest x-ray shows worsening effusion despite indwelling drain on the right side.  Patient's oncologist feels that the patient likely is end-stage. He did start to initiate conversations about hospice yesterday, the patient is currently full code. The plan for today was to come to the ER and have labs and IV fluids performed. Additionally, CT chest, abdomen, pelvis was to be performed to evaluate for progression of disease. CT scans show extensive and progressive disease. Patient and wife were informed of the progression. They are aware that there will be no further treatment, patient will likely need hospice care. Will admit for stabilization/comfort care.    Orpah Greek, MD 08/27/13 1640

## 2013-08-27 NOTE — ED Notes (Addendum)
[facility] called (her cell phone [phone]). Pt coming in my car, refused ambulance. Pt has esophageal cancer, hx of tobacco chewing, now has mets everywhere . Severe conjuctivitis, tobrycin ointment started on Thursday, no improvement reported. Has right sided chest tube that pt reports when he coughs bubbles blood. Pt was seen in cancer center on Thursday and did not look well, possibly will pass away soon. Plan is labs, fluids, rescan CT chest, abdomen, and pelvis. If cancer progressed more than likely hospice care. Started talking about hospice care yesterday on phone.  Pt is full code.  Pt refused ambulance, reported took 3 people to carry pt down stairs.   Please call Ret[facility] Mac for any questions. md is Dr Earlie Server.

## 2013-08-27 NOTE — Progress Notes (Signed)
  CARE MANAGEMENT ED NOTE 08/27/2013  Patient:  Fred [name], Fred [name]   Account Number:  [id number]  Date Initiated:  08/27/2013  Documentation initiated by:  Livia Snellen  Subjective/Objective Assessment:   Patient presents to Ed with increased fatigue and shoortness of breath, increased drainage from right sided chest tube     Subjective/Objective Assessment Detail:   Patient has a history of esophageal cancer with extensive metastatic disease.     Action/Plan:   Action/Plan Detail:   Anticipated DC Date:       Status Recommendation to Physician:   Result of Recommendation:    Other ED Services  Consult Working [place]  CM consult  Other    Choice offered to / List presented to:  C-3 Spouse          Status of service:  Completed, signed off  ED Comments:   ED Comments Detail:  [facility] consulted for home health services.  EDCM spoke to patient and his wife Fred [name].  Patient's wife reports that patient has a visiting RN from [facility] who comes once a week to help with the patient's pleurex tube.  Patient lives at home with his wife and daughter.  Patient has a cane, walker, wheelchair, and bedside commode at home. Patient's wife is also interested in getting a hospital bed for home.  Patient needs assistance with ADL's.  Patient's wife originally wanted EDCM to place referral through to [facility] to increase services at home but then informed [facility] that they were more interested in home hospice and would like to speak to someone about this.  EDCM provided patient's wife with a list of home health agencies in [place] highlighting [facility], a list of private duty nursing agencies and [place] of home hospice agencies.  EDCM informed patient's wife that private duty nursing services would be an out of pocket expense.  EDCM spoke to Dr. Wendee Beavers to place palliative care consult for patient.  Palliative care consult placed.  Patient and patient's wife thankful for services.  No further EDCM  needs at this time.

## 2013-08-27 NOTE — Progress Notes (Signed)
Patient arrived to unit at around [place].Alert and orientedx4. Placed comfortably in bed.Pleasant.-  Sandie Ano RN

## 2013-08-27 NOTE — ED Notes (Signed)
Attempted to call report a third time, RN and charge RN unable to take report, will call back again shortly. Encouraged that this pt needs to get upstairs soon before shift change.

## 2013-08-28 ENCOUNTER — Telehealth: Payer: Self-pay | Admitting: Nurse Practitioner

## 2013-08-28 DIAGNOSIS — C7952 Secondary malignant neoplasm of bone marrow: Secondary | ICD-10-CM

## 2013-08-28 DIAGNOSIS — R5381 Other malaise: Secondary | ICD-10-CM

## 2013-08-28 DIAGNOSIS — J910 Malignant pleural effusion: Secondary | ICD-10-CM

## 2013-08-28 DIAGNOSIS — C787 Secondary malignant neoplasm of liver and intrahepatic bile duct: Secondary | ICD-10-CM

## 2013-08-28 DIAGNOSIS — R5383 Other fatigue: Principal | ICD-10-CM

## 2013-08-28 DIAGNOSIS — C159 Malignant neoplasm of esophagus, unspecified: Secondary | ICD-10-CM | POA: Diagnosis not present

## 2013-08-28 DIAGNOSIS — K1230 Oral mucositis (ulcerative), unspecified: Secondary | ICD-10-CM

## 2013-08-28 DIAGNOSIS — R531 Weakness: Secondary | ICD-10-CM

## 2013-08-28 DIAGNOSIS — R1310 Dysphagia, unspecified: Secondary | ICD-10-CM | POA: Diagnosis not present

## 2013-08-28 DIAGNOSIS — H1030 Unspecified acute conjunctivitis, unspecified eye: Secondary | ICD-10-CM

## 2013-08-28 DIAGNOSIS — C7951 Secondary malignant neoplasm of bone: Secondary | ICD-10-CM

## 2013-08-28 DIAGNOSIS — Z7189 Other specified counseling: Secondary | ICD-10-CM

## 2013-08-28 DIAGNOSIS — D6481 Anemia due to antineoplastic chemotherapy: Secondary | ICD-10-CM

## 2013-08-28 DIAGNOSIS — Z515 Encounter for palliative care: Secondary | ICD-10-CM

## 2013-08-28 DIAGNOSIS — B370 Candidal stomatitis: Secondary | ICD-10-CM

## 2013-08-28 DIAGNOSIS — C155 Malignant neoplasm of lower third of esophagus: Secondary | ICD-10-CM

## 2013-08-28 DIAGNOSIS — T451X5A Adverse effect of antineoplastic and immunosuppressive drugs, initial encounter: Secondary | ICD-10-CM

## 2013-08-28 DIAGNOSIS — K121 Other forms of stomatitis: Secondary | ICD-10-CM

## 2013-08-28 DIAGNOSIS — D72819 Decreased white blood cell count, unspecified: Secondary | ICD-10-CM

## 2013-08-28 DIAGNOSIS — D630 Anemia in neoplastic disease: Secondary | ICD-10-CM

## 2013-08-28 DIAGNOSIS — R630 Anorexia: Secondary | ICD-10-CM

## 2013-08-28 LAB — COMPREHENSIVE METABOLIC PANEL
ALK PHOS: 121 U/L — AB (ref 39–117)
ALT: 14 U/L (ref 0–53)
AST: 44 U/L — AB (ref 0–37)
Albumin: 2 g/dL — ABNORMAL LOW (ref 3.5–5.2)
Anion gap: 13 (ref 5–15)
BILIRUBIN TOTAL: 1 mg/dL (ref 0.3–1.2)
BUN: 9 mg/dL (ref 6–23)
CHLORIDE: 93 meq/L — AB (ref 96–112)
CO2: 23 meq/L (ref 19–32)
Calcium: 8 mg/dL — ABNORMAL LOW (ref 8.4–10.5)
Creatinine, Ser: 0.75 mg/dL (ref 0.50–1.35)
GFR calc Af Amer: >90 mL/min (ref >90)
GFR calc non Af Amer: >90 mL/min (ref >90)
Glucose, Bld: 101 mg/dL — ABNORMAL HIGH (ref 70–99)
POTASSIUM: 4.8 meq/L (ref 3.7–5.3)
Sodium: 129 mEq/L — ABNORMAL LOW (ref 137–147)
Total Protein: 5.4 g/dL — ABNORMAL LOW (ref 6.0–8.3)

## 2013-08-28 LAB — URINE CULTURE
COLONY COUNT: NO GROWTH
Culture: NO GROWTH

## 2013-08-28 MED ORDER — BENEPROTEIN PO POWD: 1.0000 | Freq: Two times a day (BID) | ORAL | Status: DC

## 2013-08-28 MED ORDER — LORAZEPAM 1 MG PO TABS: 1.0000 mg | ORAL_TABLET | Freq: Four times a day (QID) | ORAL | Status: DC | PRN

## 2013-08-28 MED ORDER — LIP MEDEX EX OINT
TOPICAL_OINTMENT | CUTANEOUS | Status: AC
  Administered 2013-08-28: 08:00:00
  Filled 2013-08-28: qty 7

## 2013-08-28 MED ORDER — ENSURE COMPLETE PO LIQD
237.0000 mL | Freq: Three times a day (TID) | ORAL | Status: DC
  Administered 2013-08-28 – 2013-08-29 (×5): 237 mL via ORAL

## 2013-08-28 MED ORDER — MORPHINE SULFATE (CONCENTRATE) 10 MG /0.5 ML PO SOLN: 5.0000 mg | ORAL | Status: DC | PRN

## 2013-08-28 MED ORDER — RESOURCE INSTANT PROTEIN PO PWD PACKET
1.0000 | Freq: Two times a day (BID) | ORAL | Status: DC
  Administered 2013-08-29 – 2013-08-30 (×3): 6 g via ORAL
  Filled 2013-08-28 (×6): qty 6

## 2013-08-28 MED ORDER — BISACODYL 10 MG RE SUPP
10.0000 mg | Freq: Every day | RECTAL | Status: DC | PRN
Start: 2013-08-28 — End: 2013-08-30

## 2013-08-28 MED ORDER — PRO-STAT SUGAR FREE PO LIQD
30.0000 mL | Freq: Two times a day (BID) | ORAL | Status: DC
  Administered 2013-08-29 – 2013-08-30 (×3): 30 mL via ORAL
  Filled 2013-08-28 (×6): qty 30

## 2013-08-28 NOTE — Progress Notes (Signed)
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   MOLECULAR BIOMARKERS: Foundation one: Amplification of PIK3CA, RAF1, CCND1, MYC(equivocal), PRKCl, SOX2, EPHB1, FGF19, FGF3, FGF4, GATA6, TERC and [id number], [id number].   PRIOR THERAPY:  1) status post T8-T10 spinal fusion under the care of Dr. Vertell Limber.  2) status post a stereotactic radiotherapy to the T9 lesion under the care of Dr. [name].  3) Systemic chemotherapy with EOX, (epirubicin, oxaliplatin and Xeloda) every 3 weeks. First dose on 03/18/2013, status post 3 cycles discontinued today secondary to disease progression.  4) systemic chemotherapy with cisplatin 30 mg/M2 and irinotecan 65 mg/M2 days 1 and 8 every 3 weeks. Status post 2 cycles   CURRENT THERAPY: systemic chemotherapy with docetaxel 75 mg/m2 and ramucirumab (Cyramza) 10 mg/kg. First cycle given on 07/19/2013. Status post 2 cycles   He received his last cycle of chemotherapy on 08/09/2013. was scheduled for cycle 3 on 08/30/2013.    Subjective:  Fred [name] 43 y.o. male diagnosed with esophageal cancer. He is currently undergoing docetaxel/cyramza chemotherapy regimen as detailed above. He was [facility] at the [facility] on 8/20 with dehydration requiring  IV fluid, increased weakness, and worsening mucositis not improved with Diflucan as outpatient. He reported minimal appetite and continued weight loss, fatigue and generalized weakness. In addition, he developed bilateral conjunctivitis with purulent discharge requiring topical antibiotic. He denied any fever or chills. Supportive care was given, but symptoms worsened for which he was admitted on 8/25 via ED by the hospitalist team. CT of the chest, abdomen and pelvis revealed marked progression of pleural metastatic disease in the right hemithorax and progression of peritoneal and omental metastatic disease in the  upper abdomen in particular. Large bilateral pleural effusions with passive atelectasis and lymphangitic carcinomatosis were seen. Stable widespread osseous metastatic disease was noted. No further chemotherapy was recommended due to overall clinical decline.  Wh have been kindly informed of the patient's admission.  Scheduled Meds: . erythromycin  1 application Both Eyes QID  . fluconazole  100 mg Oral Daily  . gabapentin  100 mg Oral TID  . heparin  5,000 Units Subcutaneous 3 times per day  . lip balm      . metoprolol tartrate  25 mg Oral BID  . morphine  30 mg Oral Q12H  . sodium chloride  3 mL Intravenous Q12H   Continuous Infusions:  PRN Meds:.sodium chloride, lidocaine-prilocaine, morphine injection, ondansetron, oxyCODONE, simethicone, sodium chloride   Objective:  Filed Vitals:   08/28/13 0429  BP: 109/68  Pulse: 135  Temp: 99.2 F (37.3 C)  Resp: 18      Intake/Output Summary (Last 24 hours) at 08/28/13 0830 Last data filed at 08/27/13 2115  Gross per 24 hour  Intake      0 ml  Output    700 ml  Net   -700 ml    ECOG PERFORMANCE STATUS:2-3  GENERAL:alert, no distress and chronically ill appearing, flat affect. SKIN: skin color, texture, turgor are dry, no rashes or significant lesions EYES: conjunctivitis resolving,less purulence seen. OROPHARYNX:  no erythema. Mucositis noted worse on L>R. SOme white coating noted on the posterior oropharynx. NECK: supple, thyroid normal size, non-tender, without nodularity LYMPH:  no palpable lymphadenopathy in the cervical, axillary or inguinal LUNGS: decreased breath sounds at the bases HEART: regular rate & rhythm and no murmurs and no lower extremity edema ABDOMEN:abdomen soft, non-tender and normal  bowel sounds Musculoskeletal:no cyanosis of digits and no clubbing  PSYCH: alert & oriented x 3 with fluent speech NEURO: no focal motor/sensory deficits    CBG (last 3)  No results found for this basename: GLUCAP,  in  the last 72 hours   Labs:   Recent Labs Lab 08/22/13 1556 08/27/13 1205 08/27/13 1221 08/27/13 2252  WBC 16.1* 13.2*  --  11.0*  HGB 9.2* 8.3* 10.5* 7.9*  HCT 28.9* 25.8* 31.0* 24.6*  PLT 171 222  --  200  MCV 91.2 92.8  --  93.9  MCH 29.0 29.9  --  30.2  MCHC 31.8* 32.2  --  32.1  RDW 21.7* 21.8*  --  21.9*  LYMPHSABS 0.8* 1.2  --   --   MONOABS 1.1* 1.6*  --   --   EOSABS 0.0 0.0  --   --   BASOSABS 0.1 0.0  --   --      Chemistries:    Recent Labs Lab 08/22/13 1556 08/27/13 1205 08/27/13 1221 08/27/13 2252  NA 131* 133* 132*  --   K 3.9 4.3 4.2  --   CL  --  96 96  --   CO2 25 25  --   --   GLUCOSE 145* 116* 111*  --   BUN 7.4 7 5*  --   CREATININE 0.7 0.67 0.70 0.64  CALCIUM 8.1* 8.1*  --   --   MG  --   --   --  1.5  AST 58* 46*  --   --   ALT 15 14  --   --   ALKPHOS 176* 130*  --   --   BILITOT 1.14 1.0  --   --     GFR Estimated Creatinine Clearance: 128 ml/min (by C-G formula based on Cr of 0.64).  Liver Function Tests:  Recent Labs Lab 08/22/13 1556 08/27/13 1205  AST 58* 46*  ALT 15 14  ALKPHOS 176* 130*  BILITOT 1.14 1.0  PROT 5.7* 5.5*  ALBUMIN 2.2* 1.9*   No results found for this basename: LIPASE, AMYLASE,  in the last 168 hours No results found for this basename: AMMONIA,  in the last 168 hours  Urine Studies     Component Value Date/Time   COLORURINE AMBER* 08/27/2013 [place] 08/27/2013 1656   LABSPEC 1.037* 08/27/2013 1656   PHURINE 6.0 08/27/2013 1656   GLUCOSEU NEGATIVE 08/27/2013 [place] 08/27/2013 1656   BILIRUBINUR SMALL* 08/27/2013 1656   KETONESUR NEGATIVE 08/27/2013 1656   PROTEINUR NEGATIVE 08/27/2013 1656   UROBILINOGEN 1.0 08/27/2013 1656   NITRITE NEGATIVE 08/27/2013 1656   LEUKOCYTESUR TRACE* 08/27/2013 1656    Coagulation profile No results found for this basename: INR, PROTIME,  in the last 168 hours  Cardiac Enzymes:  Recent Labs Lab 08/27/13 1205  TROPONINI <0.30       Imaging Studies:  Ct Chest W Contrast  08/27/2013   COMPARISON:  CT chest abdomen and pelvis 07/11/2013. PET-CT 02/07/2013.  FINDINGS: CT CHEST FINDINGS  Since the prior examination 07/11/2013, marked progression of what is now extensive pleural metastatic disease on the right, associated with a large loculated right pleural effusion with PleurX drainage catheter in place. Large left pleural effusion with mild pleural metastatic disease on the left. Interval progression of posterior mediastinal lymphadenopathy which begins just below the carina and extends to the diaphragm, approximate measurements of 8.5 x 5.8 x 10.8 cm (series 2,  image 34 and coronal image 56). Enlarged left low paratracheal lymph node approximating 2.2 cm, unchanged.  Interval development of interstitial opacities in both lungs, left greater than right. Dense passive atelectasis in the lower lobes related to the large pleural effusions. No discrete pulmonary parenchymal nodules or masses.  Heart size upper normal. No pericardial effusion. Right pericardiac low mediastinal lymph nodes, unchanged. Right jugular Port-A-Cath tip at the cavoatrial junction. Mild left gynecomastia, unchanged.  Bone window images again demonstrate the pathologic compression fracture of T9 with prior T8 through T10 fusion, the mixed lytic and sclerotic metastasis involving the lower endplate of T6, the sclerotic metastasis involving the anterior right 1st rib, and the sclerotic metastasis in the body of the right scapula.   IMPRESSION: 1. Significant progression of metastatic disease in the chest and abdomen as detailed above, with marked progression of pleural metastatic disease in the right hemithorax and progression of peritoneal and omental metastatic disease in the upper abdomen in particular. 2. Large bilateral pleural effusions with passive atelectasis in the lower lobes. 3. New interstitial opacities in both lungs, left greater than right, likely  lymphangitic carcinomatosis. 4. Mildly distended gallbladder containing echogenic bile, likely sludge. No calcified gallstones. 5. Stable widespread osseous metastatic disease.   Electronically Signed   By: Evangeline Dakin M.D.   On: 08/27/2013 16:11   Ct Abdomen Pelvis W Contrast  08/27/2013  COMPARISON:  CT chest abdomen and pelvis 07/11/2013. PET-CT 02/07/2013.  FINDINGS  CT ABDOMEN AND PELVIS FINDINGS  Stable metastases in the anterior segment right lobe of liver, the more superior of which near the dome measures approximately 2.3 x 3.4 cm (image 38). No new hepatic metastases. Mild diffuse hepatic steatosis. Patent portal vein. Echogenic bile within the mildly distended gallbladder. No calcified gallstones. No biliary ductal dilation. Spleen normal in size without evidence of metastatic disease. Focus of accessory splenic tissue anterior to the spleen just below the hilum. Normal adrenal glands.  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 Non obstructing 5 mm calculi in adjacent calices of the lower pole of the right kidney. Normal-appearing left kidney. No visible aortoiliofemoral atherosclerosis.  Interval marked progression of peritoneal and omental metastatic disease throughout the upper abdomen. Conglomerate disease adjacent to the splenic hilum measures approximately 4.5 x 3.2 cm (image 51). No associated ascites.  Interval increase in size of the mass in the right side of the retroperitoneum in the retrocaval region, just below the right renal artery, current measurements approximating 4.1 x 3.5 cm (image 60). Interval increase in size of the mass in the left side of the retroperitoneum at the same level, current measurements approximately 3.0 x 1.7 cm (image 59). Duplication of the IVC again noted.  Stomach decompressed and unremarkable. Normal-appearing small bowel. Large stool burden in the rib rectosigmoid colon. No visible colonic masses. Normal appendix in the  right upper pelvis.  Urinary bladder unremarkable. Prostate gland and seminal vesicles normal for age. Numerous pelvic phleboliths. Small right inguinal hernia containing fat.  Bone window images again demonstrate the mixed lytic and sclerotic metastasis involving the L3 vertebral body, the sclerotic metastases involving both iliac bones, the largest on the left and progressive periosteal new bone formation involving the left iliac bone, the sclerotic metastases involving the left 1st and 2nd sacral ala.  IMPRESSION: 1. Significant progression of metastatic disease in the chest and abdomen as detailed above, with marked progression of pleural metastatic disease in the right hemithorax and progression of peritoneal and omental metastatic  disease in the upper abdomen in particular. 2. Large bilateral pleural effusions with passive atelectasis in the lower lobes. 3. 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 4. Mildly distended gallbladder containing echogenic bile, likely sludge. No calcified gallstones. 5. Stable widespread osseous metastatic disease.   Electronically Signed   By: Evangeline Dakin M.D.   On: 08/27/2013 16:11   Dg Chest Port 1 View  08/27/2013   CLINICAL DATA:  Shortness of breath.  Esophageal cancer.  EXAM: PORTABLE CHEST - 1 VIEW  COMPARISON:  08/06/2013.  FINDINGS: Surgical instrumentation across a pathologic thoracic fracture appears grossly unchanged. There is increasing RIGHT pleural effusion. New LEFT pleural effusion. Basilar atelectasis. Moderate vascular congestion. Stable cardiomediastinal silhouette. RIGHT paratracheal density, possible adenopathy. Port-A-Cath remains in good position. Poorly visualized PleurX drainage catheter.  IMPRESSION: Worsening aeration. Question developing RIGHT paratracheal adenopathy. Increasing BILATERAL effusions.   Electronically Signed   By: Rolla Flatten M.D.   On: 08/27/2013 12:54     Assessment/Plan: 43 y.o.  Metastatic esophageal adenocarcinoma with Negative HER-2  diagnosed in January of 2015. s/p radiation and chemotherapy. Latest therapy included systemic chemotherapy with docetaxel and ramucirumab (Cyramza) CT of the chest, abdomen and pelvis shows progression of disease. No further chemo is planned. Palliative Care to see. He is currently Full Code, but status to be discussed due to clinical decline.   Conjunctivitis, acute, bilateral  On erythromycin ointment   Fatigue  Failure to thrive in adult  secondary to dysphagia due to candidiasis and mucositis, as well as progression of disease  Supportive therapy with diflucan, magic mouthwash, analgesics Consider nutrition evaluation. May need appetite stimulant  Malignant pleural effusion  Patient status post Pleurx catheter placement.   Dr. Roxan Hockey With cardiothoracic surgery to see due to some drainage noted on site.  Anemia Secondary to malignancy, recent chemo, malnutrition and dilution No transfusion indicated at this time. No bleeding issues noted  Leukocytosis Likely reactive and recent Neulasta on 8/8 Cultures pending Other medical issues as per admitting team   DVT prophylaxis on Heparin   **Disclaimer: This note was dictated with voice recognition software. Similar sounding words can inadvertently be transcribed and this note may contain transcription errors which may not have been corrected upon publication of note.Sharene Butters E, PA-C 08/28/2013  8:30 AM  ADDENDUM: Hematology/Oncology Attending: The patient is seen and examined today. I agree with the above note. This is a very pleasant and unfortunate 43 years old white male with metastatic esophageal adenocarcinoma diagnosed in January of 2015 status post several chemotherapy regimens, lately with docetaxel and Cyramza but unfortunately he continues to have evidence with disease progression. He has general deterioration of  his condition and his wife was unable to take care of him at home because of the significant weakness and fatigue as well as failure to thrive. He also has tightness of his chest secondary to reaccumulation of left pleural effusion which was drained yesterday. The patient also has conjunctivitis of the right eye. He was started on erythromycin ointment. I have a lengthy discussion with the patient and his wife last night as well as this morning about his current condition. I strongly recommended for them to consider palliative care and hospice referral at this point and the patient and his family are in agreement. They need some equipment at home like hospital bed and bedside commode as well as shower chair before his discharge. I will continue with the current supportive care for now  and I will be happy to take care of his hospice needs once he discharged home. Thank you so much for taking good care of Mr. Stoutenburg, I will continue to follow up the patient with you and assist in his management on as-needed basis.

## 2013-08-28 NOTE — Progress Notes (Signed)
Patient ID: Fred [name], male   DOB: [birth year], 43 y.o.   MRN: [id number] [facility] PROGRESS NOTE  Fred [name] OEU:[id number] DOB: August 20, 1970 DOA: 08/27/2013 PCP: [name], MD  Brief narrative: 43 y.o. male with esophageal carcinoma with metastases to lungs and abdomen, last chemotherapy 2 weeks ago, hypertension, bilateral conjunctivitis, recent Pleurx catheter placement who presented to [facility] ED with main concern of progressive failure to thrive.   Assessment and Plan:    Active Problems:   Esophageal cancer with metastatic disease to lung and abdomen - progressive - pt and family in agreement with continuing to provide comfort and desire to to be discharged by Friday with hospice - will ensure that comfort is provided - appreciate oncology, radiation oncology, PCT assistance  - emotional support provided    Conjunctivitis, acute, bilateral - eye drops as needed    Failure to thrive in adult - secondary to progressive nature of the illness - comfort feeding as pt able to tolerate    DNR (do not resuscitate) discussion   Acute on chronic blood loss anemia - no transfusion desired at this time - no further blood work to ensure comfort   Severe PCM - secondary to the illness noted above   DVT prophylaxis  SCD  Code Status: DNR Family Communication: Pt at bedside Disposition Plan: Remains inpatient    IV Access:   Peripheral IV Procedures and diagnostic studies:   Ct Chest W Contrast  Ct Abdomen Pelvis W Contrast  08/27/2013   Significant progression of metastatic disease in the chest and abdomen with marked progression of pleural metastatic disease in the right hemithorax and progression of peritoneal and omental metastatic disease in the upper abdomen in particular. 2. Large bilateral pleural effusions with passive atelectasis in the lower lobes. 3. 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 4. Mildly  distended gallbladder containing echogenic bile, likely sludge. No calcified gallstones. Stable widespread osseous metastatic disease.     CXR  08/27/13 Worsening aeration, ? developing R paratracheal adenopathy, increasing B effusions.    Medical Consultants:   Oncology Palliative care  Other Consultants:   None  Anti-Infectives:   Fluconazole   Faye Ramsay, MD  [facility] Pager [phone]  If 7PM-7AM, please contact night-coverage [email or web address] Password [facility] 08/28/2013, 5:35 PM   LOS: 1 day   HPI/Subjective: No events overnight.   Objective: Filed Vitals:   08/27/13 1830 08/27/13 1902 08/28/13 0429 08/28/13 1412  BP: 106/80 118/78 109/68 105/71  Pulse: 130 120 135 116  Temp:  97.8 F (36.6 C) 99.2 F (37.3 C) 97.9 F (36.6 C)  TempSrc:  Oral Oral Oral  Resp: 19 18 18 16   Height:  6' (1.829 m)    Weight:  89.812 kg (198 lb)    SpO2: 96% 94% 97% 97%    Intake/Output Summary (Last 24 hours) at 08/28/13 1735 Last data filed at 08/28/13 1007  Gross per 24 hour  Intake    240 ml  Output    700 ml  Net   -460 ml    Exam:   General:  Pt is alert, follows commands appropriately, not in acute distress  Cardiovascular: Regular rhythm, tachycardic, no rubs, no gallops  Respiratory: Poor inspiratory effort and diminished breath sounds at bases   Abdomen: Soft, non tender, non distended, bowel sounds present, no guarding  Data Reviewed: Basic Metabolic Panel:  Recent Labs Lab 08/22/13 1556 08/27/13 1205 08/27/13 1221 08/27/13 2252 08/28/13 1000  NA 131* 133* 132*  --  129*  K 3.9 4.3 4.2  --  4.8  CL  --  96 96  --  93*  CO2 25 25  --   --  23  GLUCOSE 145* 116* 111*  --  101*  BUN 7.4 7 5*  --  9  CREATININE 0.7 0.67 0.70 0.64 0.75  CALCIUM 8.1* 8.1*  --   --  8.0*  MG  --   --   --  1.5  --   PHOS  --   --   --  4.2  --    Liver Function Tests:  Recent Labs Lab 08/22/13 1556 08/27/13 1205 08/28/13 1000  AST 58* 46* 44*  ALT 15 14 14    ALKPHOS 176* 130* 121*  BILITOT 1.14 1.0 1.0  PROT 5.7* 5.5* 5.4*  ALBUMIN 2.2* 1.9* 2.0*   CBC:  Recent Labs Lab 08/22/13 1556 08/27/13 1205 08/27/13 1221 08/27/13 2252  WBC 16.1* 13.2*  --  11.0*  NEUTROABS 14.0* 10.4*  --   --   HGB 9.2* 8.3* 10.5* 7.9*  HCT 28.9* 25.8* 31.0* 24.6*  MCV 91.2 92.8  --  93.9  PLT 171 222  --  200   Cardiac Enzymes:  Recent Labs Lab 08/27/13 1205  TROPONINI <0.30   Recent Results (from the past 240 hour(s))  CULTURE, BLOOD (ROUTINE X 2)     Status: None   Collection Time    08/27/13 12:05 PM      Result Value Ref Range Status   Specimen Description BLOOD LEFT WRIST   Final   Special Requests BOTTLES DRAWN AEROBIC AND ANAEROBIC 3ML   Final   Culture  Setup Time     Final   Value: 08/27/2013 14:28     Performed at [facility]   Culture     Final   Value:        BLOOD CULTURE RECEIVED NO GROWTH TO DATE CULTURE WILL BE HELD FOR 5 DAYS BEFORE ISSUING A FINAL NEGATIVE REPORT     Performed at [facility]   Report Status PENDING   Incomplete  CULTURE, BLOOD (ROUTINE X 2)     Status: None   Collection Time    08/27/13 12:07 PM      Result Value Ref Range Status   Specimen Description BLOOD LEFT HAND   Final   Special Requests BOTTLES DRAWN AEROBIC ONLY 4ML   Final   Culture  Setup Time     Final   Value: 08/27/2013 14:28     Performed at [facility]   Culture     Final   Value:        BLOOD CULTURE RECEIVED NO GROWTH TO DATE CULTURE WILL BE HELD FOR 5 DAYS BEFORE ISSUING A FINAL NEGATIVE REPORT     Performed at [facility]   Report Status PENDING   Incomplete     Scheduled Meds: . erythromycin  1 application Both Eyes QID  . fluconazole  100 mg Oral Daily  . gabapentin  100 mg Oral TID  . metoprolol tartrate  25 mg Oral BID  . morphine  30 mg Oral Q12H   Continuous Infusions:

## 2013-08-28 NOTE — Progress Notes (Signed)
UR completed 

## 2013-08-28 NOTE — Care Management Note (Signed)
    Page 1 of 1   08/28/2013     3:35:14 PM CARE MANAGEMENT NOTE 08/28/2013  Patient:  Fred [name], Fred [name]   Account Number:  [id number]  Date Initiated:  08/28/2013  Documentation initiated by:  [facility]  Subjective/Objective Assessment:   adm: weakness/esophageal carcinoma     Action/Plan:   home with Hospice   Anticipated DC Date:  08/29/2013   Anticipated DC Plan:  [place]  CM consult      PAC Choice  HOSPICE   Choice offered to / List presented to:  C-3 Spouse           HH agency  [facility] [place]   Status of service:  Completed, signed off Medicare Important Message given?   (If response is "NO", the following Medicare IM given date fields will be blank) Date Medicare IM given:   Medicare IM given by:   Date Additional Medicare IM given:   Additional Medicare IM given by:    Discharge Disposition:  [place]  Per UR Regulation:    If discussed at Long Length of Stay Meetings, dates discussed:    Comments:  08/28/13 15:30 CM spoke with wife of pt (pt sleeping) for choice of home hospice.  Pt chooses [facility].  CM called referral to [facility] with hopeful discharge date of 08/29/13.  Vickie states Evette has already assigned this case to [place].  Larene Beach states she has spoken with [facility].  No other CM needs were communicated.  Mariane Masters, BSN, [place].

## 2013-08-28 NOTE — Progress Notes (Signed)
Patient refused blood draw this morning for CMP.

## 2013-08-28 NOTE — Consult Note (Signed)
Patient Fred [name]      DOB: 1970/01/29      IRW:[id number]     Consult Note from the Palliative Medicine Team at [facility] Requested by: Dr Doyle Askew     PCP: [name], MD Reason for Consultation:[place]arif[place]ion of [place] and options     Phone Number:[phone]  Assessment of patients Current state:  Continued physical and functional decline 2/2 to metastatic esophageal cancer.  No further treatment being offered.  Patient and family faced with advanced directive decisions and anticipatory care needs.  Consult is for review of medical treatment options, clarification of goals of care and end of life issues, disposition and options, and symptom recommendation.  This NP Wadie Lessen reviewed medical records, received report from team, assessed the patient and then meet at the patient's bedside along with his wife Fred [name] to discuss diagnosis prognosis, Arnold, EOL wishes disposition and options.  A detailed discussion was had today regarding advanced directives.  Concepts specific to code status, artifical feeding and hydration, continued IV antibiotics and rehospitalization was had.  The difference between a aggressive medical intervention path  and a palliative comfort care path for this patient at this time was had.  Values and goals of care important to patient and family were attempted to be elicited.  Concept of Hospice and Palliative Care were discussed  Natural trajectory and expectations at EOL were discussed.  Questions and concerns addressed.  Hard Choices booklet left for review. Family encouraged to call with questions or concerns.  PMT will continue to support holistically.   Goals of Care: 1.  Code Status: DNR/DNI-comfort is main focus of care   2. Scope of Treatment: 1. Vital Signs:daily  2. Respiratory/Oxygen: for comfort only 3. Nutritional Support/Tube Feeds: no artifical feeding now or in the future 4. Antibiotics:oral agents only, for comfort  only 5. Blood Products:none 6. PYP:PJKD 7. Review of Medications to be discontinued:minimize for comfort 8. Labs:none     3. Disposition: Home with hospice services in place, will write for choice.  Plan is for discharge on Friday   4. Symptom Management:   1. Anxiety/Agitation: Ativan 1 mg po every 6 hrs prn 2. Pain: MS Contin 30 mg every 12 hrs                  Roxanol soln  5 mg every 2 hrs po/sl prn  3. Bowel Regimen:Dulcolax supp prn daily   5. Psychosocial: Emotional support offered to patient and his family at bedside.  Patient is able to communicate his understanding that "his time is coming soon".  He has expressed his desire for comfort and dignity at home.  Family fully support patient's wishes  6. Spiritual: Strong community church support   Patient Documents Completed or Given: Document Given Completed  Advanced Directives Pkt    MOST  yes  DNR    Gone from My Sight    Hard Choices yes     Brief HPI:  Fred [name] 43 y.o. male  with metastatic esophageal cancer. Seen at the [facility] on 8/20 with dehydration requiring IV fluid, increased weakness, and worsening mucositis not improved with Diflucan as outpatient. He reports minimal appetite and continued weight loss, fatigue and generalized weakness. Treated for bilateral conjunctivitis with purulent discharge requiring topical antibiotic.  Supportive care was given, but symptoms worsened for which he was admitted on 8/25   CT of the chest, abdomen and pelvis revealed marked progression of pleural metastatic  disease in the right hemithorax and progression of peritoneal and omental metastatic disease in the upper abdomen in particular. Large bilateral pleural effusions with passive atelectasis and lymphangitic carcinomatosis were seen. Stable widespread osseous metastatic disease was noted. No further chemotherapy was recommended due to overall clinical decline.   ROS: weakness, dysphagia   PMH:  Past  Medical History  Diagnosis Date  . GERD (gastroesophageal reflux disease)     cannot taste anything  . 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 Chest tube insertion Right 07/26/2013    Procedure: INSERTION PLEURAL DRAINAGE CATHETER;  Surgeon: Melrose Nakayama, MD;  Location: [facility];  Service: Thoracic;  Laterality: Right;   I have reviewed the [place] and SH and  If appropriate update it with new information. No Known Allergies Scheduled Meds: . erythromycin  1 application Both Eyes QID  . feeding supplement (ENSURE COMPLETE)  237 mL Oral TID WC  . feeding supplement (PRO-STAT SUGAR FREE 64)  30 mL Oral BID WC  . fluconazole  100 mg Oral Daily  . gabapentin  100 mg Oral TID  . heparin  5,000 Units Subcutaneous 3 times per day  . metoprolol tartrate  25 mg Oral BID  . morphine  30 mg Oral Q12H  . protein supplement  1 scoop Oral BID WC  . sodium chloride  3 mL Intravenous Q12H   Continuous Infusions:  PRN Meds:.sodium chloride, lidocaine-prilocaine, morphine injection, ondansetron, oxyCODONE, simethicone, sodium chloride    BP 109/68  Pulse 135  Temp(Src) 99.2 F (37.3 C) (Oral)  Resp 18  Ht 6' (1.829 m)  Wt 89.812 kg (198 lb)  BMI 26.85 kg/m2  SpO2 97%   PPS: 30%   Intake/Output Summary (Last 24 hours) at 08/28/13 1209 Last data filed at 08/28/13 1007  Gross per 24 hour  Intake    240 ml  Output    700 ml  Net   -460 ml   LBM: PTA                       Physical Exam:  General: ill appearing, NAD HEENT: noted mucositis, moist buccal membranes,  Conjunctivitis "better" minimal exudate Chest:  CTA CVS: tachycardia Abdomen   Soft NT +BS Ext: without edema Neuro:  Alert and oriented X3  Labs: CBC    Component Value Date/Time   WBC 11.0* 08/27/2013 2252   WBC 16.1* 08/22/2013 1556   RBC 2.62* 08/27/2013 2252   RBC 3.17* 08/22/2013  1556   HGB 7.9* 08/27/2013 2252   HGB 9.2* 08/22/2013 1556   HCT 24.6* 08/27/2013 2252   HCT 28.9* 08/22/2013 1556   PLT 200 08/27/2013 2252   PLT 171 08/22/2013 1556   MCV 93.9 08/27/2013 2252   MCV 91.2 08/22/2013 1556   MCH 30.2 08/27/2013 2252   MCH 29.0 08/22/2013 1556   MCHC 32.1 08/27/2013 2252   MCHC 31.8* 08/22/2013 1556   RDW 21.9* 08/27/2013 2252   RDW 21.7* 08/22/2013 1556   LYMPHSABS 1.2 08/27/2013 1205   LYMPHSABS 0.8* 08/22/2013 1556   MONOABS 1.6* 08/27/2013 1205   MONOABS 1.1* 08/22/2013 1556   EOSABS 0.0 08/27/2013 1205   EOSABS 0.0 08/22/2013 1556   BASOSABS 0.0 08/27/2013 1205   BASOSABS 0.1 08/22/2013 1556    BMET    Component Value Date/Time   NA  129* 08/28/2013 1000   NA 131* 08/22/2013 1556   K 4.8 08/28/2013 1000   K 3.9 08/22/2013 1556   CL 93* 08/28/2013 1000   CO2 23 08/28/2013 1000   CO2 25 08/22/2013 1556   GLUCOSE 101* 08/28/2013 1000   GLUCOSE 145* 08/22/2013 1556   BUN 9 08/28/2013 1000   BUN 7.4 08/22/2013 1556   CREATININE 0.75 08/28/2013 1000   CREATININE 0.7 08/22/2013 1556   CALCIUM 8.0* 08/28/2013 1000   CALCIUM 8.1* 08/22/2013 1556   GFRNONAA >90 08/28/2013 1000   GFRAA >90 08/28/2013 1000    CMP     Component Value Date/Time   NA 129* 08/28/2013 1000   NA 131* 08/22/2013 1556   K 4.8 08/28/2013 1000   K 3.9 08/22/2013 1556   CL 93* 08/28/2013 1000   CO2 23 08/28/2013 1000   CO2 25 08/22/2013 1556   GLUCOSE 101* 08/28/2013 1000   GLUCOSE 145* 08/22/2013 1556   BUN 9 08/28/2013 1000   BUN 7.4 08/22/2013 1556   CREATININE 0.75 08/28/2013 1000   CREATININE 0.7 08/22/2013 1556   CALCIUM 8.0* 08/28/2013 1000   CALCIUM 8.1* 08/22/2013 1556   PROT 5.4* 08/28/2013 1000   PROT 5.7* 08/22/2013 1556   ALBUMIN 2.0* 08/28/2013 1000   ALBUMIN 2.2* 08/22/2013 1556   AST 44* 08/28/2013 1000   AST 58* 08/22/2013 1556   ALT 14 08/28/2013 1000   ALT 15 08/22/2013 1556   ALKPHOS 121* 08/28/2013 1000   ALKPHOS 176* 08/22/2013 1556   BILITOT 1.0 08/28/2013 1000   BILITOT 1.14 08/22/2013  1556   GFRNONAA >90 08/28/2013 1000   GFRAA >90 08/28/2013 1000   IMPRESSION: CT 08-27-13 1. Significant progression of metastatic disease in the chest and  abdomen as detailed above, with marked progression of pleural  metastatic disease in the right hemithorax and progression of  peritoneal and omental metastatic disease in the upper abdomen in  particular.  2. Large bilateral pleural effusions with passive atelectasis in the  lower lobes.  3. 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  4. Mildly distended gallbladder containing echogenic bile, likely  sludge. No calcified gallstones.  5. Stable widespread osseous metastatic disease. IMPRESSION:  1. Significant progression of metastatic disease in the chest and  abdomen as detailed above, with marked progression of pleural  metastatic disease in the right hemithorax and progression of  peritoneal and omental metastatic disease in the upper abdomen in  particular.  2. Large bilateral pleural effusions with passive atelectasis in the  lower lobes.  3. 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  4. Mildly distended gallbladder containing echogenic bile, likely  sludge. No calcified gallstones.  5. Stable widespread osseous metastatic disease.   Time In Time Out Total Time Spent with Patient Total Overall Time  1230 1400 80 min 90 min    Greater than 50%  of this time was spent counseling and coordinating care related to the above assessment and plan.   Wadie Lessen NP  Palliative Medicine Team Team Phone # [phone] Pager [phone]  Discussed with Dr Doyle Askew

## 2013-08-28 NOTE — Progress Notes (Signed)
Received referral for [facility] ) services to follow after discharge.  Per chart review and discussion with PMT NP- pt/wife planning for discharge Friday. Writer contacted wife Larene Beach and scheduled time to meet tomorrow, Thursday 08/29/13, wife requests talking away from patient initially. Danton Sewer, RN MSN, [facility] Liaison [phone]

## 2013-08-28 NOTE — Progress Notes (Addendum)
INITIAL NUTRITION ASSESSMENT  DOCUMENTATION CODES Per approved criteria  -Severe malnutrition in the context of chronic illness  Pt meets criteria for severe MALNUTRITION in the context of chronic illness as evidenced by PO intake < 75% for > one month, 9% body weight loss in one month.    INTERVENTION: -Recommend Ensure Complete po TID, each supplement provides 350 kcal and 13 grams of protein -Recommend Pro-Stat protein supplement BID, each supplement provides 100 kcal, and 15 gram protein -Recommend Beneprotein protein supplement BID, each supplement provides 25 kcal, and 6 gram protein -Consider appetite stimulant -Will continue to monitor   NUTRITION DIAGNOSIS: Inadequate oral intake related to dysphagia/decreased appetite as evidenced by PO intake < 75%, ongoing weight loss.   Goal: Pt to meet >/= 90% of their estimated nutrition needs    Monitor:  Total protein/energy intake, labs, weights, swallow profile, GOC  Reason for Assessment: MST  43 y.o. male  Admitting Dx: <principal problem not specified>  ASSESSMENT: Fred [name] is a 43 y.o. male With history of esophageal carcinoma with metastases with chemotherapy 2 weeks ago, hypertension, bilateral conjunctivitis, recent Pleurx catheter placement. Who presents today because of the above complaints. Reportedly this has progressively gotten worse in the last 2 or 3 days.   -Pt recently seen by outpatient oncology RD on 08/09/2013. Noted ongoing weight loss and dysphagia. RD encouraged pt to consume nutrition supplements and was educated on soft high kcal/protein snack/food ideas. Was given supplement coupons and nutrition education hand outs -Diet recall indicates pt continues with decreased appetite. Typically consume two Ensure Complete daily and has wife prepare milkshake/smoothie with protein powder and various fruits. Is unable to tolerate solid foods, but can eat small amount of fruit cups or jello -Has ongoing weight  loss. Pt reported usual body weight around 280 lbs. Previous medical records indicate a 20 lbs weight loss in one month (9% body weight loss, severe for time frame) -Pt willing to trial protein supplements, recommended Pro-Stat and Beneprotein to add to tolerable foods/fluids  Height: Ht Readings from Last 1 Encounters:  08/27/13 6' (1.829 m)    Weight: Wt Readings from Last 1 Encounters:  08/27/13 198 lb (89.812 kg)    Ideal Body Weight: 178 lbs  % Ideal Body Weight: 111  Wt Readings from Last 10 Encounters:  08/27/13 198 lb (89.812 kg)  08/19/13 194 lb 4.8 oz (88.134 kg)  08/09/13 203 lb 3.2 oz (92.171 kg)  08/06/13 218 lb (98.884 kg)  07/25/13 218 lb 1.9 oz (98.939 kg)  07/22/13 221 lb (100.245 kg)  07/18/13 221 lb 11.2 oz (100.562 kg)  07/11/13 225 lb 12 oz (102.4 kg)  07/11/13 223 lb 3.2 oz (101.243 kg)  07/04/13 219 lb 9.6 oz (99.61 kg)    Usual Body Weight: 280 lbs per pt report  % Usual Body Weight: 70%  BMI:  Body mass index is 26.85 kg/(m^2).  Estimated Nutritional Needs: Kcal: 2400-2600 Protein: 100-115 gram Fluid: >/=2400 ml/daily  Skin: WDL  Diet Order: General  EDUCATION NEEDS: -No education needs identified at this time   Intake/Output Summary (Last 24 hours) at 08/28/13 1327 Last data filed at 08/28/13 1007  Gross per 24 hour  Intake    240 ml  Output    700 ml  Net   -460 ml    Last BM: 8/24   Labs:   Recent Labs Lab 08/22/13 1556  08/27/13 1205 08/27/13 1221 08/27/13 2252 08/28/13 1000  NA 131*  --  133* 132*  --  129*  K 3.9  --  4.3 4.2  --  4.8  CL  --   --  96 96  --  93*  CO2 25  --  25  --   --  23  BUN 7.4  --  7 5*  --  9  CREATININE 0.7  < > 0.67 0.70 0.64 0.75  CALCIUM 8.1*  --  8.1*  --   --  8.0*  MG  --   --   --   --  1.5  --   PHOS  --   --   --   --  4.2  --   GLUCOSE 145*  --  116* 111*  --  101*  < > = values in this interval not displayed.  CBG (last 3)  No results found for this basename: GLUCAP,   in the last 72 hours  Scheduled Meds: . erythromycin  1 application Both Eyes QID  . feeding supplement (ENSURE COMPLETE)  237 mL Oral TID WC  . feeding supplement (PRO-STAT SUGAR FREE 64)  30 mL Oral BID WC  . fluconazole  100 mg Oral Daily  . gabapentin  100 mg Oral TID  . heparin  5,000 Units Subcutaneous 3 times per day  . metoprolol tartrate  25 mg Oral BID  . morphine  30 mg Oral Q12H  . protein supplement  1 scoop Oral BID WC  . sodium chloride  3 mL Intravenous Q12H    Continuous Infusions:   Past Medical History  Diagnosis Date  . GERD (gastroesophageal reflux disease)     cannot taste anything  . 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 Chest tube insertion Right 07/26/2013    Procedure: INSERTION PLEURAL DRAINAGE CATHETER;  Surgeon: Melrose Nakayama, MD;  Location: [place] [facility];  Service: Thoracic;  Laterality: Right;    Atlee Abide MS RD LDN Clinical Dietitian [phone]

## 2013-08-28 NOTE — Telephone Encounter (Signed)
Called and spoke multiple times with a patient's wife Larene Beach regarding a patient's continued, a progressive weakness.  Patient is apparently unable to ambulate and make his way out downstairs to come to the cancer clinic for further evaluation.  Patient's wife states that she did attempt a to move the patient from upstairs to downstairs with the assistance of family; but patient was unable to do so.  The patient's wife states that she then make a decision to call an ambulance for transfer to the emergency department for further evaluation.  Again called the emergency department charge nurse to give a full; but brief history.

## 2013-08-29 ENCOUNTER — Telehealth: Payer: Self-pay | Admitting: Medical Oncology

## 2013-08-29 DIAGNOSIS — C159 Malignant neoplasm of esophagus, unspecified: Secondary | ICD-10-CM | POA: Diagnosis not present

## 2013-08-29 DIAGNOSIS — R5383 Other fatigue: Secondary | ICD-10-CM | POA: Diagnosis not present

## 2013-08-29 DIAGNOSIS — R1310 Dysphagia, unspecified: Secondary | ICD-10-CM | POA: Diagnosis not present

## 2013-08-29 DIAGNOSIS — R5381 Other malaise: Secondary | ICD-10-CM | POA: Diagnosis not present

## 2013-08-29 NOTE — Progress Notes (Signed)
Right PleurX catheter drained per patient request.  Obtained 500 cc bloody fluid.  Patient tolerated well. Zandra Abts [facility]  08/29/2013  6:08 PM

## 2013-08-29 NOTE — Progress Notes (Signed)
Notified by Marisue Ivan, patient and family request services of [facility]) after discharge. Spoke with patient and wife Larene Beach at bedside to initiate education related to hospice services, philosophy and team approach to care; they voiced good understanding of information provided.  Per notes and discussion plan is to d/c by non-emergent transport tomorrow, Friday 08/29/13.  Discussed with patient's wife Larene Beach [facility] admission RN available to be at the home tomorrow at 4 pm if patient needs and discharge arrangements can be coordinated- she is agreeable to this.   Please send completed GOLD DNR form home on shadow chart with pt. *Per PMT NP note 08/28/13 recommendations for symptom management: Dulcolax Suppository, Ativan, MS Contin and Liquid Concentrated Morphine (Roxanol) 20 mg/ml prescriptions will be needed at discharge; please add "Dispense Quantity = 30 ml"  to be Liquid Morphine script as this is the quantity available in community pharmacy.  DME needs discussed and order requested with [facility] Equipment manager Gayland Curry who will contact [place] to arrange for Complete Pkg D: fully electric hospital bed with AP&P mattress, half rails and overbed table for delivery to the home today- *Please contact wife Larene Beach on cell # [phone] to arrange delivery time  Initial paperwork faxed to [facility]  Please notify [facility] when patient is ready to leave unit at d/c call [phone] (or [phone] if after 5 pm);  [facility] information and contact numbers also given to wife Larene Beach during visit.   Above information shared with Judson Roch [facility] Please call with any questions or concerns   Danton Sewer, RN 08/29/2013, 12:10 PM [facility] [place] [facility] Liaison [phone]

## 2013-08-29 NOTE — Progress Notes (Signed)
Pt was sitting up in bed and awake when I arrived. We talked about briefly about his family, pastor and church. Pt was grateful for visit and [facility] spt. Pls call if additional spt is needed. [place]  08/29/13 0900  Clinical Encounter Type  Visited With Patient

## 2013-08-29 NOTE — Progress Notes (Signed)
UR completed 

## 2013-08-29 NOTE — Progress Notes (Signed)
Patient ID: Fred [name], male   DOB: 1970/11/02, 43 y.o.   MRN: [id number]  [facility] PROGRESS NOTE  Fred [name] ZCH:[id number] DOB: Dec 20, 1970 DOA: 08/27/2013 PCP: [name], MD  Brief narrative:  43 y.o. male with esophageal carcinoma with metastases to lungs and abdomen, last chemotherapy 2 weeks ago, hypertension, bilateral conjunctivitis, recent Pleurx catheter placement who presented to [facility] ED with main concern of progressive failure to thrive.   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  - progressive  - pt and family in agreement with continuing to provide comfort and desire to to be discharged by Friday with hospice  - will ensure that comfort is provided  - appreciate oncology, radiation oncology, PCT assistance  - emotional support provided  - pt reports felling well this AM and denies any pain  Conjunctivitis, acute, bilateral  - eye drops as needed  Failure to thrive in adult  - secondary to progressive nature of the illness  - comfort feeding as pt able to tolerate  DNR (do not resuscitate) discussion  Acute on chronic blood loss anemia  - no transfusion desired at this time  - no further blood work to ensure comfort  Severe PCM  - secondary to the illness noted above   DVT prophylaxis  SCD  Code Status: DNR  Family Communication: Pt at bedside  Disposition Plan: Remains inpatient   IV Access:   Peripheral IV Procedures and diagnostic studies:   Ct Chest W Contrast Ct Abdomen Pelvis W Contrast 08/27/2013 Significant progression of metastatic disease in the chest and abdomen with marked progression of pleural metastatic disease in the right hemithorax and progression of peritoneal and omental metastatic disease in the upper abdomen in particular. 2. Large bilateral pleural effusions with passive atelectasis in the lower lobes. 3. 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 4. Mildly distended gallbladder containing echogenic bile, likely sludge. No calcified gallstones. Stable widespread osseous metastatic disease.  CXR 08/27/13 Worsening aeration, ? developing R paratracheal adenopathy, increasing B effusions.   Medical Consultants:   Oncology  Palliative care   Other Consultants:   None   Anti-Infectives:   Fluconazole    Fred Ramsay, MD  [facility] Pager [phone]  If 7PM-7AM, please contact night-coverage [email or web address] Password [email or web address] 08/29/2013, 9:25 AM   LOS: 2 days   HPI/Subjective: No events overnight.   Objective: Filed Vitals:   08/28/13 1412 08/28/13 2053 08/28/13 2105 08/29/13 0452  BP: 105/71 119/73  99/70  Pulse: 116 114 110 129  Temp: 97.9 F (36.6 C) 97.8 F (36.6 C)  98 F (36.7 C)  TempSrc: Oral Oral  Oral  Resp: 16 18  18   Height:      Weight:      SpO2: 97% 99%  94%    Intake/Output Summary (Last 24 hours) at 08/29/13 7209 Last data filed at 08/29/13 0453  Gross per 24 hour  Intake   1282 ml  Output      0 ml  Net   1282 ml    Exam:   General:  Pt is alert, follows commands appropriately, not in acute distress  Cardiovascular: Regular rhythm, tachycardic, S1/S2, no murmurs, no rubs, no gallops  Respiratory: Clear to auscultation bilaterally, no wheezing, no crackles, no rhonchi  Abdomen: Soft, non tender, non distended, bowel sounds present, no guarding  Data Reviewed: Basic Metabolic Panel:  Recent Labs Lab 08/22/13  1556 08/27/13 1205 08/27/13 1221 08/27/13 2252 08/28/13 1000  NA 131* 133* 132*  --  129*  K 3.9 4.3 4.2  --  4.8  CL  --  96 96  --  93*  CO2 25 25  --   --  23  GLUCOSE 145* 116* 111*  --  101*  BUN 7.4 7 5*  --  9  CREATININE 0.7 0.67 0.70 0.64 0.75  CALCIUM 8.1* 8.1*  --   --  8.0*  MG  --   --   --  1.5  --   PHOS  --   --   --  4.2  --    Liver Function Tests:  Recent Labs Lab 08/22/13 1556 08/27/13 1205 08/28/13 1000   AST 58* 46* 44*  ALT 15 14 14   ALKPHOS 176* 130* 121*  BILITOT 1.14 1.0 1.0  PROT 5.7* 5.5* 5.4*  ALBUMIN 2.2* 1.9* 2.0*   CBC:  Recent Labs Lab 08/22/13 1556 08/27/13 1205 08/27/13 1221 08/27/13 2252  WBC 16.1* 13.2*  --  11.0*  NEUTROABS 14.0* 10.4*  --   --   HGB 9.2* 8.3* 10.5* 7.9*  HCT 28.9* 25.8* 31.0* 24.6*  MCV 91.2 92.8  --  93.9  PLT 171 222  --  200   Cardiac Enzymes:  Recent Labs Lab 08/27/13 1205  TROPONINI <0.30    Recent Results (from the past 240 hour(s))  CULTURE, BLOOD (ROUTINE X 2)     Status: None   Collection Time    08/27/13 12:05 PM      Result Value Ref Range Status   Specimen Description BLOOD LEFT WRIST   Final   Special Requests BOTTLES DRAWN AEROBIC AND ANAEROBIC 3ML   Final   Culture  Setup Time     Final   Value: 08/27/2013 14:28     Performed at [facility]   Culture     Final   Value:        BLOOD CULTURE RECEIVED NO GROWTH TO DATE CULTURE WILL BE HELD FOR 5 DAYS BEFORE ISSUING A FINAL NEGATIVE REPORT     Performed at [facility]   Report Status PENDING   Incomplete  CULTURE, BLOOD (ROUTINE X 2)     Status: None   Collection Time    08/27/13 12:07 PM      Result Value Ref Range Status   Specimen Description BLOOD LEFT HAND   Final   Special Requests BOTTLES DRAWN AEROBIC ONLY 4ML   Final   Culture  Setup Time     Final   Value: 08/27/2013 14:28     Performed at [facility]   Culture     Final   Value:        BLOOD CULTURE RECEIVED NO GROWTH TO DATE CULTURE WILL BE HELD FOR 5 DAYS BEFORE ISSUING A FINAL NEGATIVE REPORT     Performed at [facility]   Report Status PENDING   Incomplete  URINE CULTURE     Status: None   Collection Time    08/27/13  4:56 PM      Result Value Ref Range Status   Specimen Description URINE, RANDOM   Final   Special Requests NONE   Final   Culture  Setup Time     Final   Value: 08/27/2013 23:43     Performed at [facility]     Final    Value: NO  GROWTH     Performed at [facility]   Culture     Final   Value: NO GROWTH     Performed at [facility]   Report Status 08/28/2013 FINAL   Final     Scheduled Meds: . erythromycin  1 application Both Eyes QID  . fluconazole  100 mg Oral Daily  . gabapentin  100 mg Oral TID  . metoprolol tartrate  25 mg Oral BID  . morphine  30 mg Oral Q12H  . protein supplement  1 scoop Oral BID WC  . sodium chloride  3 mL Intravenous Q12H   Continuous Infusions:

## 2013-08-29 NOTE — Telephone Encounter (Signed)
Infomred Fred [name] that Fred [name] will be attending with [facility] provider doing symptom management

## 2013-08-30 ENCOUNTER — Ambulatory Visit: Payer: BC Managed Care – PPO

## 2013-08-30 ENCOUNTER — Other Ambulatory Visit: Payer: BC Managed Care – PPO

## 2013-08-30 ENCOUNTER — Ambulatory Visit: Payer: BC Managed Care – PPO | Admitting: Internal Medicine

## 2013-08-30 DIAGNOSIS — C159 Malignant neoplasm of esophagus, unspecified: Secondary | ICD-10-CM | POA: Diagnosis not present

## 2013-08-30 DIAGNOSIS — R5381 Other malaise: Secondary | ICD-10-CM | POA: Diagnosis not present

## 2013-08-30 DIAGNOSIS — R1310 Dysphagia, unspecified: Secondary | ICD-10-CM | POA: Diagnosis not present

## 2013-08-30 MED ORDER — MORPHINE SULFATE (CONCENTRATE) 10 MG /0.5 ML PO SOLN: 20.0000 mg | ORAL | Status: DC | PRN

## 2013-08-30 MED ORDER — OXYCODONE HCL 5 MG PO TABS: 5.0000 mg | ORAL_TABLET | Freq: Four times a day (QID) | ORAL | Status: AC | PRN

## 2013-08-30 MED ORDER — MORPHINE SULFATE ER 30 MG PO TBCR: 30.0000 mg | EXTENDED_RELEASE_TABLET | Freq: Two times a day (BID) | ORAL | Status: AC

## 2013-08-30 MED ORDER — LORAZEPAM 1 MG PO TABS: 1.0000 mg | ORAL_TABLET | Freq: Four times a day (QID) | ORAL | Status: AC | PRN

## 2013-08-30 MED ORDER — HEPARIN SOD (PORK) LOCK FLUSH 100 UNIT/ML IV SOLN
500.0000 [IU] | INTRAVENOUS | Status: DC | PRN
  Filled 2013-08-30: qty 5

## 2013-08-30 NOTE — Discharge Instructions (Signed)
Morphine oral solution What is this medicine? MORPHINE (MOR feen) is a pain reliever. It is used to treat moderate to severe pain. This medicine may be used for other purposes; ask your health care provider or pharmacist if you have questions. COMMON BRAND NAME(S): MSIR, Roxanol, Roxanol-T What should I tell my health care provider before I take this medicine? They need to know if you have any of these conditions: -brain tumor -drug abuse or addiction -head injury -heart disease -if you frequently drink alcohol-containing drinks -intestinal disease -kidney disease or problems urinating -kyphoscoliosis -liver disease -lung disease, asthma, or breathing problems -taken isocarboxazid, phenelzine, tranylcypromine, or selegiline in the past 2 weeks -seizures -an unusual or allergic reaction to morphine, other medicines, foods, dyes, or preservatives -pregnant or trying to get pregnant -breast-feeding How should I use this medicine? Take this medicine by mouth. Follow the directions on the prescription label. Use a specially marked spoon or container to measure each dose. Ask your pharmacist if you do not have one. Household spoons are not accurate. If the medicine upsets your stomach, take it with food or milk. Take your medicine at regular intervals. Do not take it more often than directed. Do not stop taking except on your doctor's advice. A special MedGuide will be given to you by the pharmacist with each prescription and refill. Be sure to read this information carefully each time. Talk to your pediatrician regarding the use of this medicine in children. Special care may be needed. Overdosage: If you think you have taken too much of this medicine contact a poison control center or emergency room at once. NOTE: This medicine is only for you. Do not share this medicine with others. What if I miss a dose? If you miss a dose, take it as soon as you can. If it is almost time for your next  dose, take only that dose. Do not take double or extra doses. What may interact with this medicine? Do not take this medicine with any of the following medications: -MAOIs like Carbex, Eldepryl, Marplan, Nardil, and Parnate This medicine may also interact with the following medications: -alcohol -antihistamines -barbiturates, like phenobarbital -medicines for depression, anxiety, or psychotic disturbances -medicines for sleep -muscle relaxants -naltrexone, naloxone -narcotic medicines (opiates) for pain -rifampin -tramadol This list may not describe all possible interactions. Give your health care provider a list of all the medicines, herbs, non-prescription drugs, or dietary supplements you use. Also tell them if you smoke, drink alcohol, or use illegal drugs. Some items may interact with your medicine. What should I watch for while using this medicine? Tell your doctor or health care professional if your pain does not go away, if it gets worse, or if you have new or a different type of pain. You may develop tolerance to the medicine. Tolerance means that you will need a higher dose of the medicine for pain relief. Tolerance is normal and is expected if you take this medicine for a long time. Do not suddenly stop taking your medicine because you may develop a severe reaction. Your body becomes used to the medicine. This does NOT mean you are addicted. Addiction is a behavior related to getting and using a drug for a non-medical reason. If you have pain, you have a medical reason to take pain medicine. Your doctor will tell you how much medicine to take. If your doctor wants you to stop the medicine, the dose will be slowly lowered over time to avoid any side effects.  You may get drowsy or dizzy when you first start taking the medicine or change doses. Do not drive, use machinery, or do anything that may be dangerous until you know how the medicine affects you. Stand or sit up slowly. There are  different types of narcotic medicines (opiates) for pain. If you take more than one type at the same time, you may have more side effects. Give your health care provider a list of all medicines you use. Your doctor will tell you how much medicine to take. Do not take more medicine than directed. Call emergency for help if you have problems breathing. This medicine will cause constipation. Try to have a bowel movement at least every 2 to 3 days. If you do not have a bowel movement for 3 days, call your doctor or health care professional. Your mouth may get dry. Drinking water, chewing sugarless gum, or sucking on hard candy may help. See your dentist every 6 months. What side effects may I notice from receiving this medicine? Side effects that you should report to your doctor or health care professional as soon as possible: -allergic reactions like skin rash, itching or hives, swelling of the face, lips, or tongue -breathing problems -change in the amount of urine -confusion -feeling faint or lightheaded -fever, chills -hallucinations -seizures -slow or fast heartbeat -unusually weak or tired Side effects that usually do not require medical attention (report to your doctor or health care professional if they continue or are bothersome): -constipation -dizziness -headache -nausea, vomiting -pinpoint pupils -sweating This list may not describe all possible side effects. Call your doctor for medical advice about side effects. You may report side effects to FDA at 1-800-FDA-1088. Where should I keep my medicine? Keep out of the reach of children. This medicine can be abused. Keep your medicine in a safe place to protect it from theft. Do not share this medicine with anyone. Selling or giving away this medicine is dangerous and is against the law. Store at room temperature between 15 and 30 degrees C (59 and 86 degrees F). Keep container tightly closed. This medicine may cause accidental overdose  and death if it is taken by other adults, children, or pets. Flush any unused medicine down the toilet to reduce the chance of harm. Do not use the medicine after the expiration date. NOTE: This sheet is a summary. It may not cover all possible information. If you have questions about this medicine, talk to your doctor, pharmacist, or health care provider.  2015, Elsevier/Gold Standard. (2012-07-24 12:27:54)

## 2013-08-30 NOTE — Progress Notes (Signed)
CSW received notification from RN that pt needing ambulance transport home as pt returning home with hospice.  CSW met with pt wife at bedside, pt sleeping at this time. CSW confirmed address.  CSW arranged ambulance transport for pt scheduled for 1 pm via [facility].   RN aware.   No further social work needs identified at this time.  CSW signing off.   Alison Murray, MSW, [facility] [phone]

## 2013-08-30 NOTE — Progress Notes (Signed)
UR completed 

## 2013-08-30 NOTE — Progress Notes (Signed)
Pt discharged home via [facility] with spouse. Discharge instructions and scripts given. Pt and wife verbalized understanding.

## 2013-08-30 NOTE — Discharge Summary (Signed)
Physician Discharge Summary  Fred [name] INO:[id number] DOB: 05-Dec-1970 DOA: 08/27/2013  PCP: [name], MD  Admit date: 08/27/2013 Discharge date: 08/30/2013  Recommendations for Outpatient Follow-up:  1. Please drain pleurex cath as needed  Discharge Diagnoses:  Active Problems:   Esophageal cancer   Conjunctivitis, acute, bilateral   Fatigue   Failure to thrive in adult   Palliative care encounter   Weakness generalized   DNR (do not resuscitate) discussion  Discharge Condition: Stable  Diet recommendation: Heart healthy diet discussed in details   Brief narrative:  43 y.o. 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  - progressive  - pt and family in agreement with continuing to provide comfort and desire to to be discharged by Friday with hospice  - ensured that comfort is provided  - appreciate oncology, radiation oncology, PCT assistance  - emotional support provided  - pt reports felling well this AM and denies any pain  Conjunctivitis, acute, bilateral  - eye drops as needed  Failure to thrive in adult  - secondary to progressive nature of the illness  - comfort feeding as pt able to tolerate  DNR (do not resuscitate) discussion  Acute on chronic blood loss anemia  - no transfusion desired at this time  - no further blood work to ensure comfort  Severe PCM  - secondary to the illness noted above    Code Status: DNR  Family Communication: Pt at bedside   IV Access:   Peripheral IV Procedures and diagnostic studies:   Ct Chest W Contrast Ct Abdomen Pelvis W Contrast 08/27/2013 Significant progression of metastatic disease in the chest and abdomen  with marked progression of pleural metastatic disease in the right hemithorax and progression of peritoneal and omental metastatic disease in the upper abdomen in particular. 2. Large bilateral pleural effusions with passive atelectasis in the lower lobes. 3. 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 4. Mildly distended gallbladder containing echogenic bile, likely sludge. No calcified gallstones. Stable widespread osseous metastatic disease.  CXR 08/27/13 Worsening aeration, ? developing R paratracheal adenopathy, increasing B effusions.   Medical Consultants:   Oncology  Palliative care   Other Consultants:   None   Anti-Infectives:   Fluconazole   Discharge Exam: Filed Vitals:   08/30/13 0625  BP: 97/70  Pulse: 108  Temp: 97.6 F (36.4 C)  Resp: 16   Filed Vitals:   08/29/13 0452 08/29/13 1601 08/29/13 2145 08/30/13 0625  BP: 99/70 103/65  97/70  Pulse: 129 105 108 108  Temp: 98 F (36.7 C) 98.2 F (36.8 C)  97.6 F (36.4 C)  TempSrc: Oral Oral  Oral  Resp: 18 17  16   Height:      Weight:      SpO2: 94% 97%  95%    General: Pt is alert, follows commands appropriately, not in acute distress Cardiovascular: Regular rhythm, tachycardic, S1/S2 +, no murmurs, no rubs, no gallops Respiratory: Diminished breath sounds at bases   Discharge Instructions  Discharge Instructions   Diet - low sodium heart healthy    Complete by:  As directed      Increase activity slowly    Complete by:  As directed             Medication List         erythromycin ophthalmic ointment  Place 1 application into both eyes 4 (four) times daily.     fluconazole 10 MG/ML suspension  Commonly known as:  DIFLUCAN  Take 10 mLs (100 mg total) by mouth daily. Take 200 mg on first day     gabapentin 100 MG capsule  Commonly known as:  NEURONTIN  Take 1 capsule (100 mg total) by mouth 3 (three) times daily.      lidocaine-prilocaine cream  Commonly known as:  EMLA  Apply 1 application topically as needed (for port access (skin numbing)). Apply to port 1 hr before chemo     LORazepam 1 MG tablet  Commonly known as:  ATIVAN  Take 1 tablet (1 mg total) by mouth every 6 (six) hours as needed for anxiety.     metoprolol tartrate 25 MG tablet  Commonly known as:  LOPRESSOR  Take 1 tablet (25 mg total) by mouth 2 (two) times daily.     morphine 30 MG 12 hr tablet  Commonly known as:  MS CONTIN  Take 1 tablet (30 mg total) by mouth every 12 (twelve) hours.     morphine CONCENTRATE 10 mg / 0.5 ml concentrated solution  Take 1 mL (20 mg total) by mouth every 2 (two) hours as needed for moderate pain or shortness of breath. Take 5 mg by mouth every 2 hours as needed for pain or shortness of breath     ondansetron 8 MG disintegrating tablet  Commonly known as:  ZOFRAN-ODT  Take 1 tablet (8 mg total) by mouth every 8 (eight) hours as needed for nausea or vomiting.     oxyCODONE 5 MG immediate release tablet  Commonly known as:  Oxy IR/ROXICODONE  Take 1-2 tablets (5-10 mg total) by mouth every 6 (six) hours as needed for severe pain or breakthrough pain.     simethicone 80 MG chewable tablet  Commonly known as:  MYLICON  Chew 559 mg by mouth 4 (four) times daily as needed for flatulence.           Follow-up Information   [facility] with [facility]   Contact information:   [place] [address] [phone]      Follow up with [name], MD.   Specialty:  Family Medicine   Contact information:   [place] [address] [address] [phone]       Follow up with Faye Ramsay, MD. (call my cell phone as needed [phone])    Specialty:  Internal Medicine   Contact information:   [address] [place] [address] [phone]        The results of significant diagnostics from  this hospitalization (including imaging, microbiology, ancillary and laboratory) are listed below for reference.     Microbiology: Recent Results (from the past 240 hour(s))  CULTURE, BLOOD (ROUTINE X 2)     Status: None   Collection Time    08/27/13 12:05 PM      Result Value Ref Range Status   Specimen Description BLOOD LEFT WRIST   Final   Special Requests BOTTLES DRAWN AEROBIC AND ANAEROBIC 3ML   Final   Culture  Setup Time     Final   Value: 08/27/2013 14:28     Performed at [facility]  Culture     Final   Value:        BLOOD CULTURE RECEIVED NO GROWTH TO DATE CULTURE WILL BE HELD FOR 5 DAYS BEFORE ISSUING A FINAL NEGATIVE REPORT     Performed at [facility]   Report Status PENDING   Incomplete  CULTURE, BLOOD (ROUTINE X 2)     Status: None   Collection Time    08/27/13 12:07 PM      Result Value Ref Range Status   Specimen Description BLOOD LEFT HAND   Final   Special Requests BOTTLES DRAWN AEROBIC ONLY 4ML   Final   Culture  Setup Time     Final   Value: 08/27/2013 14:28     Performed at [facility]   Culture     Final   Value:        BLOOD CULTURE RECEIVED NO GROWTH TO DATE CULTURE WILL BE HELD FOR 5 DAYS BEFORE ISSUING A FINAL NEGATIVE REPORT     Performed at [facility]   Report Status PENDING   Incomplete  URINE CULTURE     Status: None   Collection Time    08/27/13  4:56 PM      Result Value Ref Range Status   Specimen Description URINE, RANDOM   Final   Special Requests NONE   Final   Culture  Setup Time     Final   Value: 08/27/2013 23:43     Performed at [facility]     Final   Value: NO GROWTH     Performed at [facility]   Culture     Final   Value: NO GROWTH     Performed at [facility]   Report Status 08/28/2013 FINAL   Final     Labs: Basic Metabolic Panel:  Recent Labs Lab 08/27/13 1205 08/27/13 1221 08/27/13 2252 08/28/13 1000  NA 133* 132*  --  129*  K 4.3  4.2  --  4.8  CL 96 96  --  93*  CO2 25  --   --  23  GLUCOSE 116* 111*  --  101*  BUN 7 5*  --  9  CREATININE 0.67 0.70 0.64 0.75  CALCIUM 8.1*  --   --  8.0*  MG  --   --  1.5  --   PHOS  --   --  4.2  --    Liver Function Tests:  Recent Labs Lab 08/27/13 1205 08/28/13 1000  AST 46* 44*  ALT 14 14  ALKPHOS 130* 121*  BILITOT 1.0 1.0  PROT 5.5* 5.4*  ALBUMIN 1.9* 2.0*  CBC:  Recent Labs Lab 08/27/13 1205 08/27/13 1221 08/27/13 2252  WBC 13.2*  --  11.0*  NEUTROABS 10.4*  --   --   HGB 8.3* 10.5* 7.9*  HCT 25.8* 31.0* 24.6*  MCV 92.8  --  93.9  PLT 222  --  200   Cardiac Enzymes:  Recent Labs Lab 08/27/13 1205  TROPONINI <0.30   BNP: BNP (last 3 results)  Recent Labs  07/11/13 1830 07/22/13 1159  PROBNP 111.5 174.1*    SIGNED: Time coordinating discharge: Over 30 minutes  Faye Ramsay, MD  [facility] 08/30/2013, 10:10 AM Pager [phone]  If 7PM-7AM, please contact night-coverage [email or web address] Password [email or web address]

## 2013-08-31 ENCOUNTER — Ambulatory Visit: Payer: BC Managed Care – PPO

## 2013-09-02 LAB — CULTURE, BLOOD (ROUTINE X 2)
Culture: NO GROWTH
Culture: NO GROWTH

## 2013-09-03 ENCOUNTER — Ambulatory Visit: Payer: BC Managed Care – PPO | Admitting: Thoracic Surgery (Cardiothoracic Vascular Surgery)

## 2013-09-03 ENCOUNTER — Other Ambulatory Visit: Payer: Self-pay | Admitting: Oncology

## 2013-09-06 ENCOUNTER — Other Ambulatory Visit: Payer: BC Managed Care – PPO

## 2013-09-10 ENCOUNTER — Other Ambulatory Visit: Payer: Self-pay | Admitting: Medical Oncology

## 2013-09-10 ENCOUNTER — Telehealth: Payer: Self-pay | Admitting: Medical Oncology

## 2013-09-10 DIAGNOSIS — C7951 Secondary malignant neoplasm of bone: Secondary | ICD-10-CM

## 2013-09-10 DIAGNOSIS — C159 Malignant neoplasm of esophagus, unspecified: Secondary | ICD-10-CM

## 2013-09-10 MED ORDER — MORPHINE SULFATE (CONCENTRATE) 10 MG /0.5 ML PO SOLN: 10.0000 mg | ORAL | Status: AC | PRN

## 2013-09-10 MED ORDER — FENTANYL 50 MCG/HR TD PT72: 50.0000 ug | MEDICATED_PATCH | TRANSDERMAL | Status: AC

## 2013-09-10 NOTE — Telephone Encounter (Signed)
Trouble swallowing pills. He is able to take roxinol susp SL. Also nurse requests increase in pain med and increase frequency of ativan for increased agitation. Per Julien Nordmann he ordered Fentanyl 50 mcg patch q 72 hours and increased roxinol to 5-10 mg every 2 hours . He also increased ativan to every 3-4 hours . Mark notified.

## 2013-09-10 NOTE — Telephone Encounter (Signed)
Fred [name] called back . Dr Karie Georges said to hold fentanyl now and evaluate pain after roxinol dose increased. Carla asked me to call in [name] .I called in ativan 1 mg every 3-4 hours for restlessness, agitation for comfort.

## 2013-09-11 ENCOUNTER — Telehealth: Payer: Self-pay | Admitting: Medical Oncology

## 2013-09-13 ENCOUNTER — Other Ambulatory Visit: Payer: BC Managed Care – PPO

## 2013-09-18 ENCOUNTER — Telehealth: Payer: Self-pay | Admitting: *Deleted

## 2013-09-18 NOTE — Telephone Encounter (Signed)
Critical Illness Claim Form  from [employer] filled out by Dr Vista Mink, signed and faxed to [phone] per pt's wife request.  Mailed form back to pt's wife per her request.  Will scan copy of form into [employer] for out records.

## 2013-09-20 ENCOUNTER — Other Ambulatory Visit: Payer: BC Managed Care – PPO

## 2013-09-20 ENCOUNTER — Ambulatory Visit: Payer: BC Managed Care – PPO

## 2013-09-21 ENCOUNTER — Ambulatory Visit: Payer: BC Managed Care – PPO

## 2013-09-27 ENCOUNTER — Other Ambulatory Visit: Payer: BC Managed Care – PPO

## 2013-10-03 NOTE — Telephone Encounter (Signed)
Pt non communicative, restless continues but better - Not flailing, does pull hand away if anyone touches it. Occasional grimace .Request to increase ativan or morphine. Per Julien Nordmann I instructed Angela Nevin to titrate ativan and morphine to comfort . Ativan every 2 hours prn and morphine every 1 hour prn.

## 2013-10-03 DEATH — deceased

## 2013-10-04 ENCOUNTER — Other Ambulatory Visit: Payer: BC Managed Care – PPO

## 2013-10-11 ENCOUNTER — Other Ambulatory Visit: Payer: BC Managed Care – PPO

## 2013-10-12 ENCOUNTER — Ambulatory Visit: Payer: BC Managed Care – PPO

## 2013-10-18 ENCOUNTER — Other Ambulatory Visit: Payer: BC Managed Care – PPO

## 2013-10-25 ENCOUNTER — Other Ambulatory Visit: Payer: BC Managed Care – PPO

## 2013-11-01 ENCOUNTER — Other Ambulatory Visit: Payer: BC Managed Care – PPO

## 2013-11-02 ENCOUNTER — Ambulatory Visit: Payer: BC Managed Care – PPO

## 2015-04-19 IMAGING — CR DG CHEST 2V
2 series · 2 of 2 positions shown · non-contrast
Comparison: Chest x-ray 07/14/2013.

CLINICAL DATA: Shortness of breath.  Metastatic disease.

EXAM:
CHEST  2 VIEW

[w chest pa]
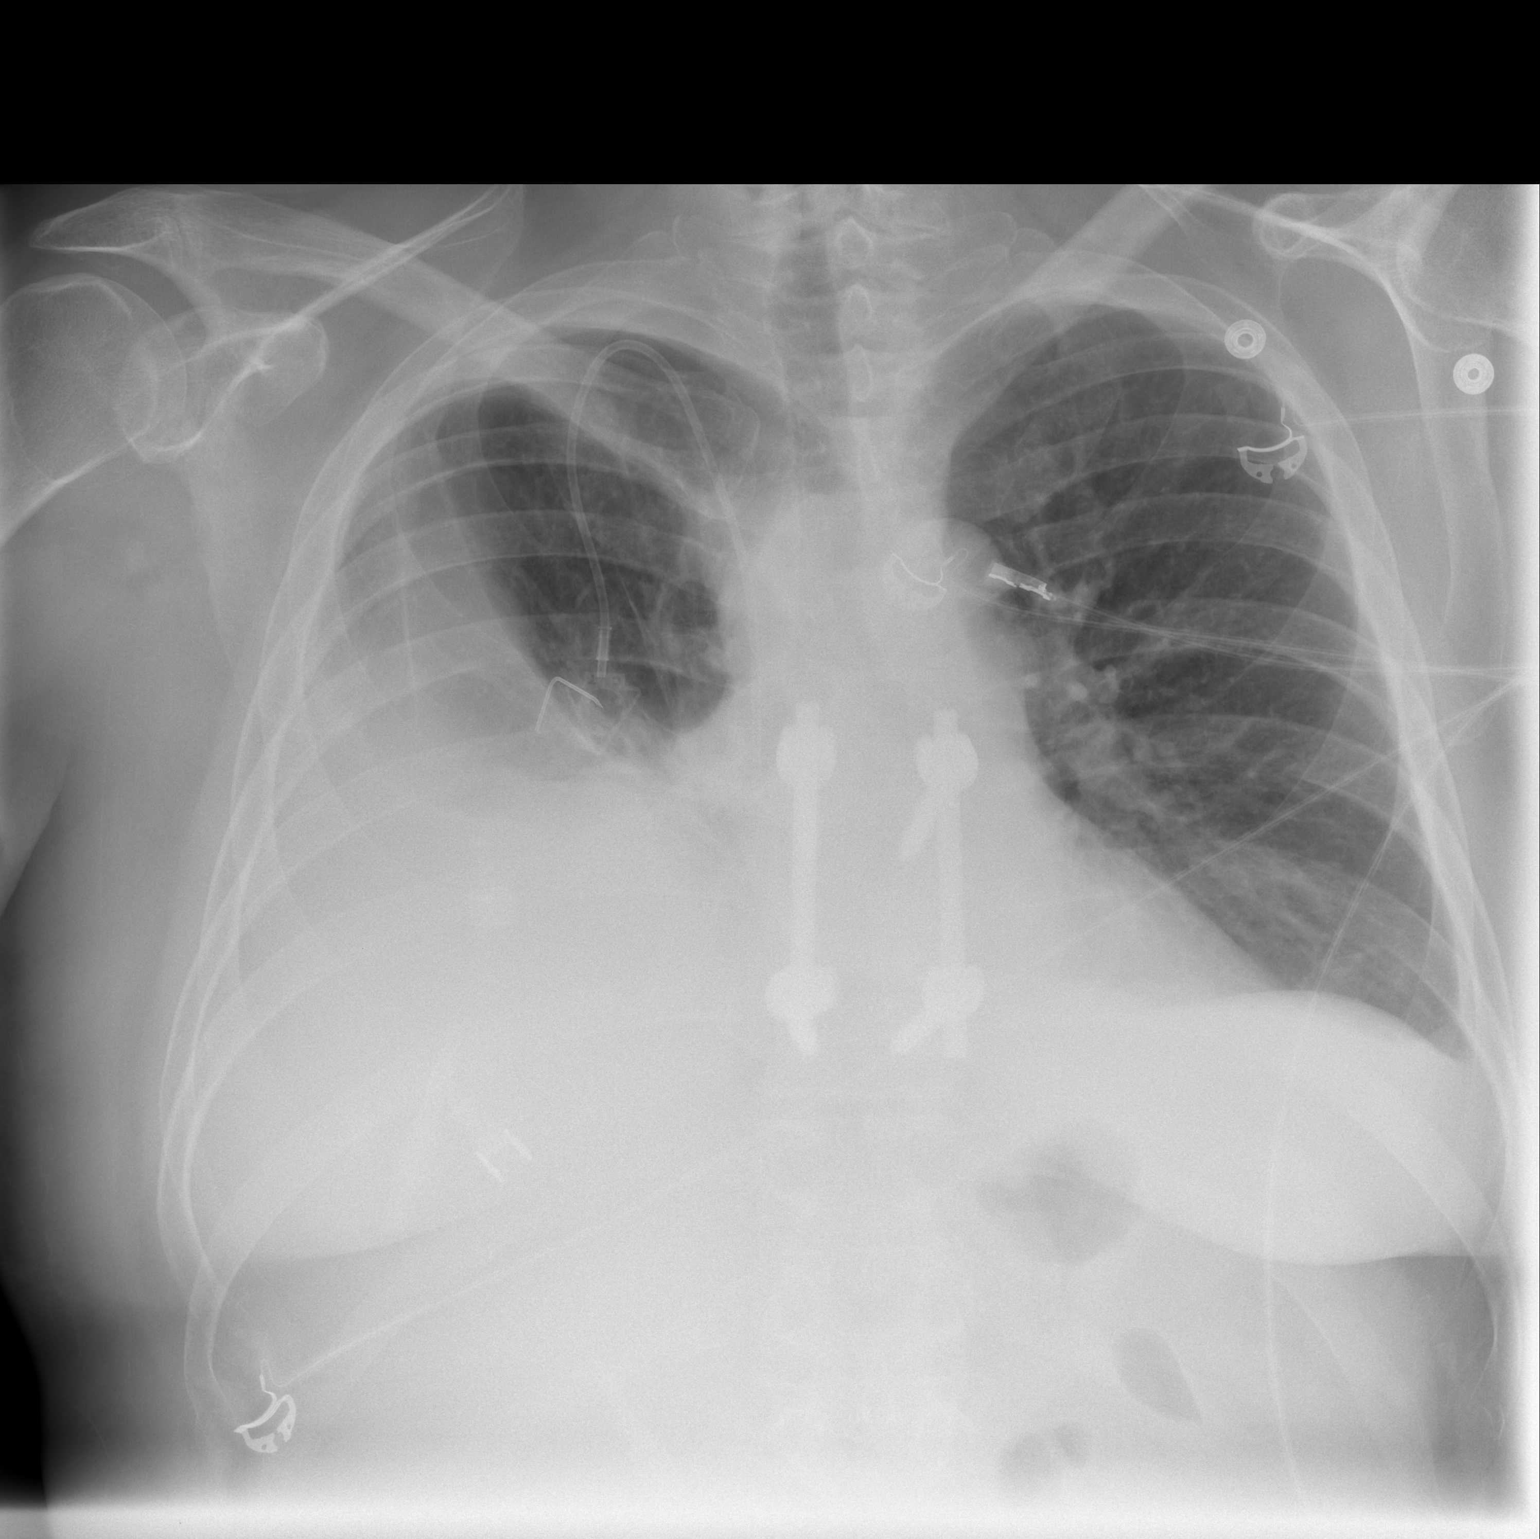

[w chest lat]
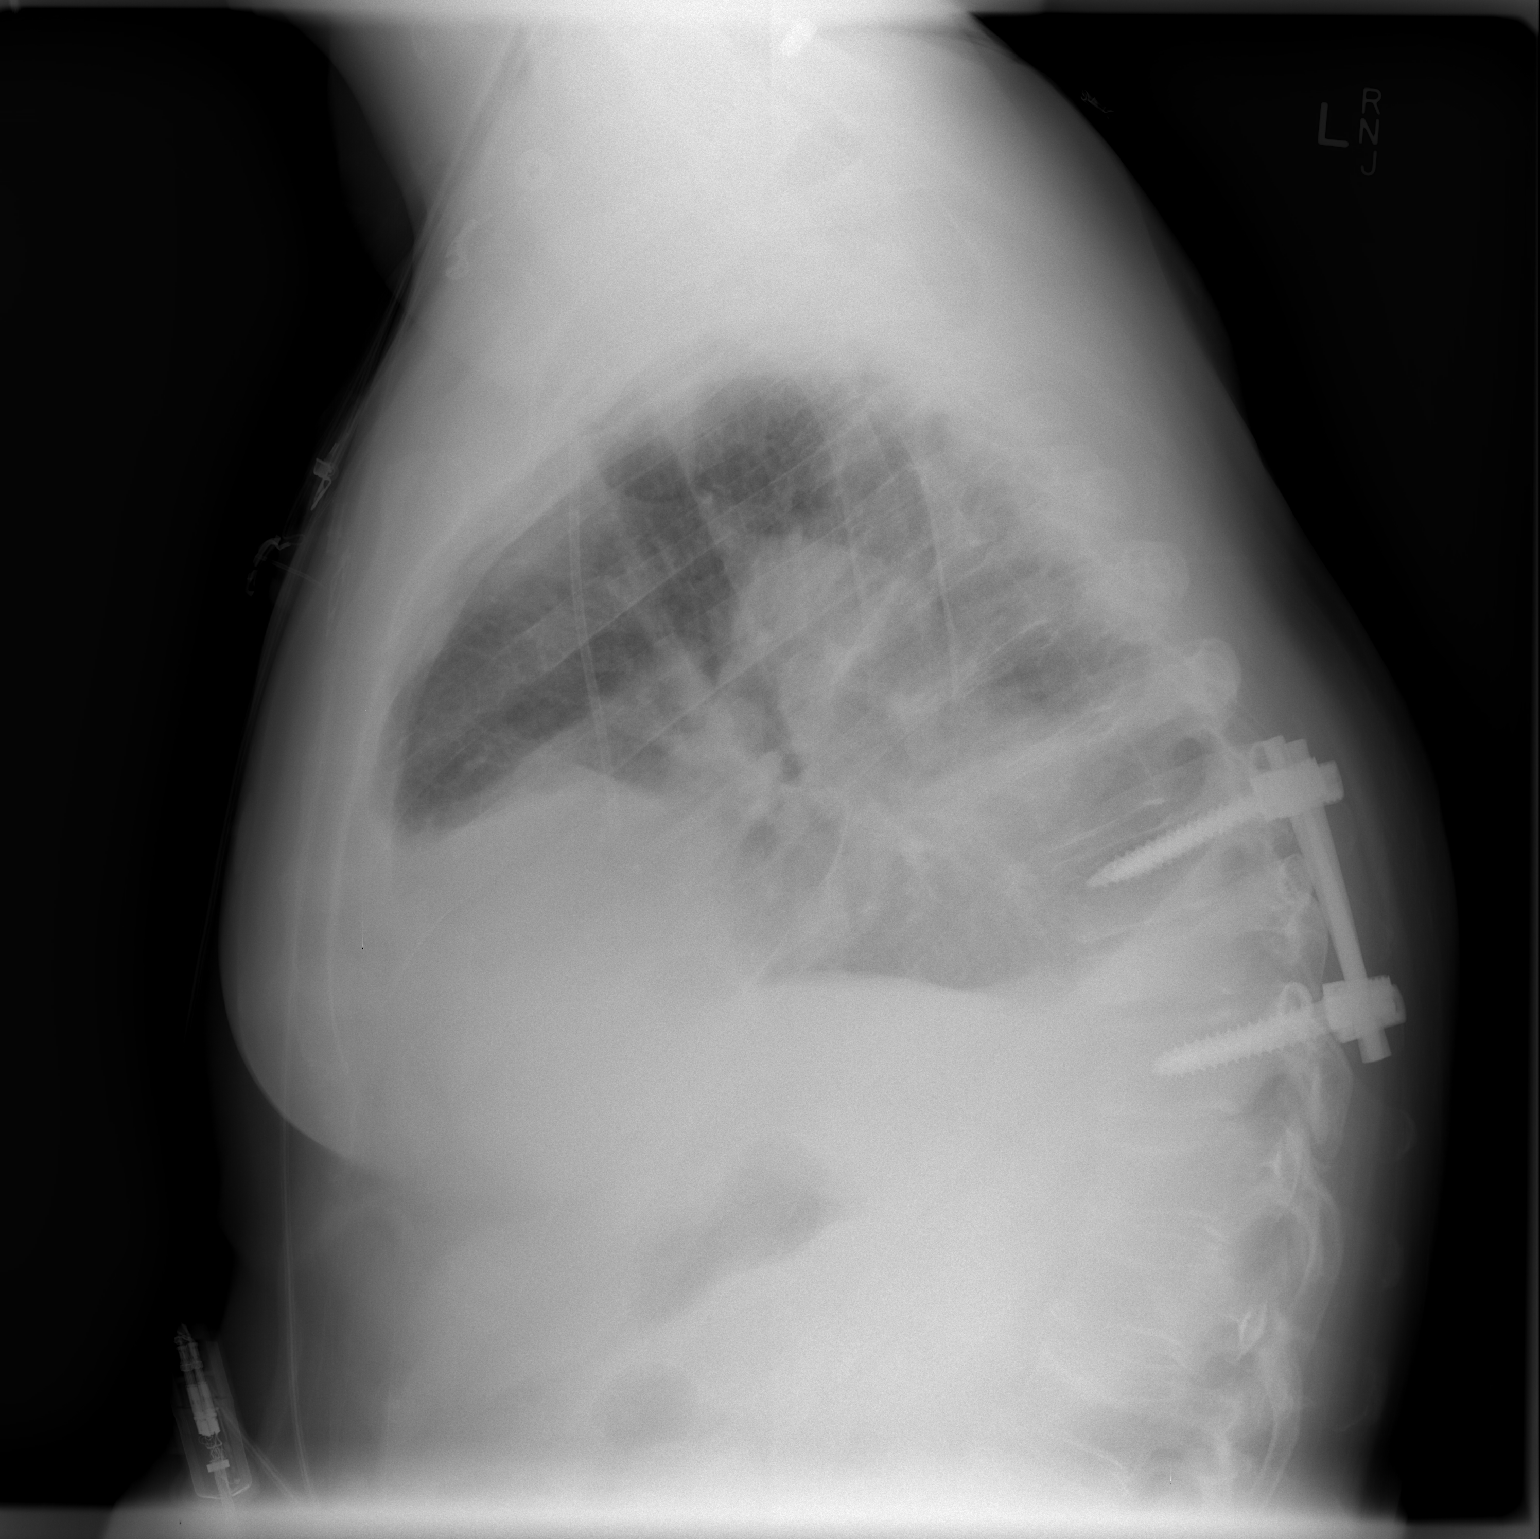

[2 of 2 positions shown; findings below may reference images not displayed]

FINDINGS: Power port noted with tip in superior vena cava. Right lower lobe
atelectasis and or infiltrate and right-sided pleural effusion again
noted. No interim change. No pneumothorax. Heart size stable. No
acute osseus abnormality. Prior thoracic spine fusion. Surgical
clips right upper quadrant.
IMPRESSION: Stable right lower lobe atelectasis and/or consolidation with stable
prominent right pleural effusion.

## 2015-04-20 IMAGING — DX DG CHEST 1V PORT
1 series · 1 of 1 positions shown · non-contrast
Comparison: 07/22/2013

CLINICAL DATA: Status post thoracentesis.

EXAM:
PORTABLE CHEST - 1 VIEW

[chest ap]
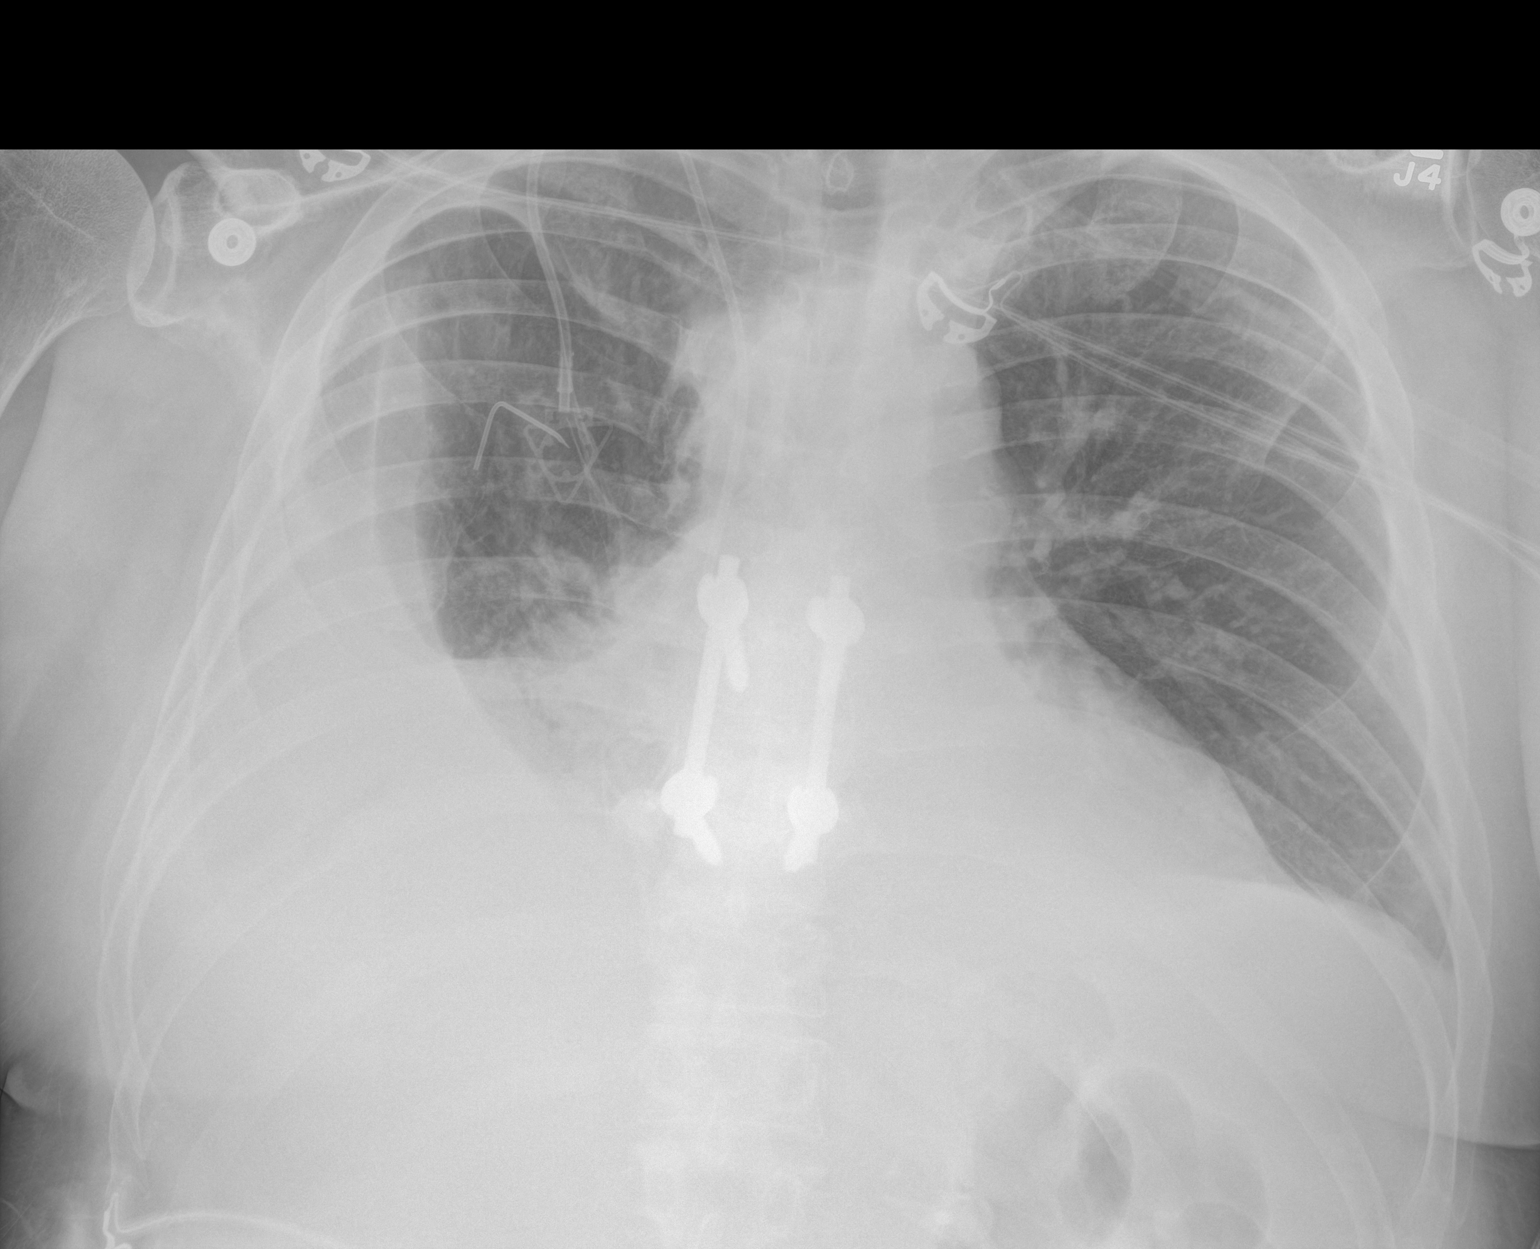

[1 of 1 positions shown; findings below may reference images not displayed]

FINDINGS: The power port is stable. There is a persistent moderate to large
right pleural effusion with overlying atelectasis. No pneumothorax.
The left lung is clear. No definite left-sided pleural effusion.
IMPRESSION: Stable moderate to large right pleural effusion with overlying
atelectasis. No pneumothorax.

## 2015-05-04 IMAGING — CR DG CHEST 2V
2 series · 2 of 2 positions shown · non-contrast
Comparison: 07/26/2013

CLINICAL DATA: Shortness of breath

EXAM:
CHEST  2 VIEW

[w chest pa]
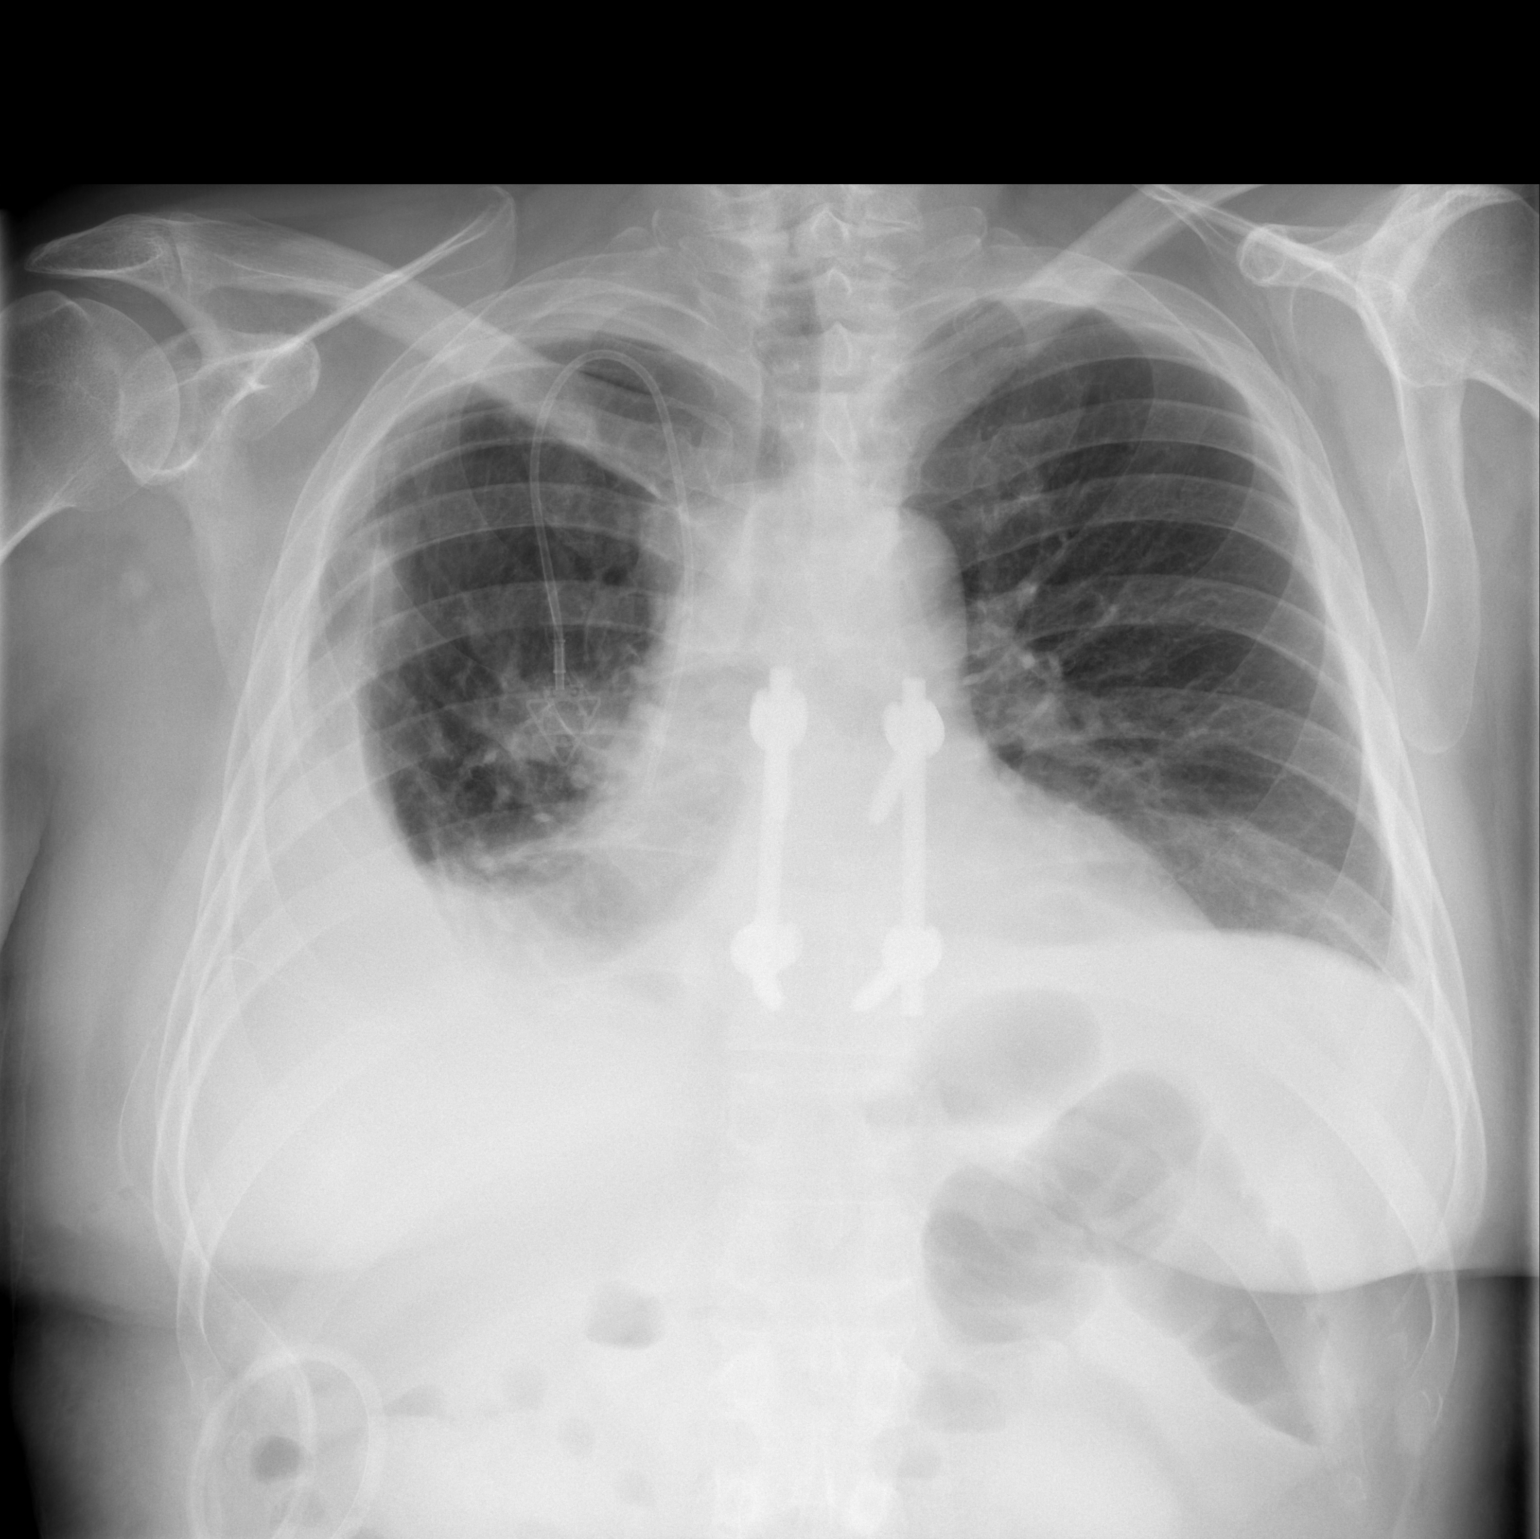

[w chest lat]
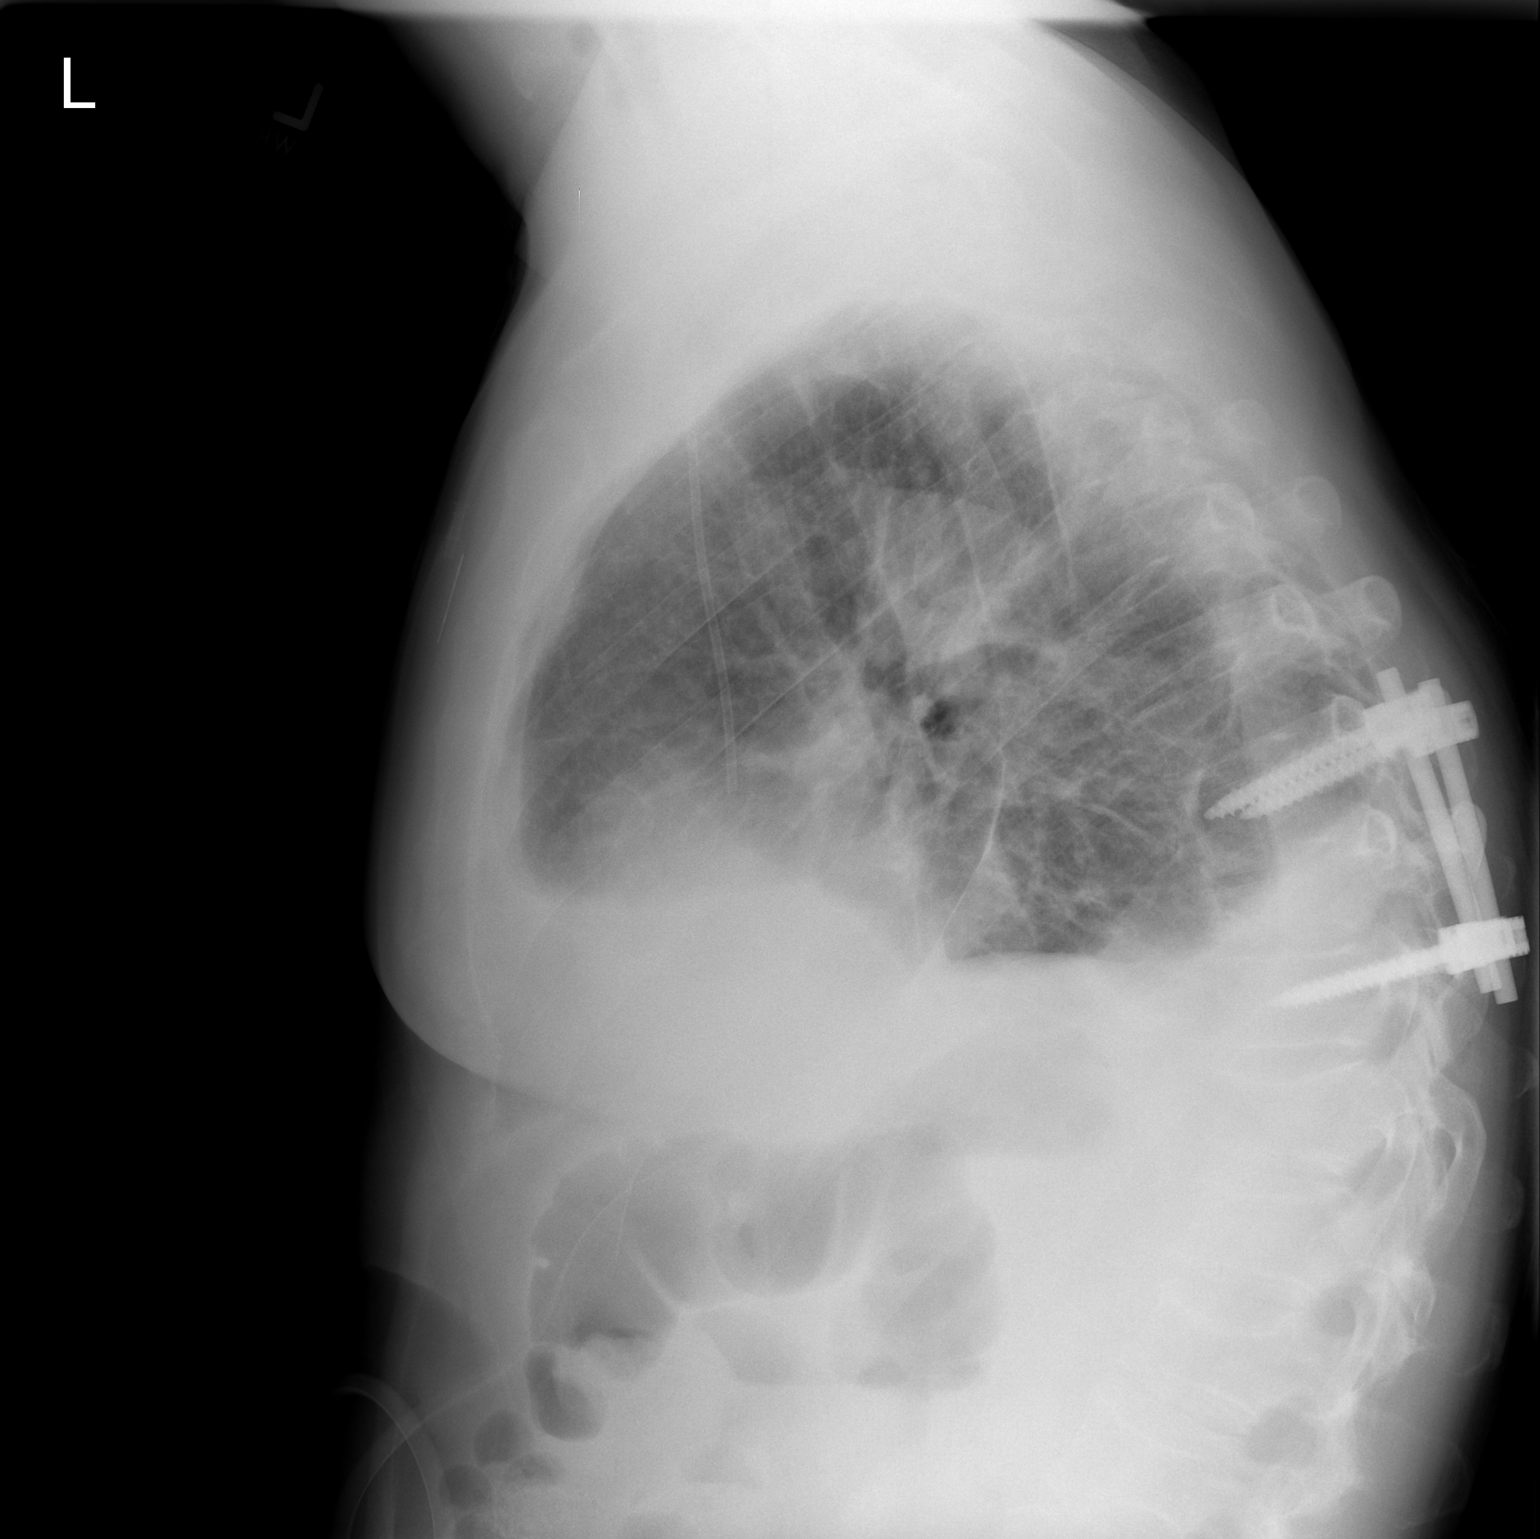

[2 of 2 positions shown; findings below may reference images not displayed]

FINDINGS: There is a stable large right-sided pleural effusion identified
similar to that seen on the prior exam. A right chest wall port is
again noted and stable. Postsurgical changes in the mid thoracic
spine are seen. Small left-sided pleural effusion is noted also
stable from the prior study.
IMPRESSION: Bilateral pleural effusions right greater than left stable from the
previous exam. No acute abnormality is noted.

## 2015-06-22 ENCOUNTER — Other Ambulatory Visit: Payer: Self-pay | Admitting: Nurse Practitioner
